# Patient Record
Sex: Female | Born: 1960
Health system: Southern US, Community
[De-identification: ages and names within clinical notes are randomized; demographics above are authoritative.]

## PROBLEM LIST (undated history)

## (undated) DIAGNOSIS — E7800 Pure hypercholesterolemia, unspecified: Secondary | ICD-10-CM

## (undated) DIAGNOSIS — K219 Gastro-esophageal reflux disease without esophagitis: Secondary | ICD-10-CM

## (undated) DIAGNOSIS — J45909 Unspecified asthma, uncomplicated: Secondary | ICD-10-CM

## (undated) DIAGNOSIS — M1990 Unspecified osteoarthritis, unspecified site: Secondary | ICD-10-CM

## (undated) DIAGNOSIS — E119 Type 2 diabetes mellitus without complications: Secondary | ICD-10-CM

## (undated) DIAGNOSIS — T7840XA Allergy, unspecified, initial encounter: Secondary | ICD-10-CM

## (undated) HISTORY — DX: Allergy, unspecified, initial encounter: T78.40XA

## (undated) HISTORY — DX: Unspecified asthma, uncomplicated: J45.909

## (undated) HISTORY — PX: CARPAL TUNNEL RELEASE: SHX101

## (undated) HISTORY — PX: BACK SURGERY: SHX140

## (undated) HISTORY — PX: KNEE SURGERY: SHX244

## (undated) HISTORY — DX: Gastro-esophageal reflux disease without esophagitis: K21.9

## (undated) HISTORY — PX: DILATION AND CURETTAGE, DIAGNOSTIC / THERAPEUTIC: SUR384

## (undated) HISTORY — PX: ANTERIOR CRUCIATE LIGAMENT REPAIR: SHX115

---

## 2011-10-22 ENCOUNTER — Other Ambulatory Visit (HOSPITAL_COMMUNITY): Payer: Self-pay | Admitting: Family Medicine

## 2011-10-22 DIAGNOSIS — Z1231 Encounter for screening mammogram for malignant neoplasm of breast: Secondary | ICD-10-CM

## 2011-10-23 ENCOUNTER — Other Ambulatory Visit (HOSPITAL_COMMUNITY): Payer: Self-pay | Admitting: Family Medicine

## 2011-11-18 ENCOUNTER — Ambulatory Visit (HOSPITAL_COMMUNITY): Payer: 59

## 2012-10-06 ENCOUNTER — Encounter (HOSPITAL_COMMUNITY): Payer: Self-pay | Admitting: *Deleted

## 2012-10-06 ENCOUNTER — Emergency Department (HOSPITAL_COMMUNITY)
Admission: EM | Admit: 2012-10-06 | Discharge: 2012-10-06 | Disposition: A | Discharge status: Home / Self Care | Payer: 59 | Source: Home / Self Care | Attending: Family Medicine | Admitting: Family Medicine

## 2012-10-06 DIAGNOSIS — S29012A Strain of muscle and tendon of back wall of thorax, initial encounter: Secondary | ICD-10-CM

## 2012-10-06 DIAGNOSIS — S239XXA Sprain of unspecified parts of thorax, initial encounter: Secondary | ICD-10-CM

## 2012-10-06 HISTORY — DX: Type 2 diabetes mellitus without complications: E11.9

## 2012-10-06 HISTORY — DX: Pure hypercholesterolemia, unspecified: E78.00

## 2012-10-06 MED ORDER — CYCLOBENZAPRINE HCL 5 MG PO TABS: 5.0000 mg | ORAL_TABLET | Freq: Three times a day (TID) | ORAL | Status: DC | PRN

## 2012-10-06 MED ORDER — KETOROLAC TROMETHAMINE 10 MG PO TABS: 10.0000 mg | ORAL_TABLET | Freq: Four times a day (QID) | ORAL | Status: DC | PRN

## 2012-10-06 NOTE — ED Provider Notes (Signed)
History     CSN: [id number]  Arrival date & time 10/06/12  1519   First MD Initiated Contact with Patient 10/06/12 1540      Chief Complaint  Patient presents with  . Back Pain    (Consider location/radiation/quality/duration/timing/severity/associated sxs/prior treatment) Patient is a 52 y.o. female presenting with back pain. The history is provided by the patient.  Back Pain Location:  Thoracic spine Quality:  Stabbing Radiates to:  Does not radiate Pain severity:  Moderate Onset quality:  Gradual (onset after working in yard over weekend cutting and pulling bushes etc. sx getting worse daily.) Progression:  Worsening Chronicity:  New Context: lifting heavy objects   Ineffective treatments:  NSAIDs Associated symptoms: no abdominal pain, no bladder incontinence, no fever, no numbness and no paresthesias     Past Medical History  Diagnosis Date  . Diabetes mellitus without complication   . Hypercholesteremia     Past Surgical History  Procedure Laterality Date  . Knee surgery    . Carpal tunnel release    . Back surgery      No family history on file.  History  Substance Use Topics  . Smoking status: Never Smoker   . Smokeless tobacco: Not on file  . Alcohol Use: Yes    OB History   Grav Para Term Preterm Abortions TAB SAB Ect Mult Living                  Review of Systems  Constitutional: Negative.  Negative for fever.  Gastrointestinal: Negative for abdominal pain.  Genitourinary: Negative for bladder incontinence.  Musculoskeletal: Positive for myalgias and back pain.  Skin: Negative.   Neurological: Negative for numbness and paresthesias.    Allergies  Review of patient's allergies indicates no known allergies.  Home Medications   Current Outpatient Rx  Name  Route  Sig  Dispense  Refill  . LISINOPRIL PO   Oral   Take by mouth.         . Loratadine (CLARITIN PO)   Oral   Take by mouth.         . METFORMIN HCL PO   Oral   Take  by mouth.         Marland Kitchen SIMVASTATIN PO   Oral   Take by mouth.         . cyclobenzaprine (FLEXERIL) 5 MG tablet   Oral   Take 1 tablet (5 mg total) by mouth 3 (three) times daily as needed for muscle spasms.   30 tablet   0   . ketorolac (TORADOL) 10 MG tablet   Oral   Take 1 tablet (10 mg total) by mouth every 6 (six) hours as needed for pain.   20 tablet   0     BP 102/59  Pulse 101  Temp(Src) 98.8 F (37.1 C) (Oral)  Resp 20  Physical Exam  Nursing note reviewed. Constitutional: She is oriented to person, place, and time. She appears well-developed and well-nourished.  Neck: Normal range of motion. Neck supple.  Musculoskeletal: She exhibits tenderness.       Arms: Neurological: She is alert and oriented to person, place, and time.  Skin: Skin is warm and dry.    ED Course  Procedures (including critical care time)  Labs Reviewed - No data to display No results found.   1. Strain of thoracic paraspinal muscles excluding T1 and T2 levels, initial encounter       MDM  Linna Hoff, MD 10/06/12 [phone]

## 2012-10-06 NOTE — ED Notes (Signed)
Pt  Reports    Mid  Back      Upper  Back  Pain     X  3    Days        -  Pt  States  Did  Some  Heavy  Yard  Work  Over  The  Weekend        denys  Any  specefic  Recent  Injury         Pt  Is  Worse  On movement and  posistion

## 2012-12-27 DIAGNOSIS — J4520 Mild intermittent asthma, uncomplicated: Secondary | ICD-10-CM | POA: Insufficient documentation

## 2013-01-02 ENCOUNTER — Other Ambulatory Visit (HOSPITAL_COMMUNITY): Payer: Self-pay | Admitting: Family Medicine

## 2013-01-03 ENCOUNTER — Other Ambulatory Visit (HOSPITAL_COMMUNITY): Payer: Self-pay | Admitting: Family Medicine

## 2013-01-03 DIAGNOSIS — R109 Unspecified abdominal pain: Secondary | ICD-10-CM

## 2013-01-03 DIAGNOSIS — R0781 Pleurodynia: Secondary | ICD-10-CM

## 2013-01-11 ENCOUNTER — Encounter (HOSPITAL_COMMUNITY)
Admission: RE | Admit: 2013-01-11 | Discharge: 2013-01-11 | Disposition: A | Discharge status: Home / Self Care | Payer: 59 | Source: Ambulatory Visit | Attending: Family Medicine | Admitting: Family Medicine

## 2013-01-11 DIAGNOSIS — R0781 Pleurodynia: Secondary | ICD-10-CM

## 2013-01-11 DIAGNOSIS — R109 Unspecified abdominal pain: Secondary | ICD-10-CM | POA: Insufficient documentation

## 2013-01-11 DIAGNOSIS — R079 Chest pain, unspecified: Secondary | ICD-10-CM | POA: Insufficient documentation

## 2013-01-11 MED ORDER — TECHNETIUM TC 99M MEDRONATE IV KIT
27.0000 | PACK | Freq: Once | INTRAVENOUS | Status: AC | PRN
  Administered 2013-01-11: 27 via INTRAVENOUS

## 2013-05-01 ENCOUNTER — Other Ambulatory Visit (HOSPITAL_COMMUNITY): Payer: Self-pay | Admitting: Obstetrics & Gynecology

## 2013-05-01 DIAGNOSIS — Z1231 Encounter for screening mammogram for malignant neoplasm of breast: Secondary | ICD-10-CM

## 2013-05-22 ENCOUNTER — Ambulatory Visit (HOSPITAL_COMMUNITY)
Admission: RE | Admit: 2013-05-22 | Discharge: 2013-05-22 | Disposition: A | Discharge status: Home / Self Care | Payer: 59 | Source: Ambulatory Visit | Attending: Obstetrics & Gynecology | Admitting: Obstetrics & Gynecology

## 2013-05-22 DIAGNOSIS — Z1231 Encounter for screening mammogram for malignant neoplasm of breast: Secondary | ICD-10-CM | POA: Insufficient documentation

## 2013-06-07 LAB — HM DIABETES EYE EXAM

## 2013-06-29 ENCOUNTER — Encounter: Payer: Self-pay | Admitting: *Deleted

## 2013-06-29 ENCOUNTER — Encounter: Payer: Self-pay | Admitting: Internal Medicine

## 2013-06-29 ENCOUNTER — Other Ambulatory Visit: Payer: Self-pay | Admitting: *Deleted

## 2013-06-29 ENCOUNTER — Ambulatory Visit (INDEPENDENT_AMBULATORY_CARE_PROVIDER_SITE_OTHER): Payer: 59 | Admitting: Internal Medicine

## 2013-06-29 VITALS — BP 118/68 | HR 103 | Temp 98.7°F | Resp 12 | Ht 63.5 in | Wt 239.0 lb

## 2013-06-29 DIAGNOSIS — IMO0001 Reserved for inherently not codable concepts without codable children: Secondary | ICD-10-CM

## 2013-06-29 DIAGNOSIS — E1169 Type 2 diabetes mellitus with other specified complication: Secondary | ICD-10-CM | POA: Insufficient documentation

## 2013-06-29 DIAGNOSIS — E1165 Type 2 diabetes mellitus with hyperglycemia: Secondary | ICD-10-CM | POA: Insufficient documentation

## 2013-06-29 DIAGNOSIS — E119 Type 2 diabetes mellitus without complications: Secondary | ICD-10-CM

## 2013-06-29 DIAGNOSIS — IMO0002 Reserved for concepts with insufficient information to code with codable children: Secondary | ICD-10-CM

## 2013-06-29 LAB — HEMOGLOBIN A1C: HEMOGLOBIN A1C: 11.1 % — AB (ref 4.6–6.5)

## 2013-06-29 MED ORDER — LIRAGLUTIDE 18 MG/3ML ~~LOC~~ SOPN: PEN_INJECTOR | SUBCUTANEOUS | Status: DC

## 2013-06-29 MED ORDER — INSULIN PEN NEEDLE 32G X 6 MM MISC: Status: DC

## 2013-06-29 NOTE — Progress Notes (Signed)
Patient ID: Angela Casey, female   DOB: Dec 17, 1960, 53 y.o.   MRN: [id number]  HPI: Angela Casey is a 53 y.o.-year-old female, referred by her PCP, Dr. Wilhemena Durie, for management of DM2, non-insulin-dependent, uncontrolled, without complications. She was seeing Dr Kalman [facility]), where she used to work. Now she works for [employer]  Patient has been diagnosed with diabetes in 2010; she has not been on insulin before. Last hemoglobin A1c was: - 12/27/2013: HbA1c 9.2% - previously: HbA1c 8.4%  Pt is on a regimen of: - Metformin (Glucophage XR) 1000 mg po bid She was advised to start Victoza, but she did not remember that she was told this...  Pt checks her sugars 3x a week and they are: - am: 220-240  - 2h after b'fast: n/c - before lunch: n/c - 2h after lunch: n/c  - before dinner: n/c - 2h after dinner: n/c - bedtime: high 200s - nighttime: n/c No lows. Lowest sugar was 182; she has hypoglycemia awareness at 80.  Highest sugar was 313 after Christmas dinner.  Pt's meals are: - Breakfast: nothing or nuts/yoghurt - Lunch: soup + salad, veggies + chicken - Dinner: soup + salad, veggies + meat + starch - Snacks: 0, usually She was taking nutrition classes before, will go back to education: [facility].  She tries to go to the gym 2x a week.  - no CKD, last BUN/creatinine: 14/0.73 on 12/2012. - no Lipid panel available for review. She is on Simvastatin.  - last eye exam was in 06/07/2013. No DR.  - no numbness and tingling in her feet.  Pt has FH of DM in father and PGM.  ROS: Constitutional: no weight gain/loss, + fatigue, no subjective hyperthermia/hypothermia, feels like in a fog. Eyes: no blurry vision, no xerophthalmia ENT: no sore throat, no nodules palpated in throat, no dysphagia/odynophagia, no hoarseness Cardiovascular: no CP/SOB/palpitations/leg swelling Respiratory: no cough/SOB Gastrointestinal: no N/V/+ D/+ C Musculoskeletal: no muscle/+ joint aches (knees) Skin:  no rashes Neurological: no tremors/numbness/tingling/dizziness Psychiatric: no depression/anxiety  Past Medical History  Diagnosis Date  . Diabetes mellitus without complication   . Hypercholesteremia    Past Surgical History  Procedure Laterality Date  . Knee surgery    . Carpal tunnel release    . Back surgery     History   Social History  . Marital Status: Divorced    Spouse Name: N/A    Number of Children: 0   Occupational History  . Asst Controller [place]   Social History Main Topics  . Smoking status: former Smoker, quit in 2005  . Smokeless tobacco: Not on file  . Alcohol Use: Yes  . Drug Use: No   Current Outpatient Prescriptions on File Prior to Visit  Medication Sig Dispense Refill  . Loratadine (CLARITIN PO) Take by mouth.       No current facility-administered medications on file prior to visit.   No Known Allergies Family History  Problem Relation Age of Onset  . Cancer Mother     uterine  . Diabetes Father   . COPD Father   . Diabetes Paternal Grandfather    PE: BP 118/68  Pulse 103  Temp(Src) 98.7 F (37.1 C) (Oral)  Resp 12  Ht 5' 3.5" (1.613 m)  Wt 239 lb (108.41 kg)  BMI 41.67 kg/m2  SpO2 98% Wt Readings from Last 3 Encounters:  06/29/13 239 lb (108.41 kg)   Constitutional: obese, in NAD, full [place] fat pads Eyes: PERRLA, EOMI, no exophthalmos  ENT: moist mucous membranes, no thyromegaly, no cervical lymphadenopathy Cardiovascular: RRR, No MRG Respiratory: CTA B Gastrointestinal: abdomen soft, NT, ND, BS+ Musculoskeletal: no deformities, strength intact in all 4 Skin: moist, warm, no rashes Neurological: no tremor with outstretched hands, DTR normal in all 4  ASSESSMENT: 1. DM2, non-insulin-dependent, uncontrolled, without complications  PLAN:  1. Patient with 5 year h/o DM2, recently more uncontrolled, on a limited oral antidiabetic regimen (only with Metformin), which became insufficient. - We discussed about options for  treatment, and I suggested to:  Patient Instructions  Please take all Metformin XR at dinner time. Start Victoza 0.6 mg daily in am >> after a week increase to 1.2 mg and after another week increase to 1.8 mg daily. I called this in. Start eating breakfast. Pack your snacks from home. Please return in 3 weeks with your sugar log.  Please stop at the lab. - I am not sure if Victoza will be enough to improve her sugars significantly, but if she starts working on her diet, this will likely work well - we discussed at length about healthy food changes and given some references and ideas about healthy food substitutions (see pt instr.). She will also start Wellness classes, and I believe this will develop and reinforce her food awareness - Strongly advised her to start checking sugars at different times of the day - check 1-2 times a day, rotating checks - given sugar log and advised how to fill it and to bring it at next appt  - given foot care handout and explained the principles  - given instructions for hypoglycemia management "15-15 rule"  - advised for yearly eye exams - she is up to date - Return to clinic in 1 mo with sugar log   Office Visit on 06/29/2013  Component Date Value Range Status  . HM Diabetic Eye Exam 06/07/2013 no DR   Final  . Hemoglobin A1C 06/29/2013 11.1* 4.6 - 6.5 % Final   Glycemic Control Guidelines for People with Diabetes:Non Diabetic:  <6%Goal of Therapy: <7%Additional Action Suggested:  >8%    HbA1c much increased >> if pt does not change her diet radically >> needs insulin at next visit

## 2013-06-29 NOTE — Patient Instructions (Signed)
Please take all Metformin XR at dinner time. Start Victoza 0.6 mg daily in am >> after a week increase to 1.2 mg and after another week increase to 1.8 mg daily. I called this in. Start eating breakfast. Pack your snacks from home. Please return in 3 weeks with your sugar log.  Please stop at the lab.   Please consider the following ways to cut down carbs and fat and increase fiber and micronutrients in your diet: - substitute whole grain for white bread or pasta - substitute brown rice for white rice - substitute 90-calorie flat bread pieces for slices of bread when possible - substitute sweet potatoes or yams for white potatoes - substitute humus for margarine - substitute tofu for cheese when possible - substitute almond or rice milk for regular milk (would not drink soy milk daily due to concern for soy estrogen influence on breast cancer risk) - substitute dark chocolate for other sweets when possible - substitute water - can add lemon or orange slices for taste - for diet sodas (artificial sweeteners will trick your body that you can eat sweets without getting calories and will lead you to overeating and weight gain in the long run) - do not skip breakfast or other meals (this will slow down the metabolism and will result in more weight gain over time)  - can try smoothies made from fruit and almond/rice milk in am instead of regular breakfast - can also try old-fashioned (not instant) oatmeal made with almond/rice milk in am - order the dressing on the side when eating salad at a restaurant (pour less than half of the dressing on the salad) - eat as little meat as possible - can try juicing, but should not forget that juicing will get rid of the fiber, so would alternate with eating raw veg./fruits or drinking smoothies - use as little oil as possible, even when using olive oil - can dress a salad with a mix of balsamic vinegar and lemon juice, for e.g. - use agave nectar, stevia  sugar, or regular sugar rather than artificial sweateners - steam or broil/roast veggies  - snack on veggies/fruit/nuts (unsalted, preferably) when possible, rather than processed foods - reduce or eliminate aspartame in diet (it is in diet sodas, chewing gum, etc) Read the labels!  Try to read Dr. Janene Harvey book: "Program for Reversing Diabetes" for the vegan concept and other ideas for healthy eating.  Plant-based diet materials:  - Lectures (you tube):  Alyssa Grove: "Breaking the Food Seduction"  Doug Lisle: "How to Lose Weight, without Losing Your Mind"  Shari Heritage: "What is Insulin Resistance" https://www.woods-mathews.com/ - Documentaries:  [place] over [employer], Sick and Nearly Dead  [employer] - Books:  Alyssa Grove: "Program for Reversing Diabetes"  Heath Gold: "The [place] Study"  Norma Fredrickson: "Supermarket Vegan" (cookbook) - Facebook pages:   Forks versus Knives  Vegucated  [place] [employer] - Healthy nutrition info websites:  https://www.martin.info/  Try to replace snacking on these with drinking/eating: * soy or almond milk * veggies with humus or other low calorie/low fat dip * low glycemic index fruits (higher glycemic index = higher risk to increase your sugars):          http://www.health.http://flores-mcbride.com/ * fruit/veggie smoothies          Ninja blender recipes:          https://www.moore-west.com/ * unsalted nuts Etc.

## 2013-06-29 NOTE — Addendum Note (Signed)
Addended by: Philemon Kingdom on: 06/29/2013 01:42 PM   Modules accepted: Level of Service

## 2013-07-02 ENCOUNTER — Ambulatory Visit (INDEPENDENT_AMBULATORY_CARE_PROVIDER_SITE_OTHER): Payer: 59 | Admitting: Internal Medicine

## 2013-07-02 VITALS — BP 124/78 | HR 111 | Temp 98.8°F | Resp 17 | Ht 64.5 in | Wt 242.0 lb

## 2013-07-02 DIAGNOSIS — Z6841 Body Mass Index (BMI) 40.0 and over, adult: Secondary | ICD-10-CM

## 2013-07-02 DIAGNOSIS — J22 Unspecified acute lower respiratory infection: Secondary | ICD-10-CM

## 2013-07-02 DIAGNOSIS — J45909 Unspecified asthma, uncomplicated: Secondary | ICD-10-CM

## 2013-07-02 DIAGNOSIS — J988 Other specified respiratory disorders: Secondary | ICD-10-CM

## 2013-07-02 MED ORDER — ALBUTEROL SULFATE HFA 108 (90 BASE) MCG/ACT IN AERS: 2.0000 | INHALATION_SPRAY | Freq: Four times a day (QID) | RESPIRATORY_TRACT | Status: DC | PRN

## 2013-07-02 MED ORDER — AZITHROMYCIN 250 MG PO TABS: ORAL_TABLET | ORAL | Status: DC

## 2013-07-02 MED ORDER — PREDNISONE 20 MG PO TABS: ORAL_TABLET | ORAL | Status: DC

## 2013-07-02 MED ORDER — HYDROCODONE-HOMATROPINE 5-1.5 MG/5ML PO SYRP: 5.0000 mL | ORAL_SOLUTION | Freq: Four times a day (QID) | ORAL | Status: DC | PRN

## 2013-07-02 NOTE — Progress Notes (Addendum)
Subjective:    Patient ID: Angela Casey, female    DOB: Jan 14, 1961, 53 y.o.   MRN: [id number]  HPI This chart was scribed for Tami Lin, MD by Thea Alken, ED Scribe. This patient was seen in room 5 and the patient's care was started at 5:28 PM.  HPI Comments: Angela Casey is a 53 y.o. female who presents to the [facility] complaining of a constant productive cough and wheezing onset 4 days. She reports that when she coughs and breaths she hears rattling in her chest. She reports yellow sputum with her cough and it worsens when taking deep breathes. She states that she has been taking mucinex and alka seltzer plus with minor relief to symptoms. She states that the cough keeps her awake at night. She has mild SOB, HA, fatigue, chest tightness and sinus pressure. She denies fevers, chills, ear pain, sore throat, congestion, post nasal drip. Pt states that she has h/o asthma but only uses her inhaler when exercising or sick.    Past Medical History  Diagnosis Date  . Diabetes mellitus without complication R6V 11 1 week ago   . Hypercholesteremia   . Allergy    No Known Allergies Prior to Admission medications   Medication Sig Start Date End Date Taking? Authorizing Provider  glucosamine-chondroitin 500-400 MG tablet Take 1 tablet by mouth daily.   Yes Historical Provider, MD  Insulin Pen Needle 32G X 6 MM MISC Use once a day 06/29/13  Yes Philemon Kingdom, MD  Liraglutide 18 MG/3ML SOPN Inject 1.8 mg daily in am under skin 06/29/13  Yes Philemon Kingdom, MD  Loratadine (CLARITIN PO) Take by mouth.   Yes Historical Provider, MD  losartan (COZAAR) 50 MG tablet Take 50 mg by mouth daily.   Yes Historical Provider, MD  metFORMIN (GLUCOPHAGE XR) 500 MG 24 hr tablet Take 1,000 mg by mouth 2 (two) times daily.   Yes Historical Provider, MD  simvastatin (ZOCOR) 20 MG tablet Take 20 mg by mouth daily.   Yes Historical Provider, MD    Review of Systems  Constitutional:  Negative for fever and chills.  HENT: Positive for sinus pressure. Negative for ear pain, postnasal drip and sore throat.   Respiratory: Positive for cough, chest tightness, shortness of breath and wheezing.   Gastrointestinal: Negative for nausea, vomiting and diarrhea.  Neurological: Positive for headaches.      Objective:   Physical Exam  Constitutional: She appears well-developed and well-nourished.  HENT:  Right Ear: Hearing and external ear normal. Tympanic membrane is erythematous.  Left Ear: Hearing and external ear normal. Left ear erythematous TM:    Mouth/Throat: Oropharynx is clear and moist.  Boggy turbs  Eyes: Conjunctivae and EOM are normal. Pupils are equal, round, and reactive to light.  Neck: No thyromegaly present.  Cardiovascular: Normal rate, regular rhythm, normal heart sounds and intact distal pulses.   No murmur heard. Pulmonary/Chest: She has wheezes.  bilat w/ forced expir  Lymphadenopathy:    She has no cervical adenopathy.   BP 124/78  Pulse 111  Temp(Src) 98.8 F (37.1 C) (Oral)  Resp 17  Ht 5' 4.5" (1.638 m)  Wt 242 lb (109.77 kg)  BMI 40.91 kg/m2  SpO2 95%  [facility] reading (PRIMARY) by  Dr.Mahrukh Seguin=NAD/poor inspir/obese       Assessment & Plan:  I have completed the patient encounter in its entirety as documented by the scribe, with editing by me where necessary. Sharyn Brilliant P. Laney Pastor, M.D.  1/ LRI w/ RAD-?underlying asthma Meds ordered this encounter  Medications  . predniSONE (DELTASONE) 20 MG tablet    Sig: 3/3/2/2/1/1/ single daily dose for 6 days    Dispense:  12 tablet    Refill:  0  . HYDROcodone-homatropine (HYCODAN) 5-1.5 MG/5ML syrup    Sig: Take 5 mLs by mouth every 6 (six) hours as needed for cough.    Dispense:  120 mL    Refill:  0  . azithromycin (ZITHROMAX) 250 MG tablet    Sig: As packaged    Dispense:  6 tablet    Refill:  0  . albuterol (PROVENTIL HFA;VENTOLIN HFA) 108 (90 BASE) MCG/ACT inhaler    Sig: Inhale 2  puffs into the lungs every 6 (six) hours as needed for wheezing or shortness of breath.    Dispense:  1 Inhaler    Refill:  5   Reck 10 days w/ PCP

## 2013-07-06 ENCOUNTER — Ambulatory Visit: Payer: 59

## 2013-07-06 ENCOUNTER — Ambulatory Visit (INDEPENDENT_AMBULATORY_CARE_PROVIDER_SITE_OTHER): Payer: 59 | Admitting: Family Medicine

## 2013-07-06 VITALS — BP 118/72 | HR 99 | Temp 98.0°F | Resp 18 | Ht 64.0 in | Wt 242.0 lb

## 2013-07-06 DIAGNOSIS — R059 Cough, unspecified: Secondary | ICD-10-CM

## 2013-07-06 DIAGNOSIS — R0600 Dyspnea, unspecified: Secondary | ICD-10-CM

## 2013-07-06 DIAGNOSIS — J45901 Unspecified asthma with (acute) exacerbation: Secondary | ICD-10-CM

## 2013-07-06 DIAGNOSIS — D72829 Elevated white blood cell count, unspecified: Secondary | ICD-10-CM

## 2013-07-06 DIAGNOSIS — Z8719 Personal history of other diseases of the digestive system: Secondary | ICD-10-CM

## 2013-07-06 DIAGNOSIS — R05 Cough: Secondary | ICD-10-CM

## 2013-07-06 DIAGNOSIS — R0609 Other forms of dyspnea: Secondary | ICD-10-CM

## 2013-07-06 DIAGNOSIS — R0989 Other specified symptoms and signs involving the circulatory and respiratory systems: Secondary | ICD-10-CM

## 2013-07-06 DIAGNOSIS — J9801 Acute bronchospasm: Secondary | ICD-10-CM

## 2013-07-06 LAB — POCT CBC
Granulocyte percent: 61.4 %G (ref 37–80)
HCT, POC: 42.8 % (ref 37.7–47.9)
Hemoglobin: 13.2 g/dL (ref 12.2–16.2)
Lymph, poc: 4.6 — AB (ref 0.6–3.4)
MCH, POC: 27.5 pg (ref 27–31.2)
MCHC: 30.8 g/dL — AB (ref 31.8–35.4)
MCV: 89.1 fL (ref 80–97)
MID (cbc): 1 — AB (ref 0–0.9)
MPV: 9.4 fL (ref 0–99.8)
POC Granulocyte: 8.9 — AB (ref 2–6.9)
POC LYMPH PERCENT: 31.7 %L (ref 10–50)
POC MID %: 6.9 % (ref 0–12)
Platelet Count, POC: 329 10*3/uL (ref 142–424)
RBC: 4.8 M/uL (ref 4.04–5.48)
RDW, POC: 16.5 %
WBC: 14.5 10*3/uL — AB (ref 4.6–10.2)

## 2013-07-06 MED ORDER — ALBUTEROL SULFATE (2.5 MG/3ML) 0.083% IN NEBU: 2.5000 mg | INHALATION_SOLUTION | RESPIRATORY_TRACT | Status: DC | PRN

## 2013-07-06 MED ORDER — OMEPRAZOLE 20 MG PO CPDR: 20.0000 mg | DELAYED_RELEASE_CAPSULE | Freq: Every day | ORAL | Status: DC

## 2013-07-06 MED ORDER — PREDNISONE 20 MG PO TABS: ORAL_TABLET | ORAL | Status: DC

## 2013-07-06 MED ORDER — IPRATROPIUM BROMIDE 0.02 % IN SOLN: 0.5000 mg | Freq: Once | RESPIRATORY_TRACT | Status: DC

## 2013-07-06 MED ORDER — ALBUTEROL SULFATE (2.5 MG/3ML) 0.083% IN NEBU: 2.5000 mg | INHALATION_SOLUTION | Freq: Once | RESPIRATORY_TRACT | Status: DC

## 2013-07-06 NOTE — Progress Notes (Addendum)
Subjective:  This chart was scribed for Merri Ray, MD by Roxan Diesel, ED scribe.  This patient was seen in [place] [facility] and the patient's care was started at 3:50 PM.   Patient ID: Angela Casey, female    DOB: 05-28-1961, 53 y.o.   MRN: [id number]  Chief Complaint  Patient presents with  . Follow-up    RAD-no better     HPI  Angela Casey is a 53 y.o. female  PCP: Cori Razor, MD  Pt presents for a f/u.  She was seen 4 days ago for a 4-day history of cough and wheezing and diagnosed with lower respiratory infection with RAD, and possible underlying asthma.  She was prescribed prednisone taper, Hycodan cough syrup, Z-pak, and albuterol inhaler.  Pt states that yesterday she was feeling better but today she has felt worse than when she was first seen.  She states she has had more coughing fits and has had worsened wheezing and "rattling" today.  She notes some SOB only with her coughing fits.  She denies fevers or chills.  She states she has been taking her Z-pak and prednisone as instructed.  She has been using her inhaler approximately every 2 hours, 1-2 puffs at a time.  She was not using it at all yesterday.  For the 3 days before that she was only using it around 2 times per day.  She has been using the Hycodan only at night.  She notes that her medications will be running out tomorrow.  She states that her sugars have been ranging from the 140s-150s recently.  She uses metformin and Victoza.  Pt has h/o acid reflux and does not take medications for this.  She denies recent issues with heartburn.  She has been told she has h/o heart murmur but denies h/o CHF, MI, stents, or other heart problems to her knowledge.  Hx of asthma in past - uses inhaler prior to exercise or with respiratory illness only.   Patient Active Problem List   Diagnosis Date Noted  . RAD (reactive airway disease) 07/02/2013  . BMI 40.0-44.9, adult 07/02/2013  . Type 2 diabetes mellitus, uncontrolled  06/29/2013    Past Medical History  Diagnosis Date  . Diabetes mellitus without complication   . Hypercholesteremia   . Allergy     Past Surgical History  Procedure Laterality Date  . Knee surgery    . Carpal tunnel release    . Back surgery      No Known Allergies   Prior to Admission medications   Medication Sig Start Date End Date Taking? Authorizing Provider  albuterol (PROVENTIL HFA;VENTOLIN HFA) 108 (90 BASE) MCG/ACT inhaler Inhale 2 puffs into the lungs every 6 (six) hours as needed for wheezing or shortness of breath. 07/02/13  Yes Leandrew Koyanagi, MD  azithromycin (ZITHROMAX) 250 MG tablet As packaged 07/02/13  Yes Leandrew Koyanagi, MD  glucosamine-chondroitin 500-400 MG tablet Take 1 tablet by mouth daily.   Yes Historical Provider, MD  HYDROcodone-homatropine (HYCODAN) 5-1.5 MG/5ML syrup Take 5 mLs by mouth every 6 (six) hours as needed for cough. 07/02/13  Yes Leandrew Koyanagi, MD  Insulin Pen Needle 32G X 6 MM MISC Use once a day 06/29/13  Yes Philemon Kingdom, MD  Liraglutide 18 MG/3ML SOPN Inject 1.8 mg daily in am under skin 06/29/13  Yes Philemon Kingdom, MD  Loratadine (CLARITIN PO) Take by mouth.   Yes Historical Provider, MD  losartan (COZAAR) 50 MG tablet  Take 50 mg by mouth daily.   Yes Historical Provider, MD  metFORMIN (GLUCOPHAGE XR) 500 MG 24 hr tablet Take 1,000 mg by mouth 2 (two) times daily.   Yes Historical Provider, MD  predniSONE (DELTASONE) 20 MG tablet 3/3/2/2/1/1/ single daily dose for 6 days 07/02/13  Yes Leandrew Koyanagi, MD  simvastatin (ZOCOR) 20 MG tablet Take 20 mg by mouth daily.   Yes Historical Provider, MD    History   Social History  . Marital Status: Divorced    Spouse Name: N/A    Number of Children: N/A  . Years of Education: N/A   Occupational History  . Not on file.   Social History Main Topics  . Smoking status: Never Smoker   . Smokeless tobacco: Not on file  . Alcohol Use: Yes  . Drug Use: No  . Sexual  Activity: No   Other Topics Concern  . Not on file   Social History Narrative  . No narrative on file     Review of Systems  Constitutional: Negative for fever and chills.  Respiratory: Positive for cough, shortness of breath (only with coughing fits) and wheezing.         Objective:   Physical Exam  Vitals reviewed. Constitutional: She is oriented to person, place, and time. She appears well-developed and well-nourished. No distress.  Overweight  HENT:  Head: Normocephalic and atraumatic.  Right Ear: Hearing, tympanic membrane, external ear and ear canal normal.  Left Ear: Hearing, tympanic membrane, external ear and ear canal normal.  Nose: Nose normal.  Mouth/Throat: Oropharynx is clear and moist. No oropharyngeal exudate.  Both frontal sinuses tender to palpation.  Maxillary sinuses nontender.  Eyes: Conjunctivae and EOM are normal. Pupils are equal, round, and reactive to light.  Cardiovascular: Normal rate, regular rhythm and intact distal pulses.   Murmur heard.  Systolic murmur is present with a grade of 2/6  2/6 systolic murmur LUSB  Pulmonary/Chest: Effort normal. No stridor. No respiratory distress. She has wheezes.  Expiratory wheeze, left lower greater than right lower. Somewhat distant breath sounds.  Neurological: She is alert and oriented to person, place, and time.  Skin: Skin is warm and dry. No rash noted.  Psychiatric: She has a normal mood and affect. Her behavior is normal.     Filed Vitals:   07/06/13 1510  BP: 118/72  Pulse: 99  Temp: 98 F (36.7 C)  TempSrc: Oral  Resp: 18  Height: 5\' 4"  (1.626 m)  Weight: 242 lb (109.77 kg)  SpO2: 96%   Results for orders placed in visit on 07/06/13  POCT CBC      Result Value Range   WBC 14.5 (*) 4.6 - 10.2 K/uL   Lymph, poc 4.6 (*) 0.6 - 3.4   POC LYMPH PERCENT 31.7  10 - 50 %L   MID (cbc) 1.0 (*) 0 - 0.9   POC MID % 6.9  0 - 12 %M   POC Granulocyte 8.9 (*) 2 - 6.9   Granulocyte percent 61.4   37 - 80 %G   RBC 4.80  4.04 - 5.48 M/uL   Hemoglobin 13.2  12.2 - 16.2 g/dL   HCT, POC 42.8  37.7 - 47.9 %   MCV 89.1  80 - 97 fL   MCH, POC 27.5  27 - 31.2 pg   MCHC 30.8 (*) 31.8 - 35.4 g/dL   RDW, POC 16.5     Platelet Count, POC 329  142 - 424  K/uL   MPV 9.4  0 - 99.8 fL  reviewed above. BNP pending. Verified that has not been having fevers recently.   [facility] reading (PRIMARY) by  Dr. Carlota Raspberry: CXR:  Few increased RLL markings without discrete infiltrate.   4:48 PM S/p albuterol 2.5mg  and atrovent 0.5mg  neb. Less cough. Still expiratory wheeze, more notable after neb. Good effort, still triggered cough with inspiration. Feels some improvement.   5:27 PM 2nd albuterol and atrovent neb given. Repeat exam with Dr. Linna Darner (as he may follow up pt tomorrow)  Good air exchange, prolonged exp phase, expiratory wheeze.      Assessment & Plan:   Angela Casey is a 53 y.o. female Dyspnea - Plan: POCT CBC, Brain natriuretic peptide, DG Chest 2 View  Cough - Plan: POCT CBC, albuterol (PROVENTIL) (2.5 MG/3ML) 0.083% nebulizer solution 2.5 mg, ipratropium (ATROVENT) nebulizer solution 0.5 mg, DG Chest 2 View, omeprazole (PRILOSEC) 20 MG capsule, DISCONTINUED: omeprazole (PRILOSEC) 20 MG capsule  Bronchospasm - Plan: POCT CBC, albuterol (PROVENTIL) (2.5 MG/3ML) 0.083% nebulizer solution 2.5 mg, ipratropium (ATROVENT) nebulizer solution 0.5 mg, albuterol (PROVENTIL) (2.5 MG/3ML) 0.083% nebulizer solution, DISCONTINUED: albuterol (PROVENTIL) (2.5 MG/3ML) 0.083% nebulizer solution  Asthma with acute exacerbation - Plan: albuterol (PROVENTIL) (2.5 MG/3ML) 0.083% nebulizer solution, predniSONE (DELTASONE) 20 MG tablet, DISCONTINUED: predniSONE (DELTASONE) 20 MG tablet, DISCONTINUED: albuterol (PROVENTIL) (2.5 MG/3ML) 0.083% nebulizer solution  Leukocytosis, unspecified - Plan: DG Chest 2 View  History of gastroesophageal reflux (GERD) - Plan: omeprazole (PRILOSEC) 20 MG capsule, DISCONTINUED: omeprazole  (PRILOSEC) 20 MG capsule  Suspected initial asthmatic bronchitis/resp infection with bronchospasm. Initially improved then worsened this am, after 40mg  prednisone dose last night. Will increase back to 60mg  qd for 2 days (already took 20mg  today), then taper back down. Risks of this discussed and stressed importance of watching home CBG's, but relatively stable this am. Afebrile, suspect elevated WBC from Prednisone. Some improvement in aeration with albuterol and atrovent nebs, so will Rx nebs for home Q4-6h prn (has nebulizer at home if needed).  Will check BNP, but doubt CHF. Start omeprazole 20mg  qd for GERD and possible LPR component. Recheck tomorrow with Dr. Linna Darner, or to ER overnight if worse.    *meds reprinted as pharmacy closed.  Meds ordered this encounter  Medications  . albuterol (PROVENTIL) (2.5 MG/3ML) 0.083% nebulizer solution 2.5 mg    Sig:   . ipratropium (ATROVENT) nebulizer solution 0.5 mg    Sig:   . DISCONTD: predniSONE (DELTASONE) 20 MG tablet    Sig: 2 tabs by mouth once today, 3 tabs by mouth once tomorrow, 2 tabs by mouth each day for 2 days, then 1 tab by mouth each day for 2 days.    Dispense:  11 tablet    Refill:  0  . DISCONTD: omeprazole (PRILOSEC) 20 MG capsule    Sig: Take 1 capsule (20 mg total) by mouth daily.    Dispense:  30 capsule    Refill:  3  . DISCONTD: albuterol (PROVENTIL) (2.5 MG/3ML) 0.083% nebulizer solution    Sig: Take 3 mLs (2.5 mg total) by nebulization every 4 (four) hours as needed for wheezing or shortness of breath.    Dispense:  75 mL    Refill:  0    2.5mg /30ml nebules. #25, no refill.  Marland Kitchen albuterol (PROVENTIL) (2.5 MG/3ML) 0.083% nebulizer solution    Sig: Take 3 mLs (2.5 mg total) by nebulization every 4 (four) hours as needed for wheezing or shortness of breath.  Dispense:  75 mL    Refill:  0    2.5mg /10ml nebules. #25, no refill.  . predniSONE (DELTASONE) 20 MG tablet    Sig: 2 tabs by mouth once today, 3 tabs by mouth  once tomorrow, 2 tabs by mouth each day for 2 days, then 1 tab by mouth each day for 2 days.    Dispense:  11 tablet    Refill:  0  . omeprazole (PRILOSEC) 20 MG capsule    Sig: Take 1 capsule (20 mg total) by mouth daily.    Dispense:  30 capsule    Refill:  1   Patient Instructions  Increase prednisone temporarily as discussed. Keep a close eye on your blood sugars as this is increased.  Albuterol nebs every 4 hours if needed.  If needed sooner or any worsening - go to the emergency room. Start omeprazole as discussed. recheck with Dr. Linna Darner tomorrow.   Return to the clinic or go to the nearest emergency room if any of your symptoms worsen or new symptoms occur.       I personally performed the services described in this documentation, which was scribed in my presence. The recorded information has been reviewed and considered, and addended by me as needed.

## 2013-07-06 NOTE — Patient Instructions (Signed)
Increase prednisone temporarily as discussed. Keep a close eye on your blood sugars as this is increased.  Albuterol nebs every 4 hours if needed.  If needed sooner or any worsening - go to the emergency room. Start omeprazole as discussed. recheck with Dr. Linna Darner tomorrow.   Return to the clinic or go to the nearest emergency room if any of your symptoms worsen or new symptoms occur.

## 2013-07-07 ENCOUNTER — Ambulatory Visit (INDEPENDENT_AMBULATORY_CARE_PROVIDER_SITE_OTHER): Payer: 59 | Admitting: Family Medicine

## 2013-07-07 VITALS — BP 120/78 | HR 75 | Temp 98.0°F | Resp 16 | Ht 64.0 in | Wt 242.0 lb

## 2013-07-07 DIAGNOSIS — Z111 Encounter for screening for respiratory tuberculosis: Secondary | ICD-10-CM

## 2013-07-07 DIAGNOSIS — J45909 Unspecified asthma, uncomplicated: Secondary | ICD-10-CM

## 2013-07-07 LAB — BRAIN NATRIURETIC PEPTIDE: Brain Natriuretic Peptide: 64.1 pg/mL (ref 0.0–100.0)

## 2013-07-07 NOTE — Addendum Note (Signed)
Addended by: Kyra Manges on: 07/07/2013 11:51 AM   Modules accepted: Orders

## 2013-07-07 NOTE — Progress Notes (Signed)
Subjective: Feels much better than yesterday. She asked questions about the medications. She reportedly has an attack of this periodically maybe once a year this bad. She has a nebulizer machine at home and she used that a little. She is moving air well today. She still has a lot of congestion and we talked about that.  Objective: Pleasant alert lady in no distress this morning. Neck supple without nodes. Chest is clear just listening on basic respirations.When she takes forced expiration she has rattling from phlegm in her chest and prolonged expiratory wheeze. Continues to cough some  Assessment:  Asthmatic bronchitis much improved  Plan: Continue current medications and return for one followup visit to discuss whether she needs longer-term maintenance medications

## 2013-07-07 NOTE — Patient Instructions (Signed)
Drink plenty of fluid   Continue current medications as discussed  Return in one week for recheck and discussion of whether you need long-term maintenance medications

## 2013-07-25 ENCOUNTER — Ambulatory Visit: Payer: 59 | Admitting: Internal Medicine

## 2013-07-27 ENCOUNTER — Encounter: Payer: Self-pay | Admitting: Internal Medicine

## 2013-07-27 ENCOUNTER — Ambulatory Visit (INDEPENDENT_AMBULATORY_CARE_PROVIDER_SITE_OTHER): Payer: 59 | Admitting: Internal Medicine

## 2013-07-27 VITALS — BP 112/78 | HR 90 | Temp 97.5°F | Resp 12 | Wt 228.8 lb

## 2013-07-27 DIAGNOSIS — IMO0001 Reserved for inherently not codable concepts without codable children: Secondary | ICD-10-CM

## 2013-07-27 DIAGNOSIS — E1165 Type 2 diabetes mellitus with hyperglycemia: Secondary | ICD-10-CM

## 2013-07-27 DIAGNOSIS — IMO0002 Reserved for concepts with insufficient information to code with codable children: Secondary | ICD-10-CM

## 2013-07-27 MED ORDER — LIRAGLUTIDE 18 MG/3ML ~~LOC~~ SOPN: PEN_INJECTOR | SUBCUTANEOUS | Status: DC

## 2013-07-27 MED ORDER — SIMVASTATIN 20 MG PO TABS: 20.0000 mg | ORAL_TABLET | Freq: Every day | ORAL | Status: DC

## 2013-07-27 MED ORDER — LOSARTAN POTASSIUM 50 MG PO TABS: 50.0000 mg | ORAL_TABLET | Freq: Every day | ORAL | Status: DC

## 2013-07-27 NOTE — Patient Instructions (Signed)
Stay on the current regimen. Congratulations! Keep up the great work!

## 2013-07-27 NOTE — Progress Notes (Signed)
Patient ID: Angela Casey, female   DOB: 1960/12/11, 53 y.o.   MRN: [id number]  HPI: Angela Casey is a 53 y.o.-year-old female, initially referred by her PCP, Dr. Wilhemena Durie, for management of DM2, dx 2010, non-insulin-dependent, uncontrolled, without complications. Last visit 1 mo ago.  In the last month >> he had a Prednisone taper - finished on 02/05.  Last hemoglobin A1c was: Lab Results  Component Value Date   HGBA1C 11.1* 06/29/2013  - 12/27/2013: HbA1c 9.2% - previously: HbA1c 8.4%  Pt is on a regimen of: - Metformin (Glucophage XR) 1000 mg po bid - Victoza 1.8 mg daily >> added at last visit >> developed N/V/D at the 1.8 mg dose  Pt checks her sugars 2-3x a week and they are radically improved - pt changed her diet including almost entirely plants and little meat and cheese. She started to pack her meals from home, too, and does not eat out anymore: - am: 220-240 >> 112-170 (most 130-140s) - 2h after b'fast: n/c - before lunch: n/c >> 147-169 - 2h after lunch: n/c  - before dinner: n/c >> 107-168 - 2h after dinner: n/c - bedtime: high 200s >> 104-154 - nighttime: n/c No lows. Lowest sugar was 70; she has hypoglycemia awareness at 80.  Highest sugar was   [facility] was taking [facility] classes [facility]  She tries to go to the gym 2x a week.  - no CKD, last BUN/creatinine: 14/0.73 on 12/2012. - no Lipid panel available for review. She is on Simvastatin.  - last eye exam was in 06/07/2013. No DR.  - no numbness and tingling in her feet.  I reviewed pt's medications, allergies, PMH, social hx, family hx and no changes required, except as mentioned above.  ROS: Constitutional: no weight gain/loss, + fatigue, no subjective hyperthermia/hypothermia, feels like in a fog. Eyes: no blurry vision, no xerophthalmia ENT: no sore throat, no nodules palpated in throat, no dysphagia/odynophagia, no hoarseness Cardiovascular: no CP/SOB/palpitations/leg  swelling Respiratory: no cough/SOB Gastrointestinal: no N/V/+ D/no C Musculoskeletal: no muscle/+ joint aches (knees) Skin: no rashes Neurological: no tremors/numbness/tingling/dizziness  PE: BP 112/78  Pulse 90  Temp(Src) 97.5 F (36.4 C) (Oral)  Resp 12  Wt 228 lb 12.8 oz (103.783 kg)  SpO2 97% Wt Readings from Last 3 Encounters:  07/27/13 228 lb 12.8 oz (103.783 kg)  07/07/13 242 lb (109.77 kg)  07/06/13 242 lb (109.77 kg)   Constitutional: obese, in NAD, full [place] fat pads Eyes: PERRLA, EOMI, no exophthalmos ENT: moist mucous membranes, no thyromegaly, no cervical lymphadenopathy Cardiovascular: RRR, No MRG Respiratory: CTA B Gastrointestinal: abdomen soft, NT, ND, BS+ Musculoskeletal: no deformities, strength intact in all 4 Skin: moist, warm, no rashes Foot exam performed today   ASSESSMENT: 1. DM2, non-insulin-dependent, uncontrolled, without complications  PLAN:  1. Patient with 5 year h/o DM2, much improved after improving her diet and she also lost 14 lbs in last month with help of Victoza! - We discussed about options for treatment, and I suggested to stay on Victoza, but go back few clicks to see if N/V/D improves. She was doing well on the 1.2 mg, so we can go back to that dose, if needed - refilled Victoza - continue checking sugars at different times of the day - check 1-2 times a day, rotating checks - advised for yearly eye exams - she is up to date - refilled Simvastatin and Cozaar >> she had labs by PCP in 12/2012  and will bring the records at next visit. - Return to clinic in 3 mo with sugar log

## 2013-08-12 ENCOUNTER — Encounter: Payer: 59 | Attending: Internal Medicine

## 2013-08-12 VITALS — Ht 62.0 in | Wt 229.0 lb

## 2013-08-12 DIAGNOSIS — E119 Type 2 diabetes mellitus without complications: Secondary | ICD-10-CM | POA: Insufficient documentation

## 2013-08-12 DIAGNOSIS — Z713 Dietary counseling and surveillance: Secondary | ICD-10-CM | POA: Insufficient documentation

## 2013-08-12 DIAGNOSIS — IMO0002 Reserved for concepts with insufficient information to code with codable children: Secondary | ICD-10-CM

## 2013-08-12 DIAGNOSIS — E1165 Type 2 diabetes mellitus with hyperglycemia: Secondary | ICD-10-CM

## 2013-08-12 NOTE — Progress Notes (Signed)
Patient was seen on 08/12/13 for the complete diabetes self-management series at the [facility].  Current A1c = 11.1  Handouts given during class include:  Living Well with Diabetes book  Carb Counting and Meal Planning book  Meal Plan Card  Carbohydrate guide  Meal planning worksheet  Low Sodium Flavoring Tips  The diabetes portion plate  Low Carbohydrate Snack Suggestions  A1c to eAG Conversion Chart  Diabetes Medications  Stress Management  Diabetes Recommended Care Schedule  Diabetes Success Plan  Core Class Satisfaction Survey  The following learning objectives were met by the patient during this course:  Describe diabetes  State some common risk factors for diabetes  Defines the role of glucose and insulin  Identifies type of diabetes and pathophysiology  Describe the relationship between diabetes and cardiovascular risk  State the members of the Healthcare Team  States the rationale for glucose monitoring  State when to test glucose  State their individual Target Range  State the importance of logging glucose readings  Describe how to interpret glucose readings  Identifies A1C target  Explain the correlation between A1c and eAG values  State symptoms and treatment of high blood glucose  State symptoms and treatment of low blood glucose  Explain proper technique for glucose testing  Identifies proper sharps disposal  Describe the role of different macronutrients on glucose  Explain how carbohydrates affect blood glucose  State what foods contain the most carbohydrates  Demonstrate carbohydrate counting  Demonstrate how to read Nutrition Facts food label  Describe effects of various fats on heart health  Describe the importance of good nutrition for health and healthy eating strategies  Describe techniques for managing your shopping, cooking and meal planning  List strategies to follow meal plan  when dining out  Describe the effects of alcohol on glucose and how to use it safely   State the amount of activity recommended for healthy living   Describe activities suitable for individual needs   Identify ways to regularly incorporate activity into daily life   Identify barriers to activity and ways to over come these barriers  Identify diabetes medications being personally used and their primary action for lowering glucose and possible side effects   Describe role of stress on blood glucose and develop strategies to address psychosocial issues   Identify diabetes complications and ways to prevent them  Explain how to manage diabetes during illness   Evaluate success in meeting personal goal   Establish 2-3 goals that they will plan to diligently work on until they return for the  64-month follow-up visit  Goals:  Follow Diabetes Meal Plan as instructed  Eat 3 meals and 2 snacks, every 3-5 hrs  Limit carbohydrate intake to 45 grams carbohydrate/meal Limit carbohydrate intake to 15 grams carbohydrate/snack Add lean protein foods to meals/snacks  Monitor glucose levels as instructed by your doctor  Aim for 15-30 mins of physical activity daily as tolerated  Bring food record and glucose log to your next nutrition visit  Your patient has established the following 4 month goals in their individualized success plan: Reduce the fat in my diet at two or more meals per day I will increase my activity level at least 4 days a week   Your patient has identified these potential barriers to change:  none  Your patient has identified their diabetes self-care support plan as  family

## 2013-09-20 ENCOUNTER — Encounter: Payer: Self-pay | Admitting: Internal Medicine

## 2013-09-25 ENCOUNTER — Encounter: Payer: Self-pay | Admitting: Internal Medicine

## 2013-10-24 ENCOUNTER — Encounter: Payer: Self-pay | Admitting: Internal Medicine

## 2013-10-24 ENCOUNTER — Ambulatory Visit (INDEPENDENT_AMBULATORY_CARE_PROVIDER_SITE_OTHER): Payer: 59 | Admitting: Internal Medicine

## 2013-10-24 VITALS — BP 104/68 | HR 97 | Temp 98.6°F | Resp 12 | Wt 228.0 lb

## 2013-10-24 DIAGNOSIS — Z1329 Encounter for screening for other suspected endocrine disorder: Secondary | ICD-10-CM

## 2013-10-24 DIAGNOSIS — E1165 Type 2 diabetes mellitus with hyperglycemia: Secondary | ICD-10-CM

## 2013-10-24 DIAGNOSIS — IMO0002 Reserved for concepts with insufficient information to code with codable children: Secondary | ICD-10-CM

## 2013-10-24 DIAGNOSIS — IMO0001 Reserved for inherently not codable concepts without codable children: Secondary | ICD-10-CM

## 2013-10-24 LAB — TSH: TSH: 1.27 u[IU]/mL (ref 0.35–4.50)

## 2013-10-24 LAB — T4, FREE: Free T4: 0.73 ng/dL (ref 0.60–1.60)

## 2013-10-24 LAB — T3, FREE: T3, Free: 3.1 pg/mL (ref 2.3–4.2)

## 2013-10-24 MED ORDER — CANAGLIFLOZIN 100 MG PO TABS: ORAL_TABLET | ORAL | Status: DC

## 2013-10-24 MED ORDER — GLUCOSE BLOOD VI STRP: ORAL_STRIP | Status: DC

## 2013-10-24 MED ORDER — CANAGLIFLOZIN 300 MG PO TABS: ORAL_TABLET | ORAL | Status: DC

## 2013-10-24 NOTE — Progress Notes (Signed)
Patient ID: Angela Casey, female   DOB: [birth year], 53 y.o.   MRN: [id number]  HPI: Angela Casey is a 53 y.o.-year-old female, initially referred by her PCP, Dr. Wilhemena Durie, for management of DM2, dx 2010, non-insulin-dependent, uncontrolled, without complications. Last visit 3 mo ago.  Last hemoglobin A1c was: 09/25/2013: HbA1c 8.3% Lab Results  Component Value Date   HGBA1C 11.1* 06/29/2013  - 12/27/2013: HbA1c 9.2% - previously: HbA1c 8.4%  Pt is on a regimen of: - Metformin (Glucophage XR) 1000 mg po bid She stopped Victoza 1.8 mg daily >> developed N/D/AP  Pt checks her sugars 2-3x a day: - am: 220-240 >> 112-170 (most 130-140s) >> 182-270 - 2h after b'fast: n/c - before lunch: n/c >> 147-169 >> 140 - 2h after lunch: n/c  - before dinner: n/c >> 107-168 >> 164-226 - 2h after dinner: n/c - bedtime: high 200s >> 104-154 >> 175-248 - nighttime: n/c No lows. Lowest sugar was 140 since last visit; she has hypoglycemia awareness at 80.  Highest sugar was 400 x1.  She was taking nutrition classes before[facility]  She cut down meat by 80%! She tries to go to the gym 2x a week.  - no CKD, last BUN/creatinine: 14/0.73 on 12/2012. - no Lipid panel available for review. She is on Simvastatin.  - last eye exam was in 06/07/2013. No DR.  - no numbness and tingling in her feet. Foot exam performed 07/2013.  I reviewed pt's medications, allergies, PMH, social hx, family hx and no changes required, except as mentioned above.  ROS: Constitutional: no weight gain/loss, no fatigue, no subjective hyperthermia/hypothermia Eyes: no blurry vision, no xerophthalmia ENT: no sore throat, no nodules palpated in throat, no dysphagia/odynophagia, no hoarseness Cardiovascular: no CP/SOB/palpitations/leg swelling Respiratory: no cough/SOB Gastrointestinal: had N with Victoza/no V/had D with Victoza/no C Musculoskeletal: no muscle/ joint aches Skin: no rashes Neurological:  no tremors/numbness/tingling/dizziness  PE: BP 104/68  Pulse 97  Temp(Src) 98.6 F (37 C) (Oral)  Resp 12  Wt 228 lb (103.42 kg)  SpO2 97% Wt Readings from Last 3 Encounters:  10/24/13 228 lb (103.42 kg)  08/12/13 229 lb (103.874 kg)  07/27/13 228 lb 12.8 oz (103.783 kg)   Constitutional: obese, in NAD, full  fat pads Eyes: PERRLA, EOMI, no exophthalmos ENT: moist mucous membranes, no thyromegaly, no cervical lymphadenopathy Cardiovascular: RRR, No MRG Respiratory: CTA B Gastrointestinal: abdomen soft, NT, ND, BS+ Musculoskeletal: no deformities, strength intact in all 4 Skin: moist, warm, no rashes  ASSESSMENT: 1. DM2, non-insulin-dependent, uncontrolled, without complications  PLAN:  1. Patient with 5 year h/o DM2, worsened again after stopping Victoza due to GI sxs. - We discussed about options for treatment, and I suggested: Patient Instructions  Please stop at the lab. Please start Invokana 100 mg in am. After a week, increase to 300 mg daily in am. Continue Metformin XR 1000 mg 2x a day with meals. Please return in 1.5 month with your sugar log.  - we discussed about SEs of Invokana, which are: dizziness (advised to be careful when stands from sitting position), decreased BP - usually not < normal (BP today is not low), and fungal UTIs (advised to let me know if develops one).  - continue checking sugars at different times of the day - check 1-2 times a day, rotating checks - advised for yearly eye exams - she is up to date - check TFTs per her request - Return to clinic in  1.5 mo with sugar log    Office Visit on 10/24/2013  Component Date Value Ref Range Status  . TSH 10/24/2013 1.27  0.35 - 4.50 uIU/mL Final  . T3, Free 10/24/2013 3.1  2.3 - 4.2 pg/mL Final  . Free T4 10/24/2013 0.73  0.60 - 1.60 ng/dL Final  TFTs normal.

## 2013-10-24 NOTE — Patient Instructions (Signed)
Please stop at the lab. Please start Invokana 100 mg in am. After a week, increase to 300 mg daily in am. Continue Metformin XR 1000 mg 2x a day with meals.  Please return in 1.5 month with your sugar log.

## 2013-12-05 ENCOUNTER — Encounter: Payer: Self-pay | Admitting: Internal Medicine

## 2013-12-05 ENCOUNTER — Ambulatory Visit (INDEPENDENT_AMBULATORY_CARE_PROVIDER_SITE_OTHER): Payer: 59 | Admitting: Internal Medicine

## 2013-12-05 VITALS — BP 114/68 | HR 85 | Temp 98.2°F | Resp 12 | Wt 227.0 lb

## 2013-12-05 DIAGNOSIS — IMO0002 Reserved for concepts with insufficient information to code with codable children: Secondary | ICD-10-CM

## 2013-12-05 DIAGNOSIS — E1165 Type 2 diabetes mellitus with hyperglycemia: Secondary | ICD-10-CM

## 2013-12-05 DIAGNOSIS — IMO0001 Reserved for inherently not codable concepts without codable children: Secondary | ICD-10-CM

## 2013-12-05 LAB — BASIC METABOLIC PANEL
BUN: 16 mg/dL (ref 6–23)
CALCIUM: 9.1 mg/dL (ref 8.4–10.5)
CO2: 25 mEq/L (ref 19–32)
Chloride: 102 mEq/L (ref 96–112)
Creatinine, Ser: 0.8 mg/dL (ref 0.4–1.2)
GFR: 75.78 mL/min (ref >60.00)
GLUCOSE: 168 mg/dL — AB (ref 70–99)
POTASSIUM: 4 meq/L (ref 3.5–5.1)
Sodium: 136 mEq/L (ref 135–145)

## 2013-12-05 LAB — HEMOGLOBIN A1C: Hgb A1c MFr Bld: 9.1 % — ABNORMAL HIGH (ref 4.6–6.5)

## 2013-12-05 NOTE — Patient Instructions (Addendum)
Please continue Metformin XR 1000 mg 2x a day. Continue Invokana 300 mg daily in am.  Please stop at the lab.

## 2013-12-05 NOTE — Progress Notes (Signed)
Patient ID: Angela Casey, female   DOB: 15-Oct-1960, 53 y.o.   MRN: [id number]  HPI: Angela Casey is a 53 y.o.-year-old female, initially referred by her PCP, Dr. Wilhemena Durie, for management of DM2, dx 2010, non-insulin-dependent, uncontrolled, without complications. Last visit 1 mo ago.  Last hemoglobin A1c was: 52 09/25/2013: HbA1c 8.3% Lab Results  Component Value Date   HGBA1C 11.1* 06/29/2013  - 12/27/2013: HbA1c 9.2% - previously: HbA1c 8.4%  Pt is on a regimen of: - Metformin (Glucophage XR) 1000 mg po bid - Invokana added 10/2013  She stopped Victoza 1.8 mg daily >> developed N/D/AP  Pt checks her sugars 2-3x a day: - am: 220-240 >> 112-170 (most 130-140s) >> 182-270 >> 118-169 (180) - 2h after b'fast: n/c - before lunch: n/c >> 147-169 >> 140 >> n/c - 2h after lunch: n/c  - before dinner: n/c >> 107-168 >> 164-226 >> 106-120 - 2h after dinner: n/c - bedtime: high 200s >> 104-154 >> 175-248 >> 139-156 - nighttime: n/c No lows. Lowest sugar was 106 since last visit; she has hypoglycemia awareness at 80.  Highest sugar was 400 x1 >> 190.  She was taking nutrition classes before, [facility]  She tries to go to the gym 2x a week.  She lost ~13 lbs in the last 2 months!  - no CKD, last BUN/creatinine: 14/0.73 on 12/2012. On Losartan. - no Lipid panel available for review. She is on Simvastatin.  - last eye exam was in 06/07/2013. No DR.  - no numbness and tingling in her feet. Foot exam performed 07/2013.  I reviewed pt's medications, allergies, PMH, social hx, family hx and no changes required, except as mentioned above.  ROS: Constitutional: no weight gain/loss, no fatigue, no subjective hyperthermia/hypothermia, + increased urination Eyes: no blurry vision, no xerophthalmia ENT: no sore throat, no nodules palpated in throat, no dysphagia/odynophagia, no hoarseness Cardiovascular: no CP/SOB/palpitations/leg swelling Respiratory: no  cough/SOB Gastrointestinal: no N/no V/no D/no C Musculoskeletal: no muscle/ joint aches Skin: no rashes Neurological: no tremors/numbness/tingling/dizziness  PE: BP 114/68  Pulse 85  Temp(Src) 98.2 F (36.8 C) (Oral)  Resp 12  Wt 227 lb (102.967 kg)  SpO2 97% Wt Readings from Last 3 Encounters:  12/05/13 227 lb (102.967 kg)  10/24/13 228 lb (103.42 kg)  08/12/13 229 lb (103.874 kg)   Constitutional: obese, in NAD, full [place] fat pads Eyes: PERRLA, EOMI, no exophthalmos ENT: moist mucous membranes, no thyromegaly, no cervical lymphadenopathy Cardiovascular: RRR, No MRG Respiratory: CTA B Gastrointestinal: abdomen soft, NT, ND, BS+ Musculoskeletal: no deformities, strength intact in all 4 Skin: moist, warm, no rashes  ASSESSMENT: 1. DM2, non-insulin-dependent, uncontrolled, without complications  PLAN:  1. Patient with 5 year h/o DM2, much improved after addition of Invokana - We discussed about options for treatment, and I suggested: Patient Instructions  Please continue Metformin XR 1000 mg 2x a day. Continue Invokana 300 mg daily in am. Please stop at the lab. - no SEs from Invokana - continue checking sugars at different times of the day - check 2 times a day, rotating checks - advised for yearly eye exams - she is up to date - will check BMP (as she is on invokana and Losartan) and HbA1c (per her request) - Return to clinic in 3 mo with sugar log   Office Visit on 12/05/2013  Component Date Value Ref Range Status  . Hemoglobin A1C 12/05/2013 9.1* 4.6 - 6.5 % Final   Glycemic Control  Guidelines for People with Diabetes:Non Diabetic:  <6%Goal of Therapy: <7%Additional Action Suggested:  >8%   . Sodium 12/05/2013 136  135 - 145 mEq/L Final  . Potassium 12/05/2013 4.0  3.5 - 5.1 mEq/L Final  . Chloride 12/05/2013 102  96 - 112 mEq/L Final  . CO2 12/05/2013 25  19 - 32 mEq/L Final  . Glucose, Bld 12/05/2013 168* 70 - 99 mg/dL Final  . BUN 12/05/2013 16  6 - 23 mg/dL  Final  . Creatinine, Ser 12/05/2013 0.8  0.4 - 1.2 mg/dL Final  . Calcium 12/05/2013 9.1  8.4 - 10.5 mg/dL Final  . GFR 12/05/2013 75.78  >60.00 mL/min Final   Normal K and BUN/Cr >> can continue Invokana. HbA1c worse, but the effects on Invokana started only ~2 weeks ago.

## 2014-01-23 ENCOUNTER — Ambulatory Visit (INDEPENDENT_AMBULATORY_CARE_PROVIDER_SITE_OTHER): Payer: Self-pay | Admitting: Family Medicine

## 2014-01-23 ENCOUNTER — Other Ambulatory Visit: Payer: Self-pay

## 2014-01-23 VITALS — BP 108/68 | HR 84 | Wt 229.0 lb

## 2014-01-23 DIAGNOSIS — E119 Type 2 diabetes mellitus without complications: Secondary | ICD-10-CM

## 2014-01-23 MED ORDER — METFORMIN HCL ER 500 MG PO TB24
1000.0000 mg | ORAL_TABLET | Freq: Two times a day (BID) | ORAL | Status: DC
Start: 2014-01-23 — End: 2014-03-08

## 2014-01-23 NOTE — Progress Notes (Signed)
Patient presents today for annual pharmacy consult as part of the employer-sponsored [facility] program. Current diabetes regimen includes Metformin and Invokana. Patient also continues on daily ARB and statin. Most recent MD follow-up was late June with endocrinologist Dr. Cruzita Lederer. Patient has a pending appt for October 1st for follow-up after recent med change. At most recent visit, MD discontinued Victoza due to GI intolerance and started Invokana. No major health changes at this time. Patient has a good understanding of medications and diabetes regimen, we have discussed any questions and concerns.  Diabetes Assessment: Type of Diabetes: Type 2; Sees Diabetes provider 4 or more times per year; MD managing Diabetes Dr.Gherghe; hypoglycemia frequency none; does not take an aspirin a day; checks feet daily; What is target A1c? 7.0 %; What are the signs of hyperglycemia? High blood glucose fatigue, brain fog; uses glucometer Free style lite and True Result Has rx at pharmacy; Diabetes Education 3/7 completed class at [facility]; checks blood glucose 2 times a day; Highest CBG 230; Lowest CBG 140; A1c 9.0 in late June via MD office.  Other Diabetes History: Current med regimen includes Metformin ER 500 mg 4 tablets daily, and Invokana 300 mg daily. Patient reports compliance, but refill history reveals otherwise. Patient admits to missing a dose on occassion and she did skip Invokana for an entire week while she was really busy at work. I have reminded her of the importance of medication compliance. She picked up all refills today. Patient did not bring meter or glucose log today but is currently testing 2 times per day. Glucose monitoring occurs fasting, after work/before supper, and when symptomatic. Per patient report glucose averages 160-180. Hypoglycemia frequency is rare. Patient reports some signs of changing sensation in right leg and foot, but this is likely residual  damage from past ruptured disc and disc repair. Patient reports the ability to relieve sensation with changes in position. We have discussed risk of neuropathy in diabetics and especially those with elevated A1c and patient will monitor for changing signs and symptoms of neuropathy. No issues with slow healing wounds or infection. Patient is up to date on yearly eye exam.  Hypertension: takes an ACE/ARB Yes; BP self monitoring Yes; Other Hypertension History: Patient is currently taking losartan. She was previously on ACEi (lisinopril) but developed a cough, ACEi was discontinued, cough resolved. Patient is tolerating losartan well and BP is under good control.  Lifestyle Factors: Diet - Patient reports several positive dietary habits, but admits that there are areas of diet in need of improvement. Patient has indulged in icecream and milkshakes on a regular basis this summer and is aware of the need to limit this. She continues using a weight watcher plate every night for supper, and attempts to monitor portion sizes. Exercise - No routine exercise at this time due to hectic work schedule during summer budget season. Patient has worked long hours during the past summer months and often doesnot leave work until 6-7 pm. She continues walking sporadically during the week but would like to get back on track with more regular exercise once schedule allows. She continues to hold a [employer] and plans to resume once schedule is back to normal.   Assessment: Patient presents today for medication review. She has a good understanding of medications. She is working toward a healthier lifestyle including diet and exercise goals. Patient does need improvement in medication compliance. A1c has improved, but remains well above goal of <7.0. Patient  will follow-up with Lenna Sciara, CCM in September, and with endocrinologist, Dr. Cruzita Lederer in October.  Plan: 1) Continue to be compliant with medications 2) Continue to  make improvements to diet and be aware of portion sizes, and limit sweets, espcially icecream 3) Attempt to resume exercise, with eventual goal of 150 minutes per week 4) Follow-up with Melissa in Sept and Dr. Cruzita Lederer in October

## 2014-01-26 ENCOUNTER — Other Ambulatory Visit: Payer: Self-pay | Admitting: Internal Medicine

## 2014-01-26 DIAGNOSIS — E1165 Type 2 diabetes mellitus with hyperglycemia: Secondary | ICD-10-CM

## 2014-01-26 DIAGNOSIS — IMO0002 Reserved for concepts with insufficient information to code with codable children: Secondary | ICD-10-CM

## 2014-03-08 ENCOUNTER — Ambulatory Visit (INDEPENDENT_AMBULATORY_CARE_PROVIDER_SITE_OTHER): Payer: 59 | Admitting: Internal Medicine

## 2014-03-08 ENCOUNTER — Encounter: Payer: Self-pay | Admitting: Internal Medicine

## 2014-03-08 ENCOUNTER — Other Ambulatory Visit: Payer: Self-pay | Admitting: *Deleted

## 2014-03-08 VITALS — BP 118/74 | HR 99 | Temp 98.1°F | Resp 12 | Wt 231.0 lb

## 2014-03-08 DIAGNOSIS — IMO0002 Reserved for concepts with insufficient information to code with codable children: Secondary | ICD-10-CM

## 2014-03-08 DIAGNOSIS — E1165 Type 2 diabetes mellitus with hyperglycemia: Secondary | ICD-10-CM

## 2014-03-08 LAB — LIPID PANEL
Cholesterol: 110 mg/dL (ref 0–200)
HDL: 28.4 mg/dL — AB (ref >39.00)
LDL Cholesterol: 48 mg/dL (ref 0–99)
NonHDL: 81.6
Total CHOL/HDL Ratio: 4
Triglycerides: 168 mg/dL — ABNORMAL HIGH (ref 0.0–149.0)
VLDL: 33.6 mg/dL (ref 0.0–40.0)

## 2014-03-08 LAB — HEMOGLOBIN A1C: Hgb A1c MFr Bld: 9.7 % — ABNORMAL HIGH (ref 4.6–6.5)

## 2014-03-08 MED ORDER — SIMVASTATIN 20 MG PO TABS: 20.0000 mg | ORAL_TABLET | Freq: Every day | ORAL | Status: DC

## 2014-03-08 MED ORDER — METFORMIN HCL ER 500 MG PO TB24: 1000.0000 mg | ORAL_TABLET | Freq: Two times a day (BID) | ORAL | Status: DC

## 2014-03-08 MED ORDER — LOSARTAN POTASSIUM 50 MG PO TABS: 50.0000 mg | ORAL_TABLET | Freq: Every day | ORAL | Status: DC

## 2014-03-08 MED ORDER — GLIPIZIDE ER 10 MG PO TB24: 10.0000 mg | ORAL_TABLET | Freq: Every day | ORAL | Status: DC

## 2014-03-08 MED ORDER — CANAGLIFLOZIN 300 MG PO TABS: ORAL_TABLET | ORAL | Status: DC

## 2014-03-08 NOTE — Patient Instructions (Signed)
Please continue: - Metformin (Glucophage XR) 1000 mg 2x a day - Invokana 300 mg daily in am Add: - Glipizide XL 10 mg daily in am.  Please stop at the lab.  Please return in 1.5 months with your sugar log.

## 2014-03-08 NOTE — Progress Notes (Signed)
Patient ID: Angela Casey, female   DOB: 04-13-1961, 53 y.o.   MRN: [id number]  HPI: Angela Casey is a 53 y.o.-year-old female, initially referred by her PCP, Dr. Wilhemena Durie, for management of DM2, dx 2010, non-insulin-dependent, uncontrolled, without complications. Last visit 3 mo ago.  She c/o long working hours and stress at work - tearful during the appt.  Last hemoglobin A1c was: Lab Results  Component Value Date   HGBA1C 9.1* 12/05/2013   HGBA1C 11.1* 06/29/2013  - 09/25/2013: HbA1c 8.3% - 12/27/2012: HbA1c 9.2% - previously: HbA1c 8.4%  Pt is on a regimen of: - Metformin (Glucophage XR) 1000 mg po bid - Invokana added 10/2013 - may forget - this improved lately She stopped Victoza 1.8 mg daily >> developed N/D/AP  Pt checks her sugars 2-3x a day: - am: 220-240 >> 112-170 (most 130-140s) >> 182-270 >> 118-169 (180) >> 4 am: 112-150s, then 7 am: 160-225 - 2h after b'fast: n/c - before lunch: n/c >> 147-169 >> 140 >> n/c >> 139-162 - 2h after lunch: n/c  - before dinner: n/c >> 107-168 >> 164-226 >> 106-120 >> 118-198, 210 - 2h after dinner: n/c - bedtime: high 200s >> 104-154 >> 175-248 >> 139-156, 210 - nighttime: n/c No lows. Lowest sugar was 106 since last visit; she has hypoglycemia awareness at 80.  Highest sugar was 400 x1 >> 190 >> 200s.  She was taking nutrition classes before, [facility]  She tries to go to the gym 2x a week.  She lost ~13 lbs in the last 2 months!  - no CKD, last BUN/creatinine:  Lab Results  Component Value Date   BUN 16 12/05/2013   Lab Results  Component Value Date   CREATININE 0.8 12/05/2013  On Losartan. - no Lipid panel available for review.  She is on Simvastatin.  - last eye exam was in 06/07/2013. No DR.  - no numbness and tingling in her feet. Foot exam performed 07/2013.  I reviewed pt's medications, allergies, PMH, social hx, family hx and no changes required, except as mentioned  above.  ROS: Constitutional: no weight gain/loss, no fatigue, no subjective hyperthermia/hypothermia, + increased urination Eyes: no blurry vision, no xerophthalmia ENT: no sore throat, no nodules palpated in throat, no dysphagia/odynophagia, no hoarseness Cardiovascular: no CP/SOB/palpitations/leg swelling Respiratory: no cough/SOB Gastrointestinal: no N/no V/no D/no C Musculoskeletal: no muscle/ joint aches Skin: no rashes Neurological: no tremors/numbness/tingling/dizziness  PE: BP 118/74  Pulse 99  Temp(Src) 98.1 F (36.7 C) (Oral)  Resp 12  Wt 231 lb (104.781 kg)  SpO2 97% Wt Readings from Last 3 Encounters:  03/08/14 231 lb (104.781 kg)  01/23/14 229 lb (103.874 kg)  12/05/13 227 lb (102.967 kg)   Constitutional: obese, in NAD, full [place] fat pads Eyes: PERRLA, EOMI, no exophthalmos ENT: moist mucous membranes, no thyromegaly, no cervical lymphadenopathy Cardiovascular: RRR, No MRG Respiratory: CTA B Gastrointestinal: abdomen soft, NT, ND, BS+ Musculoskeletal: no deformities, strength intact in all 4 Skin: moist, warm, no rashes  ASSESSMENT: 1. DM2, non-insulin-dependent, uncontrolled, without complications  PLAN:  1. Patient with 5 year h/o DM2, much improved after addition of Invokana, but worsened in last 3 mo. A lot of stress at work, some overeating. - We discussed about options for treatment, and I suggested: Patient Instructions  Please continue Metformin XR 1000 mg 2x a day. Continue Invokana 300 mg daily in am. Start Glipizide XL 10 mg in am Please stop at the lab. -  we can add Januvia at next visit if needed. - no SEs from [place] - continue checking sugars at different times of the day - check 2 times a day, rotating checks - advised for yearly eye exams - she is up to date - will check A1c and Lipids - Return to clinic in 1.5 mo with sugar log   Office Visit on 03/08/2014  Component Date Value Ref Range Status  . Hemoglobin A1C 03/08/2014 9.7*  4.6 - 6.5 % Final   Glycemic Control Guidelines for People with Diabetes:Non Diabetic:  <6%Goal of Therapy: <7%Additional Action Suggested:  >8%   . Cholesterol 03/08/2014 110  0 - 200 mg/dL Final   ATP III Classification       Desirable:  < 200 mg/dL               Borderline High:  200 - 239 mg/dL          High:  > = 240 mg/dL  . Triglycerides 03/08/2014 168.0* 0.0 - 149.0 mg/dL Final   Normal:  <150 mg/dLBorderline High:  150 - 199 mg/dL  . HDL 03/08/2014 28.40* >39.00 mg/dL Final  . VLDL 03/08/2014 33.6  0.0 - 40.0 mg/dL Final  . LDL Cholesterol 03/08/2014 48  0 - 99 mg/dL Final  . Total CHOL/HDL Ratio 03/08/2014 4   Final                  Men          Women1/2 Average Risk     3.4          3.3Average Risk          5.0          4.42X Average Risk          9.6          7.13X Average Risk          15.0          11.0                      . NonHDL 03/08/2014 81.60   Final   NOTE:  Non-HDL goal should be 30 mg/dL higher than patient's LDL goal (i.e. LDL goal of < 70 mg/dL, would have non-HDL goal of < 100 mg/dL)   HbA1c higher. LDL at goal.

## 2014-03-13 IMAGING — CR DG CHEST 2V
2 series · 2 of 2 positions shown · non-contrast
Comparison: None.

CLINICAL DATA: Cough and wheezing

EXAM:
CHEST  2 VIEW

[PA]
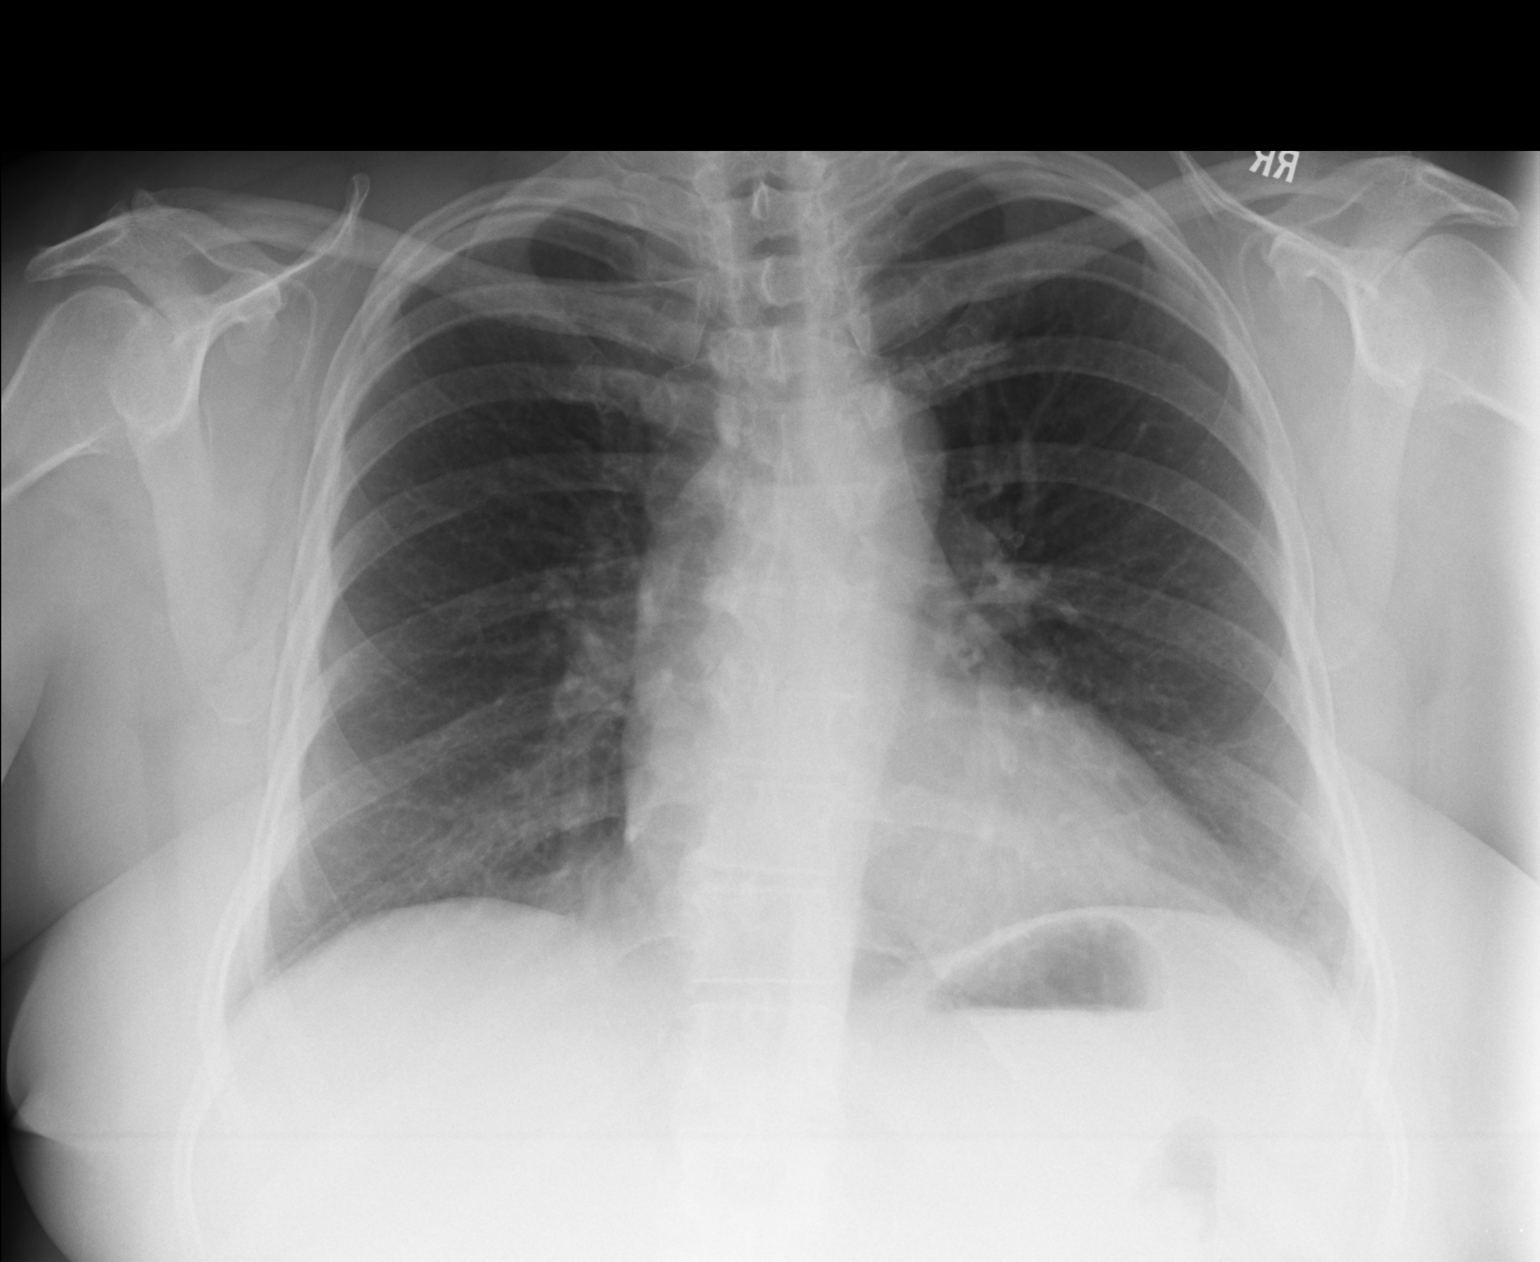

[lateral]
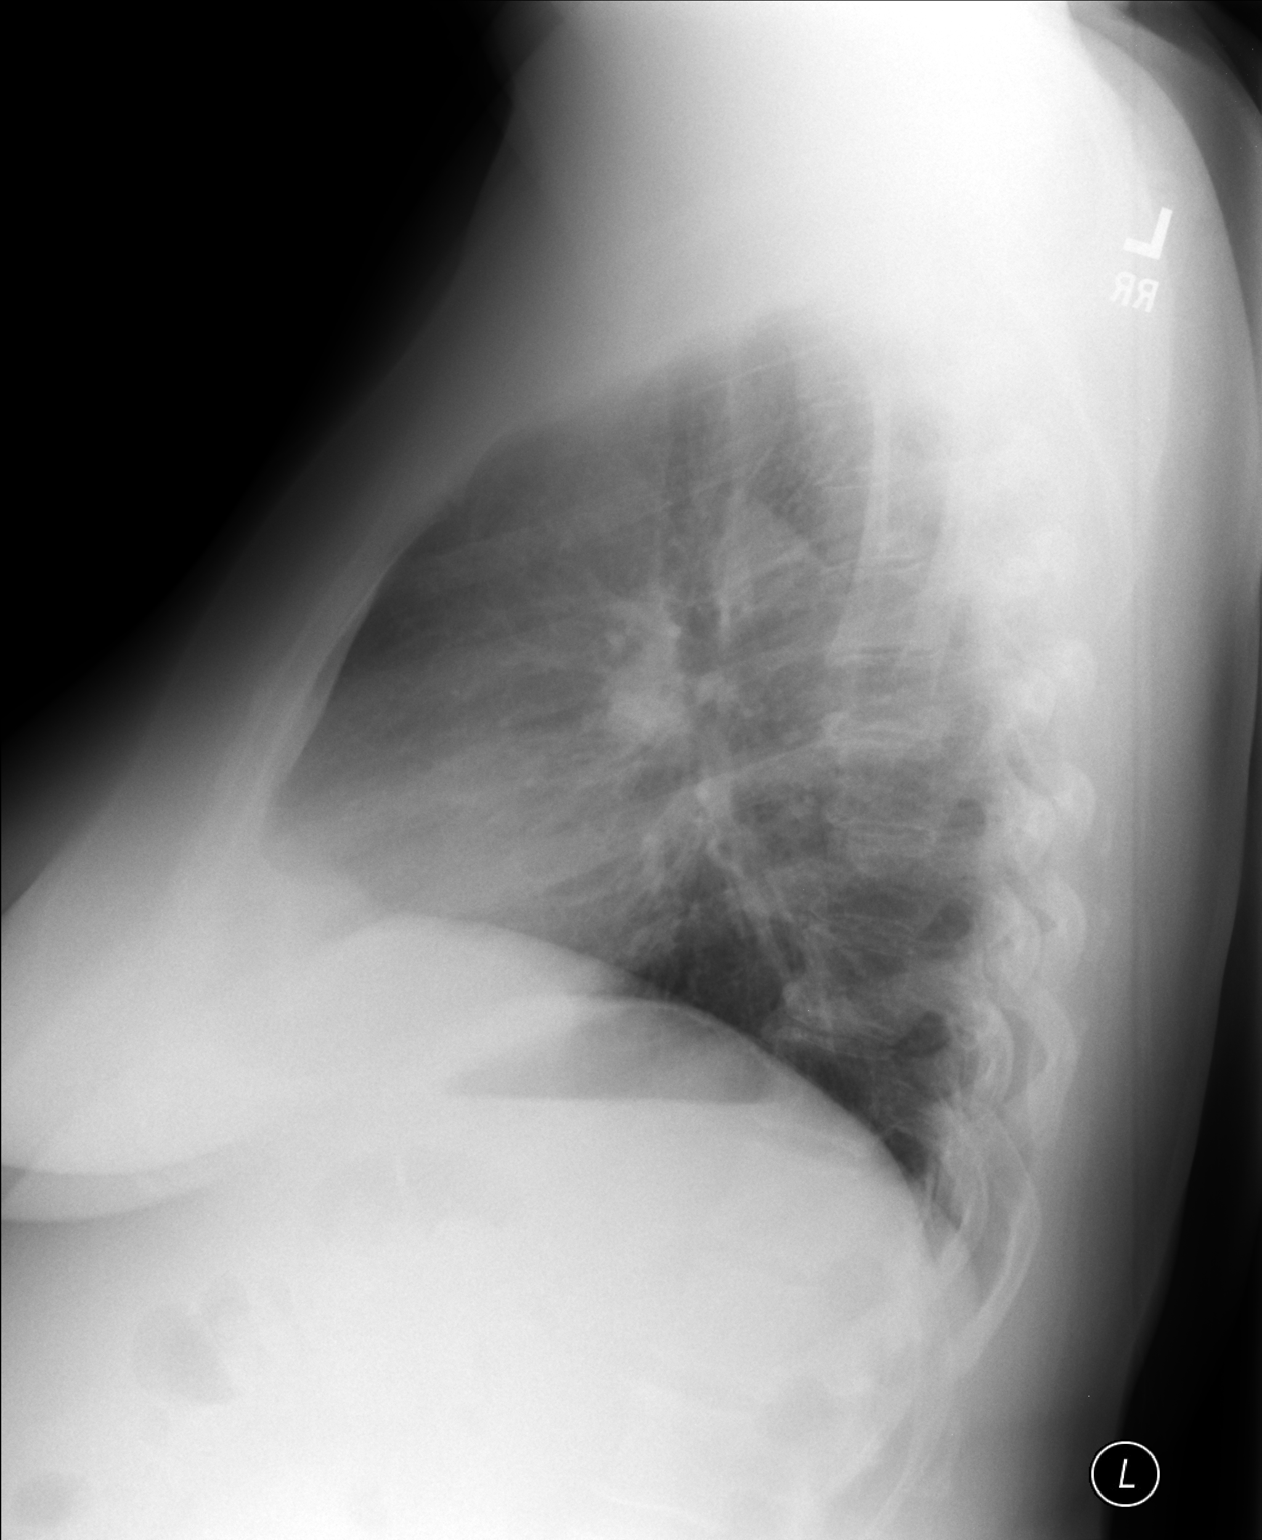

[2 of 2 positions shown; findings below may reference images not displayed]

FINDINGS: Cardiac shadow is within normal limits. The lungs are well aerated
bilaterally. Slight increased density is noted in the medial aspect
of the right lung base which may represent some very mild middle
lobe atelectasis. No other focal infiltrate is seen. No bony
abnormality is seen.
IMPRESSION: Likely mild right middle lobe atelectasis.

## 2014-04-05 ENCOUNTER — Encounter: Payer: Self-pay | Admitting: Family Medicine

## 2014-04-05 NOTE — Progress Notes (Signed)
Patient ID: Angela Casey, female   DOB: 15-May-1961, 53 y.o.   MRN: [id number] Reviewed: Agree with the documentation and management of our [place].

## 2014-04-24 ENCOUNTER — Other Ambulatory Visit (HOSPITAL_COMMUNITY): Payer: Self-pay | Admitting: Family Medicine

## 2014-04-24 DIAGNOSIS — Z1231 Encounter for screening mammogram for malignant neoplasm of breast: Secondary | ICD-10-CM

## 2014-04-25 ENCOUNTER — Ambulatory Visit: Payer: 59 | Admitting: Internal Medicine

## 2014-04-25 ENCOUNTER — Encounter: Payer: Self-pay | Admitting: Internal Medicine

## 2014-04-25 ENCOUNTER — Ambulatory Visit (INDEPENDENT_AMBULATORY_CARE_PROVIDER_SITE_OTHER): Payer: 59 | Admitting: Internal Medicine

## 2014-04-25 VITALS — BP 112/78 | HR 89 | Temp 98.3°F | Resp 12 | Wt 229.0 lb

## 2014-04-25 DIAGNOSIS — IMO0002 Reserved for concepts with insufficient information to code with codable children: Secondary | ICD-10-CM

## 2014-04-25 DIAGNOSIS — E1165 Type 2 diabetes mellitus with hyperglycemia: Secondary | ICD-10-CM

## 2014-04-25 NOTE — Progress Notes (Signed)
Patient ID: Angela Casey, female   DOB: 29-Jun-1960, 53 y.o.   MRN: [id number]  HPI: Angela Casey is a 53 y.o.-year-old female, initially referred by her PCP, Dr. Wilhemena Durie, for management of DM2, dx 2010, non-insulin-dependent, uncontrolled, without complications. Last visit 1.5 mo ago.  Last hemoglobin A1c was: Lab Results  Component Value Date   HGBA1C 9.7* 03/08/2014   HGBA1C 9.1* 12/05/2013   HGBA1C 11.1* 06/29/2013  - 09/25/2013: HbA1c 8.3% - 12/27/2012: HbA1c 9.2% - previously: HbA1c 8.4%  Pt is on a regimen of: - Metformin (Glucophage XR) 1000 mg po bid - Invokana 300 mg added 10/2013 - may forget - this improved lately - Glipizide XL 10 mg in am  - started 03/2014 She stopped Victoza 1.8 mg daily >> developed N/D/AP  Pt checks her sugars 2-3x a day >> better: - am: 220-240 >> 112-170 (most 130-140s) >> 182-270 >> 118-169 (180) >> 4 am: 112-150s, then 7 am: 160-225 >> 118-130, 142 - 2h after b'fast: n/c >> n/c - before lunch: n/c >> 147-169 >> 140 >> n/c >> 139-162 >> 61 x1, 115-125 - 2h after lunch: n/c >>  - before dinner: n/c >> 107-168 >> 164-226 >> 106-120 >> 118-198, 210 >> 115-125 - 2h after dinner: n/c - bedtime: high 200s >> 104-154 >> 175-248 >> 139-156, 210 >> up tp 160, 170, 198 x1 (after eating out) - nighttime: n/c No lows. Lowest sugar was 106 since last visit; she has hypoglycemia awareness at 80.  Highest sugar was 400 x1 >> 190 >> 200s >> 198  She was taking nutrition classes before, [facility].  She tries to go to the gym 2x a week.  - no CKD, last BUN/creatinine:  Lab Results  Component Value Date   BUN 16 12/05/2013   Lab Results  Component Value Date   CREATININE 0.8 12/05/2013  On Losartan. - no Lipid panel: Lab Results  Component Value Date   CHOL 110 03/08/2014   HDL 28.40* 03/08/2014   LDLCALC 48 03/08/2014   TRIG 168.0* 03/08/2014   CHOLHDL 4 03/08/2014  She is on Simvastatin.  - last eye exam was in  06/07/2013. No DR.  - no numbness and tingling in her feet. Foot exam performed 07/2013.  I reviewed pt's medications, allergies, PMH, social hx, family hx and no changes required, except as mentioned above.  ROS: Constitutional: no weight gain/loss, no fatigue, no subjective hyperthermia/hypothermia, no increased urination Eyes: no blurry vision, no xerophthalmia ENT: no sore throat, no nodules palpated in throat, no dysphagia/odynophagia, no hoarseness Cardiovascular: no CP/SOB/palpitations/leg swelling Respiratory: no cough/SOB Gastrointestinal: no N/no V/no D/no C Musculoskeletal: no muscle/ joint aches Skin: no rashes Neurological: no tremors/numbness/tingling/dizziness  PE: BP 112/78 mmHg  Pulse 89  Temp(Src) 98.3 F (36.8 C) (Oral)  Resp 12  Wt 229 lb (103.874 kg)  SpO2 97% Wt Readings from Last 3 Encounters:  04/25/14 229 lb (103.874 kg)  03/08/14 231 lb (104.781 kg)  01/23/14 229 lb (103.874 kg)   Constitutional: obese, in NAD, full [place] fat pads Eyes: PERRLA, EOMI, no exophthalmos ENT: moist mucous membranes, no thyromegaly, no cervical lymphadenopathy Cardiovascular: RRR, No MRG Respiratory: CTA B Gastrointestinal: abdomen soft, NT, ND, BS+ Musculoskeletal: no deformities, strength intact in all 4 Skin: moist, warm, no rashes  ASSESSMENT: 1. DM2, non-insulin-dependent, uncontrolled, without complications  PLAN:  1. Patient with 5 year h/o DM2, much improved after addition of Glipizide. She is doing better and losing weight! -  We discussed about options for treatment, and I suggested: Patient Instructions  Please continue Metformin XR 1000 mg 2x a day. Continue Invokana 300 mg daily in am. Continue Glipizide XL 10 mg in am Keep up the great work! - no SEs from [employer] - continue checking sugars at different times of the day - check 2 times a day, rotating checks - advised for yearly eye exams - she is up to date - Return to clinic in 3 mo with sugar log

## 2014-04-25 NOTE — Patient Instructions (Signed)
Please continue Metformin XR 1000 mg 2x a day. Continue Invokana 300 mg daily in am. Continue Glipizide XL 10 mg in am Keep up the great work! Please return in 3 months with your sugar log.

## 2014-05-11 ENCOUNTER — Ambulatory Visit (INDEPENDENT_AMBULATORY_CARE_PROVIDER_SITE_OTHER): Payer: 59 | Admitting: Internal Medicine

## 2014-05-11 VITALS — BP 134/78 | HR 94 | Temp 98.5°F | Resp 18 | Ht 64.0 in | Wt 233.8 lb

## 2014-05-11 DIAGNOSIS — J4520 Mild intermittent asthma, uncomplicated: Secondary | ICD-10-CM

## 2014-05-11 DIAGNOSIS — J0100 Acute maxillary sinusitis, unspecified: Secondary | ICD-10-CM

## 2014-05-11 MED ORDER — AMOXICILLIN 500 MG PO CAPS: 1000.0000 mg | ORAL_CAPSULE | Freq: Two times a day (BID) | ORAL | Status: AC

## 2014-05-11 MED ORDER — HYDROCODONE-HOMATROPINE 5-1.5 MG/5ML PO SYRP: 5.0000 mL | ORAL_SOLUTION | Freq: Four times a day (QID) | ORAL | Status: DC | PRN

## 2014-05-11 NOTE — Progress Notes (Signed)
   Subjective:    Patient ID: Angela Casey, female    DOB: April 18, 1961, 53 y.o.   MRN: [id number]  This chart was scribed for Tami Lin, MD by Stephania Fragmin, ED Scribe. This patient was seen in room 1 and the patient's care was started at 1:41 PM.   HPI   HPI Comments: Angela Casey is a 53 y.o. female who presents to the [facility] complaining of a left-sided itchy throat, ear irritation, and nasal congestion that began about 3 days ago. She has a history of bad head colds that turn into bronchitis because she has asthma -- she had similar symptoms in January of this year that resulted in bronchitis -- and she would like to prevent the progression of illness before it happens. Patient says that her symptoms usually turn into coughing and wheezing after a few days. She uses an inhaler only when cleaning out the cat litter box and has Albuterol in her nebulizer. She denies a nighttime cough at this time.   Active Ambulatory Problems    Diagnosis Date Noted  . Type 2 diabetes mellitus, uncontrolled 06/29/2013  . RAD (reactive airway disease) 07/02/2013  . BMI 40.0-44.9, adult 07/02/2013   Resolved Ambulatory Problems    Diagnosis Date Noted  . No Resolved Ambulatory Problems   Past Medical History  Diagnosis Date  . Diabetes mellitus without complication   . Hypercholesteremia   . Allergy   . Asthma   . GERD (gastroesophageal reflux disease)        Review of Systems noncotr     Objective:   Physical Exam  Constitutional: She is oriented to person, place, and time. She appears well-developed and well-nourished. No distress.  HENT:  Head: Normocephalic and atraumatic.  Mouth/Throat: Oropharynx is clear and moist.  TMs are dull.  Nares with purulent mucus. Throat clear.   Eyes: Conjunctivae and EOM are normal.  Neck: Neck supple.  Cardiovascular: Normal rate.   Pulmonary/Chest: Effort normal and breath sounds normal. No respiratory distress.    Chest clear to auscultation.   Musculoskeletal: Normal range of motion.  Neurological: She is alert and oriented to person, place, and time.  Skin: Skin is warm and dry.  Psychiatric: She has a normal mood and affect. Her behavior is normal.  Nursing note and vitals reviewed.         Assessment & Plan:   I have completed the patient encounter in its entirety as documented by the scribe, with editing by me where necessary. Rut Betterton P. Laney Pastor, M.D. RAD (reactive airway disease), mild intermittent, uncomplicated  Acute maxillary sinusitis, recurrence not specified  Meds ordered this encounter  Medications  . amoxicillin (AMOXIL) 500 MG capsule    Sig: Take 2 capsules (1,000 mg total) by mouth 2 (two) times daily.    Dispense:  40 capsule    Refill:  0  . HYDROcodone-homatropine (HYCODAN) 5-1.5 MG/5ML syrup    Sig: Take 5 mLs by mouth every 6 (six) hours as needed.    Dispense:  120 mL    Refill:  0

## 2014-05-24 ENCOUNTER — Ambulatory Visit (HOSPITAL_COMMUNITY)
Admission: RE | Admit: 2014-05-24 | Discharge: 2014-05-24 | Disposition: A | Discharge status: Home / Self Care | Payer: 59 | Source: Ambulatory Visit | Attending: Family Medicine | Admitting: Family Medicine

## 2014-05-24 DIAGNOSIS — Z1231 Encounter for screening mammogram for malignant neoplasm of breast: Secondary | ICD-10-CM | POA: Insufficient documentation

## 2014-07-25 LAB — HM DIABETES EYE EXAM

## 2014-07-26 ENCOUNTER — Ambulatory Visit: Payer: 59 | Admitting: Internal Medicine

## 2014-08-01 ENCOUNTER — Ambulatory Visit (INDEPENDENT_AMBULATORY_CARE_PROVIDER_SITE_OTHER): Payer: 59 | Admitting: Internal Medicine

## 2014-08-01 ENCOUNTER — Encounter: Payer: Self-pay | Admitting: Internal Medicine

## 2014-08-01 VITALS — BP 154/86 | HR 92 | Temp 98.6°F | Wt 237.0 lb

## 2014-08-01 DIAGNOSIS — E1165 Type 2 diabetes mellitus with hyperglycemia: Secondary | ICD-10-CM

## 2014-08-01 DIAGNOSIS — IMO0002 Reserved for concepts with insufficient information to code with codable children: Secondary | ICD-10-CM

## 2014-08-01 LAB — HEMOGLOBIN A1C: HEMOGLOBIN A1C: 8.9 % — AB (ref 4.6–6.5)

## 2014-08-01 MED ORDER — LINAGLIPTIN 5 MG PO TABS: 5.0000 mg | ORAL_TABLET | Freq: Every day | ORAL | Status: DC

## 2014-08-01 NOTE — Patient Instructions (Signed)
Please continue Metformin XR 1000 mg 2x a day. Continue Invokana 300 mg daily in am. Continue Glipizide XL 10 mg in am. Start Tradjenta 5 mg in am.  Please return in 3 months with your sugar log.   Please stop at the lab.

## 2014-08-01 NOTE — Progress Notes (Signed)
Patient ID: Angela Casey, female   DOB: 24-Jun-1960, 54 y.o.   MRN: [id number]  HPI: Angela Casey is a 54 y.o.-year-old female, initially referred by her PCP, Dr. Wilhemena Durie, for management of DM2, dx 2010, non-insulin-dependent, uncontrolled, without complications. Last visit 3 mo ago.  Last hemoglobin A1c was: Lab Results  Component Value Date   HGBA1C 9.7* 03/08/2014   HGBA1C 9.1* 12/05/2013   HGBA1C 11.1* 06/29/2013  - 09/25/2013: HbA1c 8.3% - 12/27/2012: HbA1c 9.2% - previously: HbA1c 8.4%  Pt is on a regimen of: - Metformin (Glucophage XR) 1000 mg po bid - Invokana 300 mg added 10/2013 - may forget - this improved lately - Glipizide XL 10 mg in am  - started 03/2014 She stopped Victoza 1.8 mg daily >> developed N/D/AP  Pt checks her sugars 2-3x a day >> higher: - am: 182-270 >> 118-169 (180) >> 4 am: 112-150s, then 7 am: 160-225 >> 118-130, 142 >> 135-180, 213 - 2h after b'fast: n/c >> n/c - before lunch: n/c >> 147-169 >> 140 >> n/c >> 139-162 >> 61 x1, 115-125 >> n/c - 2h after lunch: n/c >>  - before dinner: n/c >> 107-168 >> 164-226 >> 106-120 >> 118-198, 210 >> 115-125 >> 125-193, 240 - 2h after dinner: n/c - bedtime: high 200s >> 104-154 >> 175-248 >> 139-156, 210 >> up tp 160, 170, 198 x1 (after eating out) >> 250 - nighttime: n/c No lows. Lowest sugar was 91since last visit; she has hypoglycemia awareness at 80.  Highest sugar was 400 x1 >> 190 >> 200s >> 198 >> 250  She was taking nutrition classes before, [facility]. She was going to the gym 2x a week, not now b/c knee pain.  - no CKD, last BUN/creatinine:  Lab Results  Component Value Date   BUN 16 12/05/2013   Lab Results  Component Value Date   CREATININE 0.8 12/05/2013  On Losartan. - no Lipid panel: Lab Results  Component Value Date   CHOL 110 03/08/2014   HDL 28.40* 03/08/2014   LDLCALC 48 03/08/2014   TRIG 168.0* 03/08/2014   CHOLHDL 4 03/08/2014  She is on Simvastatin.  -  last eye exam was in 07/25/2014. No DR.  - no numbness and tingling in her feet. Foot exam performed 07/2013.  I reviewed pt's medications, allergies, PMH, social hx, family hx, and changes were documented in the history of present illness. Otherwise, unchanged from my initial visit note.  ROS: Constitutional: no weight gain/loss, no fatigue, no subjective hyperthermia/hypothermia, no increased urination Eyes: no blurry vision, no xerophthalmia ENT: no sore throat, no nodules palpated in throat, no dysphagia/odynophagia, no hoarseness Cardiovascular: no CP/SOB/palpitations/leg swelling Respiratory: no cough/SOB Gastrointestinal: no N/no V/no D/no C Musculoskeletal: no muscle/+ joint aches (knee pain) Skin: no rashes Neurological: no tremors/numbness/tingling/dizziness  PE: BP 154/86 mmHg  Pulse 92  Temp(Src) 98.6 F (37 C) (Oral)  Wt 237 lb (107.502 kg) Body mass index is 40.66 kg/(m^2).  Wt Readings from Last 3 Encounters:  08/01/14 237 lb (107.502 kg)  05/11/14 233 lb 12.8 oz (106.051 kg)  04/25/14 229 lb (103.874 kg)   Constitutional: obese, in NAD, full [place] fat pads Eyes: PERRLA, EOMI, no exophthalmos ENT: moist mucous membranes, no thyromegaly, no cervical lymphadenopathy Cardiovascular: RRR, No MRG Respiratory: CTA B Gastrointestinal: abdomen soft, NT, ND, BS+ Musculoskeletal: no deformities, strength intact in all 4 Skin: moist, warm, no rashes  ASSESSMENT: 1. DM2, non-insulin-dependent, uncontrolled, without complications  PLAN:  1. Patient with 5 year h/o DM2, much improved after addition of Glipizide, but now sugars worse after the Holidays and 2/2 not being able to exercise b/c knee pain (per review of log). We will add Tradjenta. If sugars drop >> will need to decrease and maybe we can even stop Glipizide. - We discussed about options for treatment, and I suggested: Patient Instructions  Please continue Metformin XR 1000 mg 2x a day. Continue Invokana 300 mg  daily in am. Continue Glipizide XL 10 mg in am. Start Tradjenta 5 mg in am.  Please return in 3 months with your sugar log.   Please stop at the lab.  - no SEs from Invokana - continue checking sugars at different times of the day - check 2 times a day, rotating checks - advised for yearly eye exams - she is up to date - check HbA1c today - Return to clinic in 3 mo with sugar log     Office Visit on 08/01/2014  Component Date Value Ref Range Status  . HM Diabetic Eye Exam 07/25/2014 No Retinopathy  No Retinopathy Final  . Hgb A1c MFr Bld 08/01/2014 8.9* 4.6 - 6.5 % Final   Glycemic Control Guidelines for People with Diabetes:Non Diabetic:  <6%Goal of Therapy: <7%Additional Action Suggested:  >8%    HbA1c is better than before.

## 2014-08-06 ENCOUNTER — Ambulatory Visit (INDEPENDENT_AMBULATORY_CARE_PROVIDER_SITE_OTHER): Payer: 59 | Admitting: Emergency Medicine

## 2014-08-06 VITALS — BP 110/80 | HR 93 | Temp 98.4°F | Resp 18 | Ht 64.0 in | Wt 240.2 lb

## 2014-08-06 DIAGNOSIS — R21 Rash and other nonspecific skin eruption: Secondary | ICD-10-CM

## 2014-08-06 MED ORDER — VALACYCLOVIR HCL 1 G PO TABS: 1000.0000 mg | ORAL_TABLET | Freq: Three times a day (TID) | ORAL | Status: AC

## 2014-08-06 NOTE — Patient Instructions (Signed)
Please go to your eye care professional today or tomorrow for a second opinion.

## 2014-08-06 NOTE — Progress Notes (Signed)
08/06/2014 at 11:58 AM  Creig Hines / DOB: 1960/08/03 / MRN: [id number]  The patient has Type 2 diabetes mellitus, uncontrolled; RAD (reactive airway disease); and BMI 40.0-44.9, adult on her problem list.  SUBJECTIVE  Chief compalaint: Rash; Eye Problem; and Facial Swelling   History of present illness: Ms. Ergle is 54 y.o. well appearing diabetic female presenting for forehead rash and left eye swelling that started approximately 6 days ago.  She states her symptoms are mild and unchanged.  She denies change in vision, eye pain, and pruritis, and denies paraesthesia or facial pain in association with the rash. .She has tried nothing. She denies a history of shingles and has not received the vaccination.  She reports a history of the chickenpox as a child.      She  has a past medical history of Diabetes mellitus without complication; Hypercholesteremia; Allergy; Asthma; and GERD (gastroesophageal reflux disease).    She has a current medication list which includes the following prescription(s): albuterol, albuterol, canagliflozin, glipizide, glucosamine-chondroitin, glucose blood, insulin pen needle, linagliptin, loratadine, losartan, metformin, omeprazole, simvastatin, and valacyclovir, and the following Facility-Administered Medications: albuterol and ipratropium.  Ms. Dumler is allergic to exenatide. She  reports that she has quit smoking. She does not have any smokeless tobacco history on file. She reports that she drinks alcohol. She reports that she does not use illicit drugs. She  reports that she does not engage in sexual activity. The patient  has past surgical history that includes Knee surgery; Carpal tunnel release; and Back surgery.  Her family history includes COPD in her father; Cancer in her mother; Diabetes in her father and paternal grandfather.  Review of Systems  Constitutional: Negative for fever and chills.  HENT: Negative for congestion and sore throat.   Eyes:  Negative for blurred vision, double vision, photophobia, pain, discharge and redness.  Musculoskeletal: Negative for myalgias and neck pain.  Skin: Positive for rash. Negative for itching.  Neurological: Negative for tingling, sensory change, focal weakness and headaches.    OBJECTIVE  Her  height is 5\' 4"  (1.626 m) and weight is 240 lb 3.2 oz (108.954 kg). Her oral temperature is 98.4 F (36.9 C). Her blood pressure is 110/80 and her pulse is 93. Her respiration is 18 and oxygen saturation is 98%.  The patient's body mass index is 41.21 kg/(m^2).  Physical Exam  Constitutional: She is oriented to person, place, and time. Vital signs are normal. She appears well-developed and well-nourished.  HENT:  Head:    Right Ear: Hearing, tympanic membrane, external ear and ear canal normal.  Left Ear: Hearing, tympanic membrane, external ear and ear canal normal.  Nose: Nose normal.  Mouth/Throat: Uvula is midline, oropharynx is clear and moist and mucous membranes are normal.  Eyes: EOM are normal. Pupils are equal, round, and reactive to light. Lids are everted and swept, no foreign bodies found.    Cardiovascular: Normal rate.   Respiratory: Effort normal.  Neurological: She is alert and oriented to person, place, and time.  Skin: Skin is warm and dry. Rash noted.  Psychiatric: She has a normal mood and affect.    No results found for this or any previous visit (from the past 24 hour(s)).   Visual Acuity Screening   Right eye Left eye Both eyes  Without correction:     With correction: 20/50 20/20 20/20     ASSESSMENT & PLAN  Sherrin was seen today for rash, eye problem and facial swelling.  Diagnoses and all orders for this visit:  Rash and nonspecific skin eruption: Question of zoster vs. bacterial etiology. Rash cultured for bacterial etiology. Her rash appears to be herpetic on inspection, however she denies pain at the lesions as well as facial pain and paraesthesia.  Fortunately her eye appears to be uninvolved today.  Will treat for empirically for Zoster given the low side effect profile vs the risk of herpetic ophthalmicus. Patient to take a 7 days course. Wound culture sent.  She was advised to call her normal eye care provider to be seen today or tomorrow.     Orders: -     valACYclovir (VALTREX) 1000 MG tablet; Take 1 tablet (1,000 mg total) by mouth 3 (three) times daily. -     Wound cultures pending.     The patient was advised to call or come back to clinic if she does not see an improvement in symptoms, or worsens with the above plan.   Philis Fendt, MHS, PA-C [facility] 08/06/2014 11:58 AM

## 2014-08-09 LAB — WOUND CULTURE
Gram Stain: NONE SEEN
Gram Stain: NONE SEEN
Organism ID, Bacteria: NO GROWTH

## 2014-08-27 ENCOUNTER — Ambulatory Visit: Payer: 59

## 2014-09-05 ENCOUNTER — Telehealth: Payer: Self-pay | Admitting: *Deleted

## 2014-09-05 ENCOUNTER — Telehealth: Payer: 59 | Admitting: Family

## 2014-09-05 DIAGNOSIS — J0120 Acute ethmoidal sinusitis, unspecified: Secondary | ICD-10-CM

## 2014-09-05 MED ORDER — SITAGLIPTIN PHOSPHATE 100 MG PO TABS: 100.0000 mg | ORAL_TABLET | Freq: Every day | ORAL | Status: DC

## 2014-09-05 MED ORDER — AMOXICILLIN-POT CLAVULANATE 875-125 MG PO TABS: 1.0000 | ORAL_TABLET | Freq: Two times a day (BID) | ORAL | Status: DC

## 2014-09-05 NOTE — Progress Notes (Signed)

## 2014-09-05 NOTE — Telephone Encounter (Signed)
OK - 100 mg daily of Januvia

## 2014-09-05 NOTE — Telephone Encounter (Signed)
Richardson Landry, pharmacist at [facility] called stating the the insurance will probably approve Januvia for pt. We have been unable to get Tradjenta approved yet. Please advise if ok to change pt to Januvia.

## 2014-09-05 NOTE — Addendum Note (Signed)
Addended by: Rockie Neighbours B on: 09/05/2014 11:31 AM   Modules accepted: Orders

## 2014-09-05 NOTE — Telephone Encounter (Signed)
Done

## 2014-10-30 ENCOUNTER — Ambulatory Visit: Payer: 59 | Admitting: Internal Medicine

## 2014-11-26 LAB — HM COLONOSCOPY

## 2014-12-04 ENCOUNTER — Ambulatory Visit: Payer: Self-pay

## 2015-01-02 ENCOUNTER — Other Ambulatory Visit: Payer: Self-pay | Admitting: Internal Medicine

## 2015-03-26 ENCOUNTER — Other Ambulatory Visit: Payer: Self-pay

## 2015-03-26 DIAGNOSIS — Z1231 Encounter for screening mammogram for malignant neoplasm of breast: Secondary | ICD-10-CM

## 2015-04-23 ENCOUNTER — Other Ambulatory Visit: Payer: Self-pay

## 2015-04-23 VITALS — BP 120/78 | HR 98 | Resp 16 | Ht 63.0 in | Wt 240.2 lb

## 2015-04-23 DIAGNOSIS — E119 Type 2 diabetes mellitus without complications: Secondary | ICD-10-CM

## 2015-04-23 NOTE — Patient Outreach (Signed)
[facility]) Care Management   04/23/2015  Angela Casey 1961/04/03 [id number]  Angela Casey is an 54 y.o. female.  Member seen for follow up office visit for Link to Wellness program for self management of Type 2 diabetes  Subjective: Member states that she had a very busy and stressful summer at work so she has not been seen by endocrinologist or RNCM since the winter.  States that she is now trying to make her health a priority.  States that her blood sugars are slightly improved but she is still getting higher readings in the morning.  States that she did have a low of 75 one day in the afternoon but she realized that she had not eaten lunch.  States that she is having constipation since she started taking the Januvia and she plans to discuss with MD tomorrow.  Objective:   Review of Systems  Gastrointestinal: Positive for constipation.  Musculoskeletal: Positive for joint pain.  Reviewed glucometer 7 day average-161 14 day average-155 30 day average-164  Physical Exam  Today's Vitals   04/23/15 1116 04/23/15 1121  BP: 120/78   Pulse: 98   Resp: 16   Height: 1.6 m (5\' 3" )   Weight: 240 lb 3.2 oz (108.954 kg)   SpO2: 99%   PainSc: 4  4     Current Medications:   Current Outpatient Prescriptions  Medication Sig Dispense Refill  . albuterol (PROVENTIL HFA;VENTOLIN HFA) 108 (90 BASE) MCG/ACT inhaler Inhale 2 puffs into the lungs every 6 (six) hours as needed for wheezing or shortness of breath. 1 Inhaler 5  . albuterol (PROVENTIL) (2.5 MG/3ML) 0.083% nebulizer solution Take 3 mLs (2.5 mg total) by nebulization every 4 (four) hours as needed for wheezing or shortness of breath. 75 mL 0  . Canagliflozin (INVOKANA) 300 MG TABS Take 1 tablet once a day by mouth 90 tablet 3  . glipiZIDE (GLUCOTROL XL) 10 MG 24 hr tablet TAKE 1 TABLET BY MOUTH DAILY WITH BREAKFAST 45 tablet 1  . glucose blood test strip Use 3x a day ("True" strips) 300 each 3  . Loratadine  (CLARITIN PO) Take by mouth.    . losartan (COZAAR) 50 MG tablet Take 1 tablet (50 mg total) by mouth daily. 90 tablet 3  . metFORMIN (GLUCOPHAGE XR) 500 MG 24 hr tablet Take 2 tablets (1,000 mg total) by mouth 2 (two) times daily. 360 tablet 3  . omeprazole (PRILOSEC) 20 MG capsule Take 1 capsule (20 mg total) by mouth daily. 30 capsule 1  . simvastatin (ZOCOR) 20 MG tablet Take 1 tablet (20 mg total) by mouth daily. 90 tablet 3  . sitaGLIPtin (JANUVIA) 100 MG tablet Take 1 tablet (100 mg total) by mouth daily. 30 tablet 3  . amoxicillin-clavulanate (AUGMENTIN) 875-125 MG per tablet Take 1 tablet by mouth every 12 (twelve) hours. (Patient not taking: Reported on 04/23/2015) 14 tablet 0  . glucosamine-chondroitin 500-400 MG tablet Take 1 tablet by mouth daily.    . Insulin Pen Needle 32G X 6 MM MISC Use once a day 50 each 3  . linagliptin (TRADJENTA) 5 MG TABS tablet Take 1 tablet (5 mg total) by mouth daily. (Patient not taking: Reported on 04/23/2015) 30 tablet 3   Current Facility-Administered Medications  Medication Dose Route Frequency Provider Last Rate Last Dose  . albuterol (PROVENTIL) (2.5 MG/3ML) 0.083% nebulizer solution 2.5 mg  2.5 mg Nebulization Once Wendie Agreste, MD      . ipratropium (ATROVENT)  nebulizer solution 0.5 mg  0.5 mg Nebulization Once Wendie Agreste, MD        Functional Status:   In your present state of health, do you have any difficulty performing the following activities: 04/23/2015  Hearing? N  Vision? N  Difficulty concentrating or making decisions? N  Walking or climbing stairs? N  Dressing or bathing? N  Doing errands, shopping? N    Fall/Depression Screening:    PHQ 2/9 Scores 04/23/2015  PHQ - 2 Score 0    Assessment:   Member seen for follow up office visit for Link to Wellness program for self management of Type 2 diabetes.  Member has not seen endocrinologist since 08/01/14 and has appointment scheduled 04/24/15.  She is not meeting  diabetes self management goal of 7 or below with last hemoglobin A1C 8.9 08/01/14.  Member reports having constipation since starting Januvia.  CBGs are slightly improved but continues to have higher readings in the AM.  Reports she is trying to adhere to low CHO diet but struggles with weight.  Reports walking 2-3 times a week for 30 minutes.  Plan:  Plan to  continue to check blood sugar 1-2 times a day with goals of 80-130 before eating and less than 180 after eating 1/1/2 to 2 hours Plan to eat 30-45 gm (2-3 servings) carbohydrates a meal and 15 gm for snacks.   Plan to walk 3 times a week 20-30 minutes.   Goal of 150 minutes a week.  Plan to move every 90 minutes. Plan to have snack at work and bedtime with protein and carbohydrate Plan to complete EMMI by 06/08/14 Plan to keep appointment with Dr. Cruzita Lederer 04/24/15 Plan to return to [facility] to [facility]  on 06/24/14 at [place] Problem One        Most Recent Value   Care Plan Problem One  Elevated blood glucose related to dx of Type 2 DM   Role Documenting the Problem One  Care Management Coordinator   Care Plan for Problem One  Active   [facility] Long Term Goal (31-90 days)  Member will decrease hemoglobin A1C to 7 or below in the next 90 days   [facility] Long Term Goal Start Date  04/23/15   Interventions for Problem One Long Term Goal  Reviewed CHO counting and portion control, Instructed to be sure to eat when she takes her glipizide to help avoid hypoglycemia, Instructed to eat at regular times and to not skip meals, Reinforced to eat a snack with protein and CHO mid morning and at bedtime, Reinforced to try to get regualr exercise and how it effects glycemic control, Instructed to discuss with endocrinologist at [place] appointment to see if the Januvia could be causing her constipation, Instructed on ways to help relieve her constipation and  given EMMI handout on constipation self-care    Peter Garter RN, [facility] Care Management  Coordinator-Link to [place] Management [phone]

## 2015-04-23 NOTE — Patient Instructions (Signed)
1. Plan to  continue to check blood sugar 1-2 times a day with goals of 80-130 before eating and less than 180 after eating 1/1/2 to 2 hours 2. Plan to eat 30-45 gm (2-3 servings) carbohydrates a meal and 15 gm for snacks.   3. Plan to walk 3 times a week 20-30 minutes.   Goal of 150 minutes a week.  Plan to move every 90 minutes. 4. Plan to have snack at work and bedtime with protein and carbohydrate 5. Plan to complete EMMI by 06/08/14 6. Plan to keep appointment with Dr. Cruzita Lederer 04/24/15 7. Plan to return to Link to Wellness  on 06/24/14 at 11AM

## 2015-04-24 ENCOUNTER — Ambulatory Visit (INDEPENDENT_AMBULATORY_CARE_PROVIDER_SITE_OTHER): Payer: 59 | Admitting: Internal Medicine

## 2015-04-24 ENCOUNTER — Encounter: Payer: Self-pay | Admitting: Internal Medicine

## 2015-04-24 ENCOUNTER — Other Ambulatory Visit: Payer: Self-pay | Admitting: *Deleted

## 2015-04-24 ENCOUNTER — Other Ambulatory Visit (INDEPENDENT_AMBULATORY_CARE_PROVIDER_SITE_OTHER): Payer: 59 | Admitting: *Deleted

## 2015-04-24 VITALS — BP 122/66 | HR 96 | Temp 98.1°F | Resp 12 | Wt 239.6 lb

## 2015-04-24 DIAGNOSIS — E1165 Type 2 diabetes mellitus with hyperglycemia: Secondary | ICD-10-CM

## 2015-04-24 DIAGNOSIS — IMO0001 Reserved for inherently not codable concepts without codable children: Secondary | ICD-10-CM

## 2015-04-24 LAB — COMPREHENSIVE METABOLIC PANEL
ALBUMIN: 4.1 g/dL (ref 3.5–5.2)
ALK PHOS: 84 U/L (ref 39–117)
ALT: 27 U/L (ref 0–35)
AST: 18 U/L (ref 0–37)
BILIRUBIN TOTAL: 0.4 mg/dL (ref 0.2–1.2)
BUN: 18 mg/dL (ref 6–23)
CALCIUM: 9.1 mg/dL (ref 8.4–10.5)
CO2: 26 mEq/L (ref 19–32)
Chloride: 102 mEq/L (ref 96–112)
Creatinine, Ser: 0.72 mg/dL (ref 0.40–1.20)
GFR: 90.05 mL/min (ref >60.00)
GLUCOSE: 175 mg/dL — AB (ref 70–99)
POTASSIUM: 4.1 meq/L (ref 3.5–5.1)
Sodium: 137 mEq/L (ref 135–145)
Total Protein: 7.3 g/dL (ref 6.0–8.3)

## 2015-04-24 LAB — LIPID PANEL
CHOLESTEROL: 115 mg/dL (ref 0–200)
HDL: 31.7 mg/dL — AB (ref >39.00)
LDL Cholesterol: 55 mg/dL (ref 0–99)
NonHDL: 83.78
TRIGLYCERIDES: 146 mg/dL (ref 0.0–149.0)
Total CHOL/HDL Ratio: 4
VLDL: 29.2 mg/dL (ref 0.0–40.0)

## 2015-04-24 LAB — POCT GLYCOSYLATED HEMOGLOBIN (HGB A1C): Hemoglobin A1C: 8.5

## 2015-04-24 MED ORDER — GLIPIZIDE 5 MG PO TABS: 5.0000 mg | ORAL_TABLET | Freq: Two times a day (BID) | ORAL | Status: DC

## 2015-04-24 MED ORDER — LOSARTAN POTASSIUM 50 MG PO TABS: 50.0000 mg | ORAL_TABLET | Freq: Every day | ORAL | Status: DC

## 2015-04-24 MED ORDER — CANAGLIFLOZIN 300 MG PO TABS: ORAL_TABLET | ORAL | Status: DC

## 2015-04-24 MED ORDER — METFORMIN HCL ER 500 MG PO TB24: 1000.0000 mg | ORAL_TABLET | Freq: Two times a day (BID) | ORAL | Status: DC

## 2015-04-24 MED ORDER — PITAVASTATIN CALCIUM 2 MG PO TABS: ORAL_TABLET | ORAL | Status: DC

## 2015-04-24 NOTE — Progress Notes (Signed)
Patient ID: Angela Casey, female   DOB: [birth year], 54 y.o.   MRN: [id number]  HPI: Angela Casey is a 54 y.o.-year-old female, initially referred by her PCP, Dr. Wilhemena Durie, for management of DM2, dx 2010, non-insulin-dependent, uncontrolled, without complications. Last visit 9 mo ago!  Last hemoglobin A1c was: Lab Results  Component Value Date   HGBA1C 8.9* 08/01/2014   HGBA1C 9.7* 03/08/2014   HGBA1C 9.1* 12/05/2013  - 09/25/2013: HbA1c 8.3% - 12/27/2012: HbA1c 9.2% - previously: HbA1c 8.4%  Pt is on a regimen of: - Metformin (Glucophage XR) 2000 mg at dinnertime - Invokana 300 mg added 10/2013 - may forget - this improved lately - Glipizide XL 10 mg in am  - started 03/2014 - Januvia - started 07/2014 >> constipation  She stopped Victoza 1.8 mg daily >> developed N/D/AP  Pt checks her sugars 2-3x a day: - am: 118-169 (180) >> 4 am: 112-150s, then 7 am: 160-225 >> 118-130, 142 >> 135-180, 213 >> 134-209 - 2h after b'fast: n/c >> n/c >> 146-191 - before lunch: n/c >> 147-169 >> 140 >> n/c >> 139-162 >> 61 x1, 115-125 >> n/c >> 75, 124-139, 206 - 2h after lunch: n/c >> 146 - before dinner: n/c >> 107-168 >> 164-226 >> 106-120 >> 118-198, 210 >> 115-125 >> 125-193, 240 >> 154 - 2h after dinner: n/c - bedtime: 104-154 >> 175-248 >> 139-156, 210 >> up tp 160, 170, 198 x1 (after eating out) >> 250 >> n/c - nighttime: n/c No lows. Lowest sugar was 91since last visit; she has hypoglycemia awareness at 80.  Highest sugar was 400 x1 >> 190 >> 200s >> 198 >> 250 >> 206  She was taking nutrition classes before, [facility] to [facility].  - no CKD, last BUN/creatinine:  Lab Results  Component Value Date   BUN 16 12/05/2013   Lab Results  Component Value Date   CREATININE 0.8 12/05/2013  On Losartan. - no Lipid panel: Lab Results  Component Value Date   CHOL 110 03/08/2014   HDL 28.40* 03/08/2014   LDLCALC 48 03/08/2014   TRIG 168.0* 03/08/2014   CHOLHDL 4 03/08/2014  She is  on Simvastatin.  - last eye exam was in 07/25/2014. No DR.  - no numbness and tingling in her feet. Foot exam performed 07/2013.  I reviewed pt's medications, allergies, PMH, social hx, family hx, and changes were documented in the history of present illness. Otherwise, unchanged from my initial visit note.  ROS: Constitutional: no weight gain/loss, no fatigue, no subjective hyperthermia/hypothermia, no increased urination Eyes: no blurry vision, no xerophthalmia ENT: no sore throat, no nodules palpated in throat, no dysphagia/odynophagia, no hoarseness Cardiovascular: no CP/SOB/palpitations/leg swelling Respiratory: no cough/SOB Gastrointestinal: no N/no V/no D/+ C Musculoskeletal: no muscle/joint aches Skin: no rashes Neurological: no tremors/numbness/tingling/dizziness  PE: BP 122/66 mmHg  Pulse 96  Temp(Src) 98.1 F (36.7 C) (Oral)  Resp 12  Wt 239 lb 9.6 oz (108.682 kg)  SpO2 97% Body mass index is 42.45 kg/(m^2).  Wt Readings from Last 3 Encounters:  04/24/15 239 lb 9.6 oz (108.682 kg)  04/23/15 240 lb 3.2 oz (108.954 kg)  08/06/14 240 lb 3.2 oz (108.954 kg)   Constitutional: obese, in NAD, full [place] fat pads Eyes: PERRLA, EOMI, no exophthalmos ENT: moist mucous membranes, no thyromegaly, no cervical lymphadenopathy Cardiovascular: RRR, No MRG Respiratory: CTA B Gastrointestinal: abdomen soft, NT, ND, BS+ Musculoskeletal: no deformities, strength intact in all 4 Skin: moist, warm, no rashes  ASSESSMENT: 1. DM2, non-insulin-dependent, uncontrolled, without complications  PLAN:  1. Patient with 6 year h/o DM2, suboptimally controlled, returning after a long absence. - I suggested: Patient Instructions  Please continue: Metformin XR 2000 mg at dinnertime. Invokana 300 mg daily in am. Januvia 100 mg in am.  Please start Magnesium 500 mg daily at night for constipation.  Cut the Glipizide ER 10 mg in half and take 5 mg before b'fast and 5 mg before dinner.  Please  return in 1.5 months with your sugar log.   Please stop at the lab.  - no SEs from Invokana - continue checking sugars at different times of the day - check 2 times a day, rotating checks - advised for yearly eye exams - she is up to date - will check Lipids and CMP - will switch from Simvastatin to Pitavastatin as this helps improving CBGs - advised her to add magnesium to help with constipation  - refilled the rest of her DM meds - check HbA1c today >> 8.5% (better!) - Return to clinic in 3 mo with sugar log    Orders Only on 04/24/2015  Component Date Value Ref Range Status  . Hemoglobin A1C 04/24/2015 8.5   Final  Office Visit on 04/24/2015  Component Date Value Ref Range Status  . Sodium 04/24/2015 137  135 - 145 mEq/L Final  . Potassium 04/24/2015 4.1  3.5 - 5.1 mEq/L Final  . Chloride 04/24/2015 102  96 - 112 mEq/L Final  . CO2 04/24/2015 26  19 - 32 mEq/L Final  . Glucose, Bld 04/24/2015 175* 70 - 99 mg/dL Final  . BUN 04/24/2015 18  6 - 23 mg/dL Final  . Creatinine, Ser 04/24/2015 0.72  0.40 - 1.20 mg/dL Final  . Total Bilirubin 04/24/2015 0.4  0.2 - 1.2 mg/dL Final  . Alkaline Phosphatase 04/24/2015 84  39 - 117 U/L Final  . AST 04/24/2015 18  0 - 37 U/L Final  . ALT 04/24/2015 27  0 - 35 U/L Final  . Total Protein 04/24/2015 7.3  6.0 - 8.3 g/dL Final  . Albumin 04/24/2015 4.1  3.5 - 5.2 g/dL Final  . Calcium 04/24/2015 9.1  8.4 - 10.5 mg/dL Final  . GFR 04/24/2015 90.05  >60.00 mL/min Final  . Cholesterol 04/24/2015 115  0 - 200 mg/dL Final   ATP III Classification       Desirable:  < 200 mg/dL               Borderline High:  200 - 239 mg/dL          High:  > = 240 mg/dL  . Triglycerides 04/24/2015 146.0  0.0 - 149.0 mg/dL Final   Normal:  <150 mg/dLBorderline High:  150 - 199 mg/dL  . HDL 04/24/2015 31.70* >39.00 mg/dL Final  . VLDL 04/24/2015 29.2  0.0 - 40.0 mg/dL Final  . LDL Cholesterol 04/24/2015 55  0 - 99 mg/dL Final  . Total CHOL/HDL Ratio 04/24/2015 4    Final                  Men          Women1/2 Average Risk     3.4          3.3Average Risk          5.0          4.42X Average Risk          9.6  7.13X Average Risk          15.0          11.0                      . NonHDL 04/24/2015 83.78   Final   NOTE:  Non-HDL goal should be 30 mg/dL higher than patient's LDL goal (i.e. LDL goal of < 70 mg/dL, would have non-HDL goal of < 100 mg/dL)

## 2015-04-24 NOTE — Patient Instructions (Signed)
Please continue: Metformin XR 2000 mg at dinnertime. Invokana 300 mg daily in am. Januvia 100 mg in am.  Please start Magnesium 500 mg daily at night for constipation.  Cut the Glipizide ER 10 mg in half and take 5 mg before b'fast and 5 mg before dinner.  Please return in 1.5 months with your sugar log.     Please stop at the lab.

## 2015-04-30 ENCOUNTER — Telehealth: Payer: Self-pay | Admitting: Internal Medicine

## 2015-04-30 MED ORDER — GLUCOSE BLOOD VI STRP: ORAL_STRIP | Status: DC

## 2015-04-30 NOTE — Telephone Encounter (Signed)
Patient need refill of test strips  For meter True test, and if you no longer carry those kinds of meters, what ever you decide to give her is fine.

## 2015-05-27 ENCOUNTER — Other Ambulatory Visit (HOSPITAL_COMMUNITY): Payer: Self-pay | Admitting: Orthopedic Surgery

## 2015-05-27 ENCOUNTER — Ambulatory Visit
Admission: RE | Admit: 2015-05-27 | Discharge: 2015-05-27 | Disposition: A | Discharge status: Home / Self Care | Payer: 59 | Source: Ambulatory Visit

## 2015-05-27 DIAGNOSIS — Z1231 Encounter for screening mammogram for malignant neoplasm of breast: Secondary | ICD-10-CM

## 2015-05-27 DIAGNOSIS — M25562 Pain in left knee: Secondary | ICD-10-CM

## 2015-06-11 ENCOUNTER — Ambulatory Visit (HOSPITAL_COMMUNITY)
Admission: RE | Admit: 2015-06-11 | Discharge: 2015-06-11 | Disposition: A | Discharge status: Home / Self Care | Payer: 59 | Source: Ambulatory Visit | Attending: Orthopedic Surgery | Admitting: Orthopedic Surgery

## 2015-06-11 DIAGNOSIS — M1712 Unilateral primary osteoarthritis, left knee: Secondary | ICD-10-CM | POA: Diagnosis not present

## 2015-06-11 DIAGNOSIS — M25562 Pain in left knee: Secondary | ICD-10-CM | POA: Insufficient documentation

## 2015-06-11 DIAGNOSIS — S83207A Unspecified tear of unspecified meniscus, current injury, left knee, initial encounter: Secondary | ICD-10-CM | POA: Diagnosis not present

## 2015-06-11 DIAGNOSIS — M7122 Synovial cyst of popliteal space [Baker], left knee: Secondary | ICD-10-CM | POA: Insufficient documentation

## 2015-06-11 DIAGNOSIS — X58XXXA Exposure to other specified factors, initial encounter: Secondary | ICD-10-CM | POA: Diagnosis not present

## 2015-06-13 DIAGNOSIS — M25562 Pain in left knee: Secondary | ICD-10-CM | POA: Diagnosis not present

## 2015-06-14 ENCOUNTER — Other Ambulatory Visit (HOSPITAL_COMMUNITY): Payer: Self-pay | Admitting: Orthopedic Surgery

## 2015-06-21 ENCOUNTER — Ambulatory Visit: Payer: 59 | Admitting: Internal Medicine

## 2015-06-24 ENCOUNTER — Encounter (HOSPITAL_BASED_OUTPATIENT_CLINIC_OR_DEPARTMENT_OTHER): Payer: Self-pay | Admitting: *Deleted

## 2015-06-25 ENCOUNTER — Ambulatory Visit: Payer: Self-pay

## 2015-06-26 ENCOUNTER — Encounter (HOSPITAL_BASED_OUTPATIENT_CLINIC_OR_DEPARTMENT_OTHER)
Admission: RE | Admit: 2015-06-26 | Discharge: 2015-06-26 | Disposition: A | Discharge status: Home / Self Care | Payer: 59 | Source: Ambulatory Visit | Attending: Orthopedic Surgery | Admitting: Orthopedic Surgery

## 2015-06-26 DIAGNOSIS — S83242A Other tear of medial meniscus, current injury, left knee, initial encounter: Secondary | ICD-10-CM | POA: Diagnosis not present

## 2015-06-26 DIAGNOSIS — Z7984 Long term (current) use of oral hypoglycemic drugs: Secondary | ICD-10-CM | POA: Diagnosis not present

## 2015-06-26 DIAGNOSIS — X58XXXA Exposure to other specified factors, initial encounter: Secondary | ICD-10-CM | POA: Diagnosis not present

## 2015-06-26 DIAGNOSIS — Z79899 Other long term (current) drug therapy: Secondary | ICD-10-CM | POA: Diagnosis not present

## 2015-06-26 DIAGNOSIS — E7800 Pure hypercholesterolemia, unspecified: Secondary | ICD-10-CM | POA: Diagnosis not present

## 2015-06-26 DIAGNOSIS — J45909 Unspecified asthma, uncomplicated: Secondary | ICD-10-CM | POA: Diagnosis not present

## 2015-06-26 DIAGNOSIS — M2242 Chondromalacia patellae, left knee: Secondary | ICD-10-CM | POA: Diagnosis not present

## 2015-06-26 DIAGNOSIS — J449 Chronic obstructive pulmonary disease, unspecified: Secondary | ICD-10-CM | POA: Diagnosis not present

## 2015-06-26 DIAGNOSIS — Z87891 Personal history of nicotine dependence: Secondary | ICD-10-CM | POA: Diagnosis not present

## 2015-06-26 DIAGNOSIS — K219 Gastro-esophageal reflux disease without esophagitis: Secondary | ICD-10-CM | POA: Diagnosis not present

## 2015-06-26 DIAGNOSIS — Z6841 Body Mass Index (BMI) 40.0 and over, adult: Secondary | ICD-10-CM | POA: Diagnosis not present

## 2015-06-26 DIAGNOSIS — M179 Osteoarthritis of knee, unspecified: Secondary | ICD-10-CM | POA: Diagnosis not present

## 2015-06-26 DIAGNOSIS — E119 Type 2 diabetes mellitus without complications: Secondary | ICD-10-CM | POA: Diagnosis not present

## 2015-06-26 LAB — BASIC METABOLIC PANEL
ANION GAP: 10 (ref 5–15)
BUN: 23 mg/dL — ABNORMAL HIGH (ref 6–20)
CHLORIDE: 104 mmol/L (ref 101–111)
CO2: 26 mmol/L (ref 22–32)
Calcium: 9.7 mg/dL (ref 8.9–10.3)
Creatinine, Ser: 0.72 mg/dL (ref 0.44–1.00)
GFR calc Af Amer: >60 mL/min (ref >60)
GLUCOSE: 82 mg/dL (ref 65–99)
POTASSIUM: 4.7 mmol/L (ref 3.5–5.1)
Sodium: 140 mmol/L (ref 135–145)

## 2015-06-26 MED FILL — OMEPRAZOLE DR 40 MG CAPSULE: 40 | 30 days supply | Qty: 30 | Fill #0

## 2015-06-26 MED FILL — OXYCODONE/APAP 5-325: 5-325 | 5 days supply | Qty: 60 | Fill #0

## 2015-06-27 NOTE — Anesthesia Preprocedure Evaluation (Addendum)
Anesthesia Evaluation  Patient identified by MRN, date of birth, ID band Patient awake    Reviewed: Allergy & Precautions, NPO status , Patient's Chart, lab work & pertinent test results  Airway Mallampati: II  TM Distance: >3 FB Neck ROM: Full    Dental  (+) Teeth Intact   Pulmonary asthma , former smoker,    breath sounds clear to auscultation       Cardiovascular negative cardio ROS   Rhythm:Regular Rate:Normal     Neuro/Psych    GI/Hepatic Neg liver ROS, GERD  ,  Endo/Other  diabetesMorbid obesity  Renal/GU negative Renal ROS     Musculoskeletal  (+) Arthritis ,   Abdominal (+) + obese,   Peds  Hematology   Anesthesia Other Findings   Reproductive/Obstetrics                            Anesthesia Physical Anesthesia Plan  ASA: II  Anesthesia Plan: General   Post-op Pain Management:    Induction: Intravenous  Airway Management Planned: LMA  Additional Equipment:   Intra-op Plan:   Post-operative Plan: Extubation in OR  Informed Consent: I have reviewed the patients History and Physical, chart, labs and discussed the procedure including the risks, benefits and alternatives for the proposed anesthesia with the patient or authorized representative who has indicated his/her understanding and acceptance.   Dental advisory given  Plan Discussed with: CRNA and Surgeon  Anesthesia Plan Comments:         Anesthesia Quick Evaluation

## 2015-06-27 NOTE — H&P (Signed)
Angela Casey is an 55 y.o. female.   Chief Complaint: Left knee pain HPI: Angela Casey is a 55 year old female with left knee pain. She struck several month history of left knee pain localizing to the medial aspect. She's failed conservative management including exercises stretching program injection as well as anti-inflammatories and pain medicine. She reports continued pain and some mechanical symptoms in the left knee. Interestingly she has had right knee before meals reconstruction has fairly significant medial compartment arthritis on the right knee but it is her left knee which is significantly more symptomatic no family history of DVT or pulmonary embolism  Past Medical History  Diagnosis Date  . Diabetes mellitus without complication ([place])   . Hypercholesteremia   . Allergy   . GERD (gastroesophageal reflux disease)   . Arthritis     knees  . Asthma     asthmatic bronchitis    Past Surgical History  Procedure Laterality Date  . Knee surgery    . Carpal tunnel release    . Back surgery      L5-S1  . Anterior cruciate ligament repair Right     Family History  Problem Relation Age of Onset  . Cancer Mother     uterine  . Diabetes Father   . COPD Father   . Diabetes Paternal Grandfather    Social History:  reports that she has quit smoking. She does not have any smokeless tobacco history on file. She reports that she drinks alcohol. She reports that she does not use illicit drugs.  Allergies:  Allergies  Allergen Reactions  . Exenatide Nausea Only    Also AP    No prescriptions prior to admission    Results for orders placed or performed during the hospital encounter of 06/28/15 (from the past 48 hour(s))  Basic metabolic panel     Status: Abnormal   Collection Time: 06/26/15  2:55 PM  Result Value Ref Range   Sodium 140 135 - 145 mmol/L   Potassium 4.7 3.5 - 5.1 mmol/L   Chloride 104 101 - 111 mmol/L   CO2 26 22 - 32 mmol/L   Glucose, Bld 82 65 - 99 mg/dL   BUN 23 (H) 6 - 20 mg/dL   Creatinine, Ser 0.72 0.44 - 1.00 mg/dL   Calcium 9.7 8.9 - 10.3 mg/dL   GFR calc non Af Amer >60 >60 mL/min   GFR calc Af Amer >60 >60 mL/min    Comment: (NOTE) The eGFR has been calculated using the CKD EPI equation. This calculation has not been validated in all clinical situations. eGFR's persistently <60 mL/min signify possible Chronic Kidney Disease.    Anion gap 10 5 - 15   No results found.  Review of Systems  Constitutional: Negative.   HENT: Negative.   Eyes: Negative.   Cardiovascular: Negative.   Gastrointestinal: Negative.   Genitourinary: Negative.   Musculoskeletal: Positive for joint pain.  Skin: Negative.   Neurological: Negative.   Endo/Heme/Allergies: Negative.   Psychiatric/Behavioral: Negative.     Height 5' 3.25" (1.607 m), weight 106.595 kg (235 lb). Physical Exam  Constitutional: She appears well-developed.  HENT:  Head: Normocephalic.  Eyes: Pupils are equal, round, and reactive to light.  Neck: Normal range of motion.  Cardiovascular: Normal rate.   Respiratory: Effort normal.  Neurological: She is alert.  Skin: Skin is warm.  Psychiatric: She has a normal mood and affect.   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 No evidence of stress reaction or edema in the medial tibial plateau were medial femoral condyle lateral side looks intact plantar arthroscopy and debridement of the meniscus leaving as much the normal rim as possible risk and benefits discussed with the patient including but limited to infection in the primary risk of the incomplete pain relief due to the  early onset of degenerative changes. Nonetheless patient is  Fairly incapacitated by her knee symptoms plan for arthroscopy hopefully as a  stand-alone procedure which can buy her some  more time until possible further reconstructive surgery  Angela Casey 06/27/2015, 9:46 PM

## 2015-06-28 ENCOUNTER — Encounter (HOSPITAL_BASED_OUTPATIENT_CLINIC_OR_DEPARTMENT_OTHER): Payer: Self-pay | Admitting: *Deleted

## 2015-06-28 ENCOUNTER — Ambulatory Visit (HOSPITAL_BASED_OUTPATIENT_CLINIC_OR_DEPARTMENT_OTHER): Payer: 59 | Admitting: Anesthesiology

## 2015-06-28 ENCOUNTER — Ambulatory Visit (HOSPITAL_BASED_OUTPATIENT_CLINIC_OR_DEPARTMENT_OTHER)
Admission: RE | Admit: 2015-06-28 | Discharge: 2015-06-28 | Disposition: A | Discharge status: Home / Self Care | Payer: 59 | Source: Ambulatory Visit | Attending: Orthopedic Surgery | Admitting: Orthopedic Surgery

## 2015-06-28 ENCOUNTER — Encounter (HOSPITAL_BASED_OUTPATIENT_CLINIC_OR_DEPARTMENT_OTHER)
Admission: RE | Disposition: A | Discharge status: Home / Self Care | Payer: Self-pay | Source: Ambulatory Visit | Attending: Orthopedic Surgery

## 2015-06-28 DIAGNOSIS — J45909 Unspecified asthma, uncomplicated: Secondary | ICD-10-CM | POA: Insufficient documentation

## 2015-06-28 DIAGNOSIS — Z87891 Personal history of nicotine dependence: Secondary | ICD-10-CM | POA: Insufficient documentation

## 2015-06-28 DIAGNOSIS — S83242A Other tear of medial meniscus, current injury, left knee, initial encounter: Secondary | ICD-10-CM | POA: Insufficient documentation

## 2015-06-28 DIAGNOSIS — J449 Chronic obstructive pulmonary disease, unspecified: Secondary | ICD-10-CM | POA: Insufficient documentation

## 2015-06-28 DIAGNOSIS — M2242 Chondromalacia patellae, left knee: Secondary | ICD-10-CM | POA: Diagnosis not present

## 2015-06-28 DIAGNOSIS — E119 Type 2 diabetes mellitus without complications: Secondary | ICD-10-CM | POA: Insufficient documentation

## 2015-06-28 DIAGNOSIS — S83242D Other tear of medial meniscus, current injury, left knee, subsequent encounter: Secondary | ICD-10-CM | POA: Diagnosis not present

## 2015-06-28 DIAGNOSIS — K219 Gastro-esophageal reflux disease without esophagitis: Secondary | ICD-10-CM | POA: Insufficient documentation

## 2015-06-28 DIAGNOSIS — M179 Osteoarthritis of knee, unspecified: Secondary | ICD-10-CM | POA: Diagnosis not present

## 2015-06-28 DIAGNOSIS — E7800 Pure hypercholesterolemia, unspecified: Secondary | ICD-10-CM | POA: Diagnosis not present

## 2015-06-28 DIAGNOSIS — Z7984 Long term (current) use of oral hypoglycemic drugs: Secondary | ICD-10-CM | POA: Insufficient documentation

## 2015-06-28 DIAGNOSIS — Z79899 Other long term (current) drug therapy: Secondary | ICD-10-CM | POA: Insufficient documentation

## 2015-06-28 DIAGNOSIS — Z6841 Body Mass Index (BMI) 40.0 and over, adult: Secondary | ICD-10-CM | POA: Insufficient documentation

## 2015-06-28 DIAGNOSIS — X58XXXA Exposure to other specified factors, initial encounter: Secondary | ICD-10-CM | POA: Insufficient documentation

## 2015-06-28 HISTORY — PX: KNEE ARTHROSCOPY: SHX127

## 2015-06-28 HISTORY — PX: KNEE ARTHROSCOPY WITH MEDIAL MENISECTOMY: SHX5651

## 2015-06-28 HISTORY — DX: Unspecified osteoarthritis, unspecified site: M19.90

## 2015-06-28 LAB — GLUCOSE, CAPILLARY
GLUCOSE-CAPILLARY: 114 mg/dL — AB (ref 65–99)
Glucose-Capillary: 106 mg/dL — ABNORMAL HIGH (ref 65–99)

## 2015-06-28 SURGERY — ARTHROSCOPY, KNEE
Anesthesia: General | Site: Knee | Laterality: Left

## 2015-06-28 MED ORDER — CHLORHEXIDINE GLUCONATE 4 % EX LIQD: 60.0000 mL | Freq: Once | CUTANEOUS | Status: DC

## 2015-06-28 MED ORDER — ONDANSETRON HCL 4 MG/2ML IJ SOLN
INTRAMUSCULAR | Status: DC | PRN
  Administered 2015-06-28: 4 mg via INTRAVENOUS

## 2015-06-28 MED ORDER — MORPHINE SULFATE (PF) 4 MG/ML IV SOLN
INTRAVENOUS | Status: AC
  Filled 2015-06-28: qty 2

## 2015-06-28 MED ORDER — LIDOCAINE HCL 1 % IJ SOLN
INTRAMUSCULAR | Status: DC | PRN
  Administered 2015-06-28: 10 mL

## 2015-06-28 MED ORDER — LIDOCAINE HCL (CARDIAC) 20 MG/ML IV SOLN
INTRAVENOUS | Status: AC
  Filled 2015-06-28: qty 5

## 2015-06-28 MED ORDER — GLYCOPYRROLATE 0.2 MG/ML IJ SOLN: 0.2000 mg | Freq: Once | INTRAMUSCULAR | Status: DC | PRN

## 2015-06-28 MED ORDER — FENTANYL CITRATE (PF) 100 MCG/2ML IJ SOLN
INTRAMUSCULAR | Status: DC | PRN
  Administered 2015-06-28: 100 ug via INTRAVENOUS
  Administered 2015-06-28: 50 ug via INTRAVENOUS

## 2015-06-28 MED ORDER — MIDAZOLAM HCL 2 MG/2ML IJ SOLN
INTRAMUSCULAR | Status: AC
  Filled 2015-06-28: qty 2

## 2015-06-28 MED ORDER — LACTATED RINGERS IV SOLN
INTRAVENOUS | Status: DC
  Administered 2015-06-28 (×2): via INTRAVENOUS

## 2015-06-28 MED ORDER — MIDAZOLAM HCL 2 MG/2ML IJ SOLN: 1.0000 mg | INTRAMUSCULAR | Status: DC | PRN

## 2015-06-28 MED ORDER — SODIUM CHLORIDE 0.9 % IR SOLN
Status: DC | PRN
  Administered 2015-06-28: 2000 mL

## 2015-06-28 MED ORDER — FENTANYL CITRATE (PF) 100 MCG/2ML IJ SOLN
INTRAMUSCULAR | Status: AC
  Filled 2015-06-28: qty 2

## 2015-06-28 MED ORDER — OXYCODONE HCL 5 MG PO TABS
5.0000 mg | ORAL_TABLET | Freq: Once | ORAL | Status: AC | PRN
  Administered 2015-06-28: 5 mg via ORAL

## 2015-06-28 MED ORDER — PROPOFOL 10 MG/ML IV BOLUS
INTRAVENOUS | Status: DC | PRN
  Administered 2015-06-28: 200 mg via INTRAVENOUS

## 2015-06-28 MED ORDER — LIDOCAINE HCL (CARDIAC) 20 MG/ML IV SOLN
INTRAVENOUS | Status: DC | PRN
  Administered 2015-06-28: 50 mg via INTRAVENOUS

## 2015-06-28 MED ORDER — ONDANSETRON HCL 4 MG/2ML IJ SOLN
INTRAMUSCULAR | Status: AC
  Filled 2015-06-28: qty 2

## 2015-06-28 MED ORDER — BUPIVACAINE-EPINEPHRINE 0.25% -1:200000 IJ SOLN
INTRAMUSCULAR | Status: DC | PRN
  Administered 2015-06-28: 10 mL

## 2015-06-28 MED ORDER — SCOPOLAMINE 1 MG/3DAYS TD PT72: 1.0000 | MEDICATED_PATCH | Freq: Once | TRANSDERMAL | Status: DC

## 2015-06-28 MED ORDER — PROPOFOL 10 MG/ML IV BOLUS
INTRAVENOUS | Status: AC
  Filled 2015-06-28: qty 40

## 2015-06-28 MED ORDER — FENTANYL CITRATE (PF) 100 MCG/2ML IJ SOLN: 50.0000 ug | INTRAMUSCULAR | Status: DC | PRN

## 2015-06-28 MED ORDER — DEXAMETHASONE SODIUM PHOSPHATE 10 MG/ML IJ SOLN
INTRAMUSCULAR | Status: AC
  Filled 2015-06-28: qty 1

## 2015-06-28 MED ORDER — CEFAZOLIN SODIUM-DEXTROSE 2-3 GM-% IV SOLR
2.0000 g | INTRAVENOUS | Status: AC
  Administered 2015-06-28: 2 g via INTRAVENOUS

## 2015-06-28 MED ORDER — MORPHINE SULFATE (PF) 4 MG/ML IV SOLN
INTRAVENOUS | Status: DC | PRN
  Administered 2015-06-28: 8 mg via INTRAVENOUS

## 2015-06-28 MED ORDER — KETOROLAC TROMETHAMINE 30 MG/ML IJ SOLN
INTRAMUSCULAR | Status: AC
  Filled 2015-06-28: qty 1

## 2015-06-28 MED ORDER — OXYCODONE HCL 5 MG PO TABS
ORAL_TABLET | ORAL | Status: AC
  Filled 2015-06-28: qty 1

## 2015-06-28 MED ORDER — KETOROLAC TROMETHAMINE 30 MG/ML IJ SOLN
INTRAMUSCULAR | Status: DC | PRN
  Administered 2015-06-28: 30 mg via INTRAVENOUS

## 2015-06-28 MED ORDER — CLONIDINE HCL (ANALGESIA) 100 MCG/ML EP SOLN
EPIDURAL | Status: DC | PRN
  Administered 2015-06-28: 1 mL

## 2015-06-28 MED ORDER — PROMETHAZINE HCL 25 MG/ML IJ SOLN: 6.2500 mg | INTRAMUSCULAR | Status: DC | PRN

## 2015-06-28 MED ORDER — CEFAZOLIN SODIUM 1 G IJ SOLR
INTRAMUSCULAR | Status: AC
  Filled 2015-06-28: qty 20

## 2015-06-28 MED ORDER — DEXAMETHASONE SODIUM PHOSPHATE 4 MG/ML IJ SOLN
INTRAMUSCULAR | Status: DC | PRN
  Administered 2015-06-28: 10 mg via INTRAVENOUS

## 2015-06-28 MED ORDER — HYDROMORPHONE HCL 1 MG/ML IJ SOLN: 0.2500 mg | INTRAMUSCULAR | Status: DC | PRN

## 2015-06-28 MED ORDER — MIDAZOLAM HCL 5 MG/5ML IJ SOLN
INTRAMUSCULAR | Status: DC | PRN
  Administered 2015-06-28: 2 mg via INTRAVENOUS

## 2015-06-28 SURGICAL SUPPLY — 46 items
BANDAGE ACE 4X5 VEL STRL LF (GAUZE/BANDAGES/DRESSINGS) IMPLANT
BANDAGE ACE 6X5 VEL STRL LF (GAUZE/BANDAGES/DRESSINGS) IMPLANT
BANDAGE ESMARK 6X9 LF (GAUZE/BANDAGES/DRESSINGS) IMPLANT
BLADE CUDA GRT WHITE 3.5 (BLADE) IMPLANT
BLADE CUTTER GATOR 3.5 (BLADE) ×2 IMPLANT
BLADE GREAT WHITE 4.2 (BLADE) IMPLANT
BLADE SURG 15 STRL LF DISP TIS (BLADE) IMPLANT
BLADE SURG 15 STRL SS (BLADE)
BNDG ESMARK 6X9 LF (GAUZE/BANDAGES/DRESSINGS)
CANISTER SUC SOCK COL 7IN (MISCELLANEOUS) IMPLANT
CUFF TOURNIQUET SINGLE 34IN LL (TOURNIQUET CUFF) ×2 IMPLANT
CUFF TOURNIQUET SINGLE 44IN (TOURNIQUET CUFF) IMPLANT
DRAPE ARTHROSCOPY W/POUCH 114 (DRAPES) ×2 IMPLANT
DRAPE INCISE IOBAN 66X45 STRL (DRAPES) ×2 IMPLANT
DRAPE U-SHAPE 47X51 STRL (DRAPES) ×2 IMPLANT
DRSG TEGADERM 2-3/8X2-3/4 SM (GAUZE/BANDAGES/DRESSINGS) IMPLANT
DURAPREP 26ML APPLICATOR (WOUND CARE) ×2 IMPLANT
ELECT MENISCUS 165MM 90D (ELECTRODE) IMPLANT
ELECT REM PT RETURN 9FT ADLT (ELECTROSURGICAL)
ELECTRODE REM PT RTRN 9FT ADLT (ELECTROSURGICAL) IMPLANT
EVACUATOR SILICONE 100CC (DRAIN) IMPLANT
GAUZE SPONGE 4X4 12PLY STRL (GAUZE/BANDAGES/DRESSINGS) ×2 IMPLANT
GAUZE SPONGE 4X4 16PLY XRAY LF (GAUZE/BANDAGES/DRESSINGS) IMPLANT
GAUZE XEROFORM 1X8 LF (GAUZE/BANDAGES/DRESSINGS) ×2 IMPLANT
GLOVE BIOGEL PI IND STRL 8 (GLOVE) ×1 IMPLANT
GLOVE BIOGEL PI INDICATOR 8 (GLOVE) ×1
GLOVE SURG ORTHO 8.0 STRL STRW (GLOVE) ×2 IMPLANT
GOWN STRL REUS W/ TWL LRG LVL3 (GOWN DISPOSABLE) ×1 IMPLANT
GOWN STRL REUS W/TWL LRG LVL3 (GOWN DISPOSABLE) ×1
HOLDER KNEE FOAM BLUE (MISCELLANEOUS) IMPLANT
KNEE WRAP E Z 3 GEL PACK (MISCELLANEOUS) ×2 IMPLANT
MANIFOLD NEPTUNE II (INSTRUMENTS) ×2 IMPLANT
NDL SAFETY ECLIPSE 18X1.5 (NEEDLE) ×2 IMPLANT
NEEDLE HYPO 18GX1.5 SHARP (NEEDLE) ×2
PACK ARTHROSCOPY DSU (CUSTOM PROCEDURE TRAY) ×2 IMPLANT
PACK BASIN DAY SURGERY FS (CUSTOM PROCEDURE TRAY) ×2 IMPLANT
PADDING CAST COTTON 6X4 STRL (CAST SUPPLIES) ×2 IMPLANT
PENCIL BUTTON HOLSTER BLD 10FT (ELECTRODE) IMPLANT
SET ARTHROSCOPY TUBING (MISCELLANEOUS) ×1
SET ARTHROSCOPY TUBING LN (MISCELLANEOUS) ×1 IMPLANT
SUT ETHILON 3 0 PS 1 (SUTURE) ×2 IMPLANT
SUT VIC AB 3-0 FS2 27 (SUTURE) IMPLANT
SYR 5ML LL (SYRINGE) ×4 IMPLANT
TOWEL OR 17X24 6PK STRL BLUE (TOWEL DISPOSABLE) ×2 IMPLANT
WAND STAR VAC 90 (SURGICAL WAND) IMPLANT
WATER STERILE IRR 1000ML POUR (IV SOLUTION) ×2 IMPLANT

## 2015-06-28 NOTE — Anesthesia Procedure Notes (Addendum)
Procedure Name: LMA Insertion Date/Time: 06/28/2015 7:44 AM Performed by: Toula Moos L Pre-anesthesia Checklist: Emergency Drugs available, Suction available, Patient being monitored, Timeout performed and Patient identified Patient Re-evaluated:Patient Re-evaluated prior to inductionOxygen Delivery Method: Circle System Utilized Preoxygenation: Pre-oxygenation with 100% oxygen Intubation Type: IV induction Ventilation: Mask ventilation without difficulty LMA: LMA inserted LMA Size: 4.0 Number of attempts: 1 Airway Equipment and Method: Bite block Placement Confirmation: positive ETCO2 Tube secured with: Tape Dental Injury: Teeth and Oropharynx as per pre-operative assessment

## 2015-06-28 NOTE — Discharge Instructions (Signed)

## 2015-06-28 NOTE — Brief Op Note (Signed)
06/28/2015  8:36 AM  PATIENT:  Angela Casey  55 y.o. female  PRE-OPERATIVE DIAGNOSIS:  Medial Meniscus Tear Left Knee  POST-OPERATIVE DIAGNOSIS:  Medial Meniscus Tear Left Knee  PROCEDURE:  Procedure(s): Left Knee Arthroscopy and Debridement KNEE ARTHROSCOPY WITH MEDIAL MENISECTOMY  SURGEON:  Surgeon(s): Meredith Pel, MD  ASSISTANT: none  ANESTHESIA:   general  EBL: 5 ml    Total I/O In: 1400 [I.V.:1400] Out: -   BLOOD ADMINISTERED: none  DRAINS: none   LOCAL MEDICATIONS USED:  none  SPECIMEN:  No Specimen  COUNTS:  YES  TOURNIQUET:    DICTATION: .Other Dictation: Dictation Number done   PLAN OF CARE: Discharge to home after PACU  PATIENT DISPOSITION:  PACU - hemodynamically stable

## 2015-06-28 NOTE — Interval H&P Note (Signed)
History and Physical Interval Note:  06/28/2015 7:32 AM  Angela Casey  has presented today for surgery, with the diagnosis of Medial Meniscus Tear Left Knee  The various methods of treatment have been discussed with the patient and family. After consideration of risks, benefits and other options for treatment, the patient has consented to  Procedure(s): Left Knee Arthroscopy and Debridement (Left) as a surgical intervention .  The patient's history has been reviewed, patient examined, no change in status, stable for surgery.  I have reviewed the patient's chart and labs.  Questions were answered to the patient's satisfaction.     Angela Casey

## 2015-06-28 NOTE — Transfer of Care (Signed)
Immediate Anesthesia Transfer of Care Note  Patient: Angela Casey  Procedure(s) Performed: Procedure(s): Left Knee Arthroscopy and Debridement (Left) KNEE ARTHROSCOPY WITH MEDIAL MENISECTOMY (Left)  Patient Location: PACU  Anesthesia Type:General  Level of Consciousness: sedated  Airway & Oxygen Therapy: Patient Spontanous Breathing and Patient connected to face mask oxygen  Post-op Assessment: Report given to RN and Post -op Vital signs reviewed and stable  Post vital signs: Reviewed and stable  Last Vitals:  Filed Vitals:   06/28/15 0630 06/28/15 0836  BP: 123/63   Pulse: 93 84  Temp: 36.6 C   Resp: 18 16    Complications: No apparent anesthesia complications

## 2015-06-28 NOTE — Anesthesia Postprocedure Evaluation (Signed)
Anesthesia Post Note  Patient: Angela Casey  Procedure(s) Performed: Procedure(s) (LRB): Left Knee Arthroscopy and Debridement (Left) KNEE ARTHROSCOPY WITH MEDIAL MENISECTOMY (Left)  Patient location during evaluation: PACU Anesthesia Type: General Level of consciousness: awake and alert Pain management: pain level controlled Vital Signs Assessment: post-procedure vital signs reviewed and stable Respiratory status: spontaneous breathing, nonlabored ventilation, respiratory function stable and patient connected to nasal cannula oxygen Cardiovascular status: blood pressure returned to baseline and stable Postop Assessment: no signs of nausea or vomiting Anesthetic complications: no    Last Vitals:  Filed Vitals:   06/28/15 0940 06/28/15 1008  BP:  122/68  Pulse: 84 85  Temp:  36.5 C  Resp: 24 15    Last Pain:  Filed Vitals:   06/28/15 1011  PainSc: 4                  Zayven Powe,JAMES TERRILL

## 2015-06-28 NOTE — Op Note (Signed)
NAMEDEMIA, RINGE                 ACCOUNT NO.:  [id number]  MEDICAL RECORD NO.:  [id number]  LOCATION:                                 FACILITY:  PHYSICIAN:  Anderson Malta, M.D.    DATE OF BIRTH:  Nov 10, 1960  DATE OF PROCEDURE: DATE OF DISCHARGE:                              OPERATIVE REPORT   PREOPERATIVE DIAGNOSIS:  Left knee medial meniscal tear.  POSTOPERATIVE DIAGNOSIS:  Left knee medial meniscal tear.  PROCEDURE:  Left knee arthroscopy with partial medial meniscectomy.  SURGEON:  Anderson Malta, M.D.  ASSISTANT:  None.  ANESTHESIA:  General.  INDICATIONS:  Angela Casey is a patient with left knee pain, presents for operative management after explanation of risks and benefits.  OPERATION FINDINGS: 1. Examination under anesthesia, range of motion 0-145 degrees of     flexion with stability, varus and valgus stress at 0 and 30     degrees.  ACL and PCL intact.  No posterolateral rotatory     instability is noted. 2. Diagnostic arthroscopy.     a.     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     b.     No loose bodies in the medial and lateral gutter.     c.     Intact lateral compartment, articular cartilage and      meniscus.     d.     Intact ACL and PCL.     e.     About an 80% radial tear of the posterior horn and medial      meniscus with early grade 2 chondromalacia on the medial femoral      condyle, medial tibial plateau involving about 30% of the      weightbearing surface area with one small focal 5 x 5 mm area of      grade 4 chondromalacia.  PROCEDURE IN DETAIL:  The patient was brought to the operating room where general endotracheal anesthesia was induced.  Preoperative antibiotics were administered.  Time-out was called.  Left leg was examined under anesthesia, prescrubbed with alcohol and Betadine, allowed to air dry, prepped with DuraPrep solution.  Anterior, inferior, and lateral  portals were established.  Anterior, inferior and medial ports were established under direct visualization.  Diagnostic arthroscopy was performed.  The patient had medial meniscal tear as well as some chondromalacia in both the patellofemoral and medial compartment.  Lateral compartment was intact.  ACL and PCL were intact. Meniscal tear, which was an 80% tear.  Radial was debrided back to a stable rims with combination of basket punch and shaver.  No other real addressable chondral defects were present, some small loose chondral flaps were shaved on the undersurface of the patella, trochlea and on the medial femoral condyle. Following this, thorough irrigation was performed.  Instruments were removed.  Portals were closed using 3-0 nylon.  Solution of Marcaine, morphine, and clonidine injected into the knee.  The patient tolerated the procedure well without immediate complications.  Bulky wrap applied.     Anderson Malta, M.D.  GSD/MEDQ  D:  06/28/2015  T:  06/28/2015  Job:  [id number]

## 2015-07-01 ENCOUNTER — Encounter (HOSPITAL_BASED_OUTPATIENT_CLINIC_OR_DEPARTMENT_OTHER): Payer: Self-pay | Admitting: Orthopedic Surgery

## 2015-07-23 ENCOUNTER — Ambulatory Visit (INDEPENDENT_AMBULATORY_CARE_PROVIDER_SITE_OTHER): Payer: 59 | Admitting: Internal Medicine

## 2015-07-23 ENCOUNTER — Other Ambulatory Visit (INDEPENDENT_AMBULATORY_CARE_PROVIDER_SITE_OTHER): Payer: 59 | Admitting: *Deleted

## 2015-07-23 ENCOUNTER — Encounter: Payer: Self-pay | Admitting: Internal Medicine

## 2015-07-23 VITALS — BP 118/60 | HR 100 | Temp 98.2°F | Resp 12 | Wt 231.6 lb

## 2015-07-23 DIAGNOSIS — IMO0001 Reserved for inherently not codable concepts without codable children: Secondary | ICD-10-CM

## 2015-07-23 DIAGNOSIS — E1165 Type 2 diabetes mellitus with hyperglycemia: Secondary | ICD-10-CM

## 2015-07-23 LAB — POCT GLYCOSYLATED HEMOGLOBIN (HGB A1C): Hemoglobin A1C: 7.4

## 2015-07-23 MED ORDER — PITAVASTATIN CALCIUM 2 MG PO TABS
ORAL_TABLET | ORAL | Status: DC
Start: 2015-07-23 — End: 2016-08-06

## 2015-07-23 MED ORDER — GLIPIZIDE 5 MG PO TABS: 5.0000 mg | ORAL_TABLET | Freq: Two times a day (BID) | ORAL | Status: DC

## 2015-07-23 MED ORDER — CANAGLIFLOZIN 300 MG PO TABS: ORAL_TABLET | ORAL | Status: DC

## 2015-07-23 MED ORDER — METFORMIN HCL ER 500 MG PO TB24: 1000.0000 mg | ORAL_TABLET | Freq: Two times a day (BID) | ORAL | Status: DC

## 2015-07-23 MED ORDER — LOSARTAN POTASSIUM 50 MG PO TABS: 50.0000 mg | ORAL_TABLET | Freq: Every day | ORAL | Status: DC

## 2015-07-23 MED FILL — LOSARTAN POTASSIUM 50 MG TA: 50 | 90 days supply | Qty: 90 | Fill #0

## 2015-07-23 MED FILL — INVOKANA 300 MG TABLET: 300 | 90 days supply | Qty: 90 | Fill #0

## 2015-07-23 MED FILL — LIVALO 2 MG TABLET: 2 | 30 days supply | Qty: 30 | Fill #0

## 2015-07-23 MED FILL — glipiZIDE 5 MG TABS: 5 | 90 days supply | Qty: 180 | Fill #0

## 2015-07-23 MED FILL — METFORMIN HCL ER 500 MG TAB: 500 | 90 days supply | Qty: 360 | Fill #0

## 2015-07-23 NOTE — Progress Notes (Signed)
Patient ID: Angela Casey, female   DOB: May 29, 1961, 55 y.o.   MRN: [id number]  HPI: Angela Casey is a 55 y.o.-year-old female, initially referred by her PCP, Dr. Wilhemena Durie, for management of DM2, dx 2010, non-insulin-dependent, uncontrolled, without complications. Last visit 3 mo ago.  She lost 8 lbs since last visit.  She was exercising >> torn the cartilage in L knee >> surgery 3 weeks ago.  Last hemoglobin A1c was: Lab Results  Component Value Date   HGBA1C 8.5 04/24/2015   HGBA1C 8.9* 08/01/2014   HGBA1C 9.7* 03/08/2014  - 09/25/2013: HbA1c 8.3% - 12/27/2012: HbA1c 9.2% - previously: HbA1c 8.4%  Pt is on a regimen of: - Metformin (Glucophage XR) 2000 mg at dinnertime - Invokana 300 mg added 10/2013 - may forget - this improved lately - Glipizide 5 mg 2x a day  - started 03/2014 She tried Januvia - started 07/2014 >> constipation (Mg did not help) >> try to stop and restart >> sxs restarted >> had same sxs >> stopped 05/2015 She stopped Victoza 1.8 mg daily >> developed N/D/AP  Pt checks her sugars 2-3x a day: - am: 112-150s, then 7 am: 160-225 >> 118-130, 142 >> 135-180, 213 >> 134-209 >> 81, 110-155, 171 - 2h after b'fast: n/c >> n/c >> 146-191 >> 131 - before lunch: n/c >> 147-169 >> 140 >> n/c >> 139-162 >> 61 x1, 115-125 >> n/c >> 75, 124-139, 206 >> 80, 113-135, 141 - 2h after lunch: n/c >> 146 >> 102, 228 - before dinner: n/c >> 107-168 >> 164-226 >> 106-120 >> 118-198, 210 >> 115-125 >> 125-193, 240 >> 84-200 - 2h after dinner: n/c >> 91-196 - bedtime: 104-154 >> 175-248 >> 139-156, 210 >> up tp 160, 170, 198 x1 (after eating out) >> 250 >> n/c - nighttime: n/c No lows. Lowest sugar was 91 >> 80; she has hypoglycemia awareness at 80.  Highest sugar was 400 x1 >> 190 >> 200s >> 198 >> 250 >> 206 >> 323x1  She was taking nutrition classes before, [facility].  - no CKD, last BUN/creatinine:  Lab Results  Component Value Date   BUN 23* 06/26/2015    Lab Results  Component Value Date   CREATININE 0.72 06/26/2015  On Losartan. - no Lipid panel: Lab Results  Component Value Date   CHOL 115 04/24/2015   HDL 31.70* 04/24/2015   LDLCALC 55 04/24/2015   TRIG 146.0 04/24/2015   CHOLHDL 4 04/24/2015  She is on Simvastatin.  - last eye exam was in 07/25/2014. No DR.  - no numbness and tingling in her feet. Foot exam performed 07/2013.  I reviewed pt's medications, allergies, PMH, social hx, family hx, and changes were documented in the history of present illness. Otherwise, unchanged from my initial visit note.  ROS: Constitutional:+ weight loss, no fatigue, no subjective hyperthermia/hypothermia, no increased urination Eyes: no blurry vision, no xerophthalmia ENT: no sore throat, no nodules palpated in throat, no dysphagia/odynophagia, no hoarseness Cardiovascular: no CP/SOB/palpitations/leg swelling Respiratory: no cough/SOB Gastrointestinal: no N/no V/no D/+ C Musculoskeletal: no muscle/joint aches Skin: no rashes Neurological: no tremors/numbness/tingling/dizziness  PE: BP 118/60 mmHg  Pulse 100  Temp(Src) 98.2 F (36.8 C) (Oral)  Resp 12  Wt 231 lb 9.6 oz (105.053 kg)  SpO2 97% Body mass index is 41.04 kg/(m^2).  Wt Readings from Last 3 Encounters:  07/23/15 231 lb 9.6 oz (105.053 kg)  06/28/15 231 lb 6 oz (104.951 kg)  04/24/15 239 lb  9.6 oz (108.682 kg)   Constitutional: obese, in NAD, full [place] fat pads Eyes: PERRLA, EOMI, no exophthalmos ENT: moist mucous membranes, no thyromegaly, no cervical lymphadenopathy Cardiovascular: RRR, No MRG Respiratory: CTA B Gastrointestinal: abdomen soft, NT, ND, BS+ Musculoskeletal: no deformities, strength intact in all 4 Skin: moist, warm, no rashes  ASSESSMENT: 1. DM2, non-insulin-dependent, uncontrolled, without complications  PLAN:  1. Patient with suboptimally controlled DM2, now with better control despite stopping Januvia (exercise, diet).  - I suggested: Patient  Instructions  Please continue: Metformin XR 2000 mg at dinnertime Invokana 300 mg daily in am Januvia 100 mg in am   Glipizide 5 mg before b'fast and 5 mg before dinner  Please return in 3 months with your sugar log.   - no SEs from Invokana - continue checking sugars at different times of the day - check 2 times a day, rotating checks - advised for yearly eye exams - she is up to date - at last visit I advised her to switch from Simvastatin to Pitavastatin as this helps improving CBGs. She did not start yet as she had Simvastatin at home. - check HbA1c today >> 7.4% (decreased!) - Return to clinic in 3 mo with sugar log

## 2015-07-23 NOTE — Patient Instructions (Signed)
Please continue: Metformin XR 2000 mg at dinnertime Invokana 300 mg daily in am Januvia 100 mg in am   Glipizide 5 mg before b'fast and 5 mg before dinner  Please return in 3 months with your sugar log.

## 2015-07-26 ENCOUNTER — Telehealth: Payer: 59 | Admitting: Nurse Practitioner

## 2015-07-26 DIAGNOSIS — J0100 Acute maxillary sinusitis, unspecified: Secondary | ICD-10-CM | POA: Diagnosis not present

## 2015-07-26 MED ORDER — AZITHROMYCIN 250 MG PO TABS: ORAL_TABLET | ORAL | Status: DC

## 2015-07-26 MED FILL — AZITHROMYCIN 250 MG TABLET: 250 | 5 days supply | Qty: 6 | Fill #0

## 2015-07-26 NOTE — Progress Notes (Signed)

## 2015-08-02 DIAGNOSIS — M79672 Pain in left foot: Secondary | ICD-10-CM | POA: Diagnosis not present

## 2015-08-02 DIAGNOSIS — M25579 Pain in unspecified ankle and joints of unspecified foot: Secondary | ICD-10-CM | POA: Diagnosis not present

## 2015-08-15 ENCOUNTER — Telehealth: Payer: 59 | Admitting: Physician Assistant

## 2015-08-15 DIAGNOSIS — J019 Acute sinusitis, unspecified: Secondary | ICD-10-CM

## 2015-08-15 MED ORDER — AMOXICILLIN-POT CLAVULANATE 875-125 MG PO TABS: 1.0000 | ORAL_TABLET | Freq: Two times a day (BID) | ORAL | Status: DC

## 2015-08-15 MED FILL — AMOX TR-K CLV 875-125 MG TA: 875-125 | 7 days supply | Qty: 14 | Fill #0

## 2015-08-15 MED FILL — OMEPRAZOLE DR 40 MG CAPSULE: 40 | 30 days supply | Qty: 30 | Fill #1

## 2015-08-15 NOTE — Progress Notes (Signed)

## 2015-08-27 ENCOUNTER — Other Ambulatory Visit: Payer: Self-pay

## 2015-08-27 VITALS — BP 104/64 | HR 90 | Resp 16 | Ht 63.0 in | Wt 235.2 lb

## 2015-08-27 DIAGNOSIS — E119 Type 2 diabetes mellitus without complications: Secondary | ICD-10-CM

## 2015-08-27 NOTE — Patient Outreach (Signed)
[facility]) Care Management   08/27/2015  Angela Casey Mar 18, 1961 [id number]  Angela Casey is an 55 y.o. female.   Member seen for follow up office visit for Link to Wellness program for self management of Type 2 diabetes  Subjective: Member states that her hemoglobin A1C had gone down to 7.4 when she saw Dr.Gherhe on 07/23/15.  States she is no longer taking the Januvia as she thinks it caused her constipation to be worse.  States she recovered from her knee surgery well.  States she is riding her bike for exercise 2-3 times a week.  States she is trying to eat healthier and she is avoiding fried foods.  States she has not started taking the Livalo as she was finishing up her Simvastatin and she is waiting to recover from her sinus infections.  Objective:   Review of Systems  All other systems reviewed and are negative. Reviewed glucometer 7 day average-140 14 day average-136 30 day average-143  Physical Exam Today's Vitals   08/27/15 1125  BP: 104/64  Pulse: 90  Resp: 16  Height: 1.6 m (5\' 3" )  Weight: 235 lb 3.2 oz (106.686 kg)  SpO2: 98%  PainSc: 0-No pain   Current Medications:   Current Outpatient Prescriptions  Medication Sig Dispense Refill  . albuterol (PROVENTIL HFA;VENTOLIN HFA) 108 (90 Base) MCG/ACT inhaler Inhale into the lungs every 6 (six) hours as needed for wheezing or shortness of breath.    . canagliflozin (INVOKANA) 300 MG TABS tablet Take 1 tablet once a day by mouth 90 tablet 1  . glipiZIDE (GLUCOTROL) 5 MG tablet Take 1 tablet (5 mg total) by mouth 2 (two) times daily before a meal. 180 tablet 1  . glucose blood test strip Use 3x a day; True Test Strips. 300 each 3  . ibuprofen (ADVIL,MOTRIN) 600 MG tablet Take 600 mg by mouth every 6 (six) hours as needed.    . Loratadine (CLARITIN PO) Take by mouth.    . losartan (COZAAR) 50 MG tablet Take 1 tablet (50 mg total) by mouth daily. 90 tablet 1  . metFORMIN (GLUCOPHAGE XR) 500 MG 24 hr  tablet Take 2 tablets (1,000 mg total) by mouth 2 (two) times daily. 360 tablet 1  . omeprazole (PRILOSEC) 20 MG capsule Take 1 capsule (20 mg total) by mouth daily. 30 capsule 1  . amoxicillin-clavulanate (AUGMENTIN) 875-125 MG tablet Take 1 tablet by mouth 2 (two) times daily. (Patient not taking: Reported on 08/27/2015) 14 tablet 0  . azithromycin (ZITHROMAX Z-PAK) 250 MG tablet As directed (Patient not taking: Reported on 08/27/2015) 1 each 0  . Pitavastatin Calcium 2 MG TABS Please take 1 tablet daily. (Patient not taking: Reported on 08/27/2015) 30 tablet 2   Current Facility-Administered Medications  Medication Dose Route Frequency Provider Last Rate Last Dose  . albuterol (PROVENTIL) (2.5 MG/3ML) 0.083% nebulizer solution 2.5 mg  2.5 mg Nebulization Once Wendie Agreste, MD      . ipratropium (ATROVENT) nebulizer solution 0.5 mg  0.5 mg Nebulization Once Wendie Agreste, MD        Functional Status:   In your present state of health, do you have any difficulty performing the following activities: 08/27/2015 06/28/2015  Hearing? N N  Vision? N N  Difficulty concentrating or making decisions? N N  Walking or climbing stairs? N N  Dressing or bathing? N N  Doing errands, shopping? N -    Fall/Depression Screening:    PHQ  2/9 Scores 08/27/2015 04/23/2015  PHQ - 2 Score 0 0    Assessment:   Member seen for follow up office visit for Link to Wellness program for self management of Type 2 diabetes. She is not meeting diabetes self management goal of 7 or below with last hemoglobin A1C 7.4 which is down form 8.9.  Member had one episode of hypoglycemia with CBG of 58.  CBGs ranging 92-187 fasting and 92-224 post prandial Reports she is trying to adhere to low CHO diet and is avoiding fried foods Reports riding bike 2-3 times a week for 30 minutes.  Plan:   Plan to  continue to check blood sugar 1-2 times a day with goals of 80-130 before eating and less than 180 after eating 1/1/2 to 2  hours Plan to eat 30-45 gm (2-3 servings) carbohydrates a meal and 15 gm for snacks.   Plan to bring lunch 4 days a week. Plan to walk 3 times a week 30 minutes.   Goal of 150 minutes a week.  Plan to move every 90 minutes. Plan to complete EMMI by 10/07/15 Plan to keep appointment with Dr. Cruzita Lederer 10/24/15 Plan to return to [facility] to [facility]  on 11/26/15 at 11:15AM  [facility] CM Care Plan Problem One        Most Recent Value   Care Plan Problem One  Elevated blood glucose related to dx of Type 2 DM   Role Documenting the Problem One  Care Management [place] for Problem One  Active   [facility] Long Term Goal (31-90 days)  Member will decrease hemoglobin A1C to 7 or below in the next 90 days   [facility] Long Term Goal Start Date  08/27/15 [Decreased hemoglobin A1C to 7.4]   Interventions for Problem One Long Term Goal  Reviewed CHO counting and portion control, Reinforced to be sure to eat when she takes her glipizide to help avoid hypoglycemia, Reinforced on s/s of hypoglycemia and actions to take to treat, Given handout on how to treat hypoglycemia, Reinforced to eat at regular times and to not skip meals, Reinforced to eat a snack with protein and CHO mid morning and at bedtime, Reinforced to try to get regular exercise and how it effects glycemic control     Peter Garter RN, [facility] Care Management Coordinator-Link to [place] Management [phone]

## 2015-08-27 NOTE — Patient Instructions (Signed)
1. Plan to  continue to check blood sugar 1-2 times a day with goals of 80-130 before eating and less than 180 after eating 1/1/2 to 2 hours 2. Plan to eat 30-45 gm (2-3 servings) carbohydrates a meal and 15 gm for snacks.   3. Plan to bring lunch 4 days a week. 4. Plan to walk 3 times a week 30 minutes.   Goal of 150 minutes a week.  Plan to move every 90 minutes. 5. Plan to complete EMMI by 10/07/15 6. Plan to keep appointment with Dr. Cruzita Lederer 10/24/15 7. Plan to return to Link to Wellness  on 11/26/15 at 11:15AM

## 2015-10-18 MED FILL — OMEPRAZOLE DR 40 MG CAPSULE: 40 | 30 days supply | Qty: 30 | Fill #2

## 2015-10-24 ENCOUNTER — Ambulatory Visit: Payer: 59 | Admitting: Internal Medicine

## 2015-11-12 ENCOUNTER — Encounter: Payer: Self-pay | Admitting: Internal Medicine

## 2015-11-19 ENCOUNTER — Telehealth: Payer: 59 | Admitting: Family

## 2015-11-19 DIAGNOSIS — R509 Fever, unspecified: Secondary | ICD-10-CM

## 2015-11-19 NOTE — Progress Notes (Signed)
Based on what you shared with me it looks like you have a serious condition that should be evaluated in a face to face office visit.  If you are having a true medical emergency please call 911.  If you need an urgent face to face visit, [place] has four urgent care centers for your convenience.  . [facility]  [phone] Get Driving Directions Find a Provider at this Location  [address] [place], [place] [address] . 8 am to 8 pm Monday-Friday . 9 am to 7 pm Saturday-Sunday  . [facility]  [phone] Get Driving Directions Find a Provider at this Location  [address], [address] [place], [place] [address] . 8 am to 8 pm Monday-Friday . 9 am to 6 pm Saturday . 11 am to 6 pm Sunday   . [facility] [place]  [phone] Get Driving Directions  [address] [place], [place] [address] . 8 am to 8 pm Monday-Friday . 9 am to 4 pm Saturday-Sunday   . [facility] (a walk in primary care provider)  [phone]  Get Driving Directions Find a Provider at this Location  [address] [place], [place] [address] . 8 am to 8:30 pm Monday-Thursday . 8 am to 6 pm Friday . 8 am to 4 pm Saturday-Sunday   Your e-visit answers were reviewed by a board certified advanced clinical practitioner to complete your personal care plan.  Thank you for using e-Visits. 

## 2015-11-26 ENCOUNTER — Ambulatory Visit: Payer: Self-pay

## 2015-12-02 ENCOUNTER — Telehealth: Payer: 59 | Admitting: Family

## 2015-12-02 DIAGNOSIS — J019 Acute sinusitis, unspecified: Secondary | ICD-10-CM

## 2015-12-02 MED ORDER — AMOXICILLIN-POT CLAVULANATE 875-125 MG PO TABS: 1.0000 | ORAL_TABLET | Freq: Two times a day (BID) | ORAL | Status: DC

## 2015-12-02 MED FILL — AMOX TR-K CLV 875-125 MG TA: 875-125 | 7 days supply | Qty: 14 | Fill #0

## 2015-12-02 MED FILL — OMEPRAZOLE DR 40 MG CAPSULE: 40 | 30 days supply | Qty: 30 | Fill #3

## 2015-12-02 MED FILL — glipiZIDE 5 MG TABS: 5 | 90 days supply | Qty: 180 | Fill #1

## 2015-12-02 MED FILL — METFORMIN HCL ER 500 MG TAB: 500 | 90 days supply | Qty: 360 | Fill #1

## 2015-12-02 MED FILL — LOSARTAN POTASSIUM 50 MG TA: 50 | 90 days supply | Qty: 90 | Fill #1

## 2015-12-02 MED FILL — INVOKANA 300 MG TABLET: 300 | 90 days supply | Qty: 90 | Fill #1

## 2015-12-02 NOTE — Progress Notes (Signed)

## 2015-12-17 ENCOUNTER — Ambulatory Visit: Payer: 59 | Admitting: Internal Medicine

## 2016-01-03 ENCOUNTER — Ambulatory Visit: Payer: 59 | Admitting: Internal Medicine

## 2016-01-23 ENCOUNTER — Other Ambulatory Visit: Payer: Self-pay

## 2016-01-23 VITALS — BP 114/76 | Resp 16 | Ht 63.0 in | Wt 235.8 lb

## 2016-01-23 DIAGNOSIS — E119 Type 2 diabetes mellitus without complications: Secondary | ICD-10-CM

## 2016-01-23 NOTE — Patient Instructions (Signed)
1. Plan to  continue to check blood sugar 1-2 times a day with goals of 80-130 before eating and less than 180 after eating 1/1/2 to 2 hours 2. Plan to eat 30-45 gm (2-3 servings) carbohydrates a meal and 15 gm for snacks.   3. Plan to bring lunch 4 days a week. 4. Plan to walk 3 times a week 30 minutes.   Goal of 150 minutes a week.  Plan to move every 90 minutes. 5. Plan to complete EMMI by 04/08/16 6. Plan to keep eye appointment in September 7. Plan to keep appointment with Dr. Cruzita Lederer 02/13/16 Plan to return to [facility]  on 05/07/16 at 11AM

## 2016-01-23 NOTE — Patient Outreach (Signed)
[facility]) Care Management   01/23/2016  GERMANI SINGERMAN 1960-09-27 [id number]  Angela Casey is an 55 y.o. female.   Member seen for follow up office visit for [facility] program for self management of Type 2 diabetes  Subjective: Member states that she has been under a lot of stress at work with budgets but states that they are done and she hopes things will slow down some now.  States she is to see Dr.Gherghe and have her eye exam in September.  States her blood sugars have not been too high and she only had one low reading of 70 in the last few months.  States that she usually had corn on the cob the night before on the days when her sugars are in the 170's range.  States that she has some new shoes that are very comfortable and she has been able to walk more.  States she is trying to walk 3 times a week.  Objective:   Review of Systems  Gastrointestinal: Positive for constipation.  Musculoskeletal: Positive for joint pain.  All other systems reviewed and are negative.   Physical Exam Today's Vitals   01/23/16 1115 01/23/16 1122  BP: 114/76   Resp: 16   Weight: 235 lb 12.8 oz (107 kg)   Height: 1.6 m (5\' 3" )   PainSc: 0-No pain 0-No pain   Encounter Medications:   Outpatient Encounter Prescriptions as of 01/23/2016  Medication Sig  . albuterol (PROVENTIL HFA;VENTOLIN HFA) 108 (90 Base) MCG/ACT inhaler Inhale into the lungs every 6 (six) hours as needed for wheezing or shortness of breath.  . canagliflozin (INVOKANA) 300 MG TABS tablet Take 1 tablet once a day by mouth  . glipiZIDE (GLUCOTROL) 5 MG tablet Take 1 tablet (5 mg total) by mouth 2 (two) times daily before a meal.  . glucose blood test strip Use 3x a day; True Test Strips.  Marland Kitchen ibuprofen (ADVIL,MOTRIN) 600 MG tablet Take 600 mg by mouth every 6 (six) hours as needed.  . Loratadine (CLARITIN PO) Take by mouth.  . losartan (COZAAR) 50 MG tablet Take 1 tablet (50 mg total) by mouth daily.  .  metFORMIN (GLUCOPHAGE XR) 500 MG 24 hr tablet Take 2 tablets (1,000 mg total) by mouth 2 (two) times daily.  Marland Kitchen omeprazole (PRILOSEC) 20 MG capsule Take 1 capsule (20 mg total) by mouth daily.  Marland Kitchen amoxicillin-clavulanate (AUGMENTIN) 875-125 MG tablet Take 1 tablet by mouth 2 (two) times daily. (Patient not taking: Reported on 01/23/2016)  . azithromycin (ZITHROMAX Z-PAK) 250 MG tablet As directed (Patient not taking: Reported on 08/27/2015)  . Pitavastatin Calcium 2 MG TABS Please take 1 tablet daily. (Patient not taking: Reported on 08/27/2015)   Facility-Administered Encounter Medications as of 01/23/2016  Medication  . albuterol (PROVENTIL) (2.5 MG/3ML) 0.083% nebulizer solution 2.5 mg  . ipratropium (ATROVENT) nebulizer solution 0.5 mg    Functional Status:   In your present state of health, do you have any difficulty performing the following activities: 01/23/2016 08/27/2015  Hearing? N N  Vision? N N  Difficulty concentrating or making decisions? N N  Walking or climbing stairs? N N  Dressing or bathing? N N  Doing errands, shopping? N N  Some recent data might be hidden    Fall/Depression Screening:    PHQ 2/9 Scores 01/23/2016 08/27/2015 04/23/2015  PHQ - 2 Score 0 0 0    Assessment:  Member seen for follow up office visit for [facility] program for self management of Type 2 diabetes. She is not meeting diabetes self management goal of 7% or below with last hemoglobin A1C 7.4% which is down form 8.9%.  Member had one episode of hypoglycemia with CBG of 70 and reported that she had not eaten much for breakfast.  CBGs ranging 101-188 fasting. Reports she is trying to adhere to low CHO diet but is eating more corn on the cob.  Reports walking 3 times a week for 30 minutes.  Member is to see endocrinologist on 02/13/16 and have eye exam in September.  Plan:  Plan to  continue to check blood sugar 1-2 times a day with goals of 80-130 before eating and less than 180 after eating 1/1/2 to  2 hours Plan to eat 30-45 gm (2-3 servings) carbohydrates a meal and 15 gm for snacks.   Plan to bring lunch 4 days a week. Plan to walk 3 times a week 30 minutes.   Goal of 150 minutes a week.  Plan to move every 90 minutes. Plan to complete EMMI by 04/08/16 Plan to keep eye appointment in September Plan to keep appointment with Dr. Cruzita Lederer 02/13/16       Plan to return to [facility]  on 05/07/16 at 11 AM  [facility] CM Care Plan Problem One   Flowsheet Row Most Recent Value  Care Plan Problem One  Elevated blood glucose as evidenced by hemoglobin A1C  related to dx of Type 2 DM  Role Documenting the Problem One  Care Management Coordinator  Care Plan for Problem One  Active  [facility] Long Term Goal (31-90 days)  Member will decrease hemoglobin A1C to 7 or below in the next 90 days  [facility] Long Term Goal Start Date  01/23/16 [Decreased hemoglobin A1C to 7.4]  Interventions for Problem One Long Term Goal  Reviewed CHO counting and portion control, Reviewed portion sizes of corn on the cob Reinforced to be sure to eat enough when  she takes her glipizide to help avoid hypoglycemia, Reinforced on s/s of hypoglycemia and actions to take to treat,  Reinforced to eat at regular times and to not skip meals, Reinforced to eat a snack with protein and CHO mid morning and at bedtime, Reinforced  to get regular exercise and how it effects glycemic control, Reinforced to keep eye appt in September and why regular dilated eye exams are important    Peter Garter RN, [facility] Care Management Coordinator-[facility] [phone]

## 2016-01-24 MED FILL — OMEPRAZOLE DR 40 MG CAPSULE: 40 | 30 days supply | Qty: 30 | Fill #4

## 2016-02-12 DIAGNOSIS — L309 Dermatitis, unspecified: Secondary | ICD-10-CM | POA: Diagnosis not present

## 2016-02-12 DIAGNOSIS — L719 Rosacea, unspecified: Secondary | ICD-10-CM | POA: Diagnosis not present

## 2016-02-12 MED FILL — TRIAMCINOLONE 0.1% OINTMENT: 0.1 | 10 days supply | Qty: 15 | Fill #0

## 2016-02-13 ENCOUNTER — Encounter: Payer: Self-pay | Admitting: Internal Medicine

## 2016-02-13 ENCOUNTER — Ambulatory Visit (INDEPENDENT_AMBULATORY_CARE_PROVIDER_SITE_OTHER): Payer: 59 | Admitting: Internal Medicine

## 2016-02-13 VITALS — BP 122/80 | HR 92 | Ht 64.0 in | Wt 235.0 lb

## 2016-02-13 DIAGNOSIS — E1165 Type 2 diabetes mellitus with hyperglycemia: Secondary | ICD-10-CM | POA: Diagnosis not present

## 2016-02-13 DIAGNOSIS — E785 Hyperlipidemia, unspecified: Secondary | ICD-10-CM | POA: Diagnosis not present

## 2016-02-13 LAB — POCT GLYCOSYLATED HEMOGLOBIN (HGB A1C): Hemoglobin A1C: 8.4

## 2016-02-13 MED ORDER — GLIPIZIDE 5 MG PO TABS: ORAL_TABLET | ORAL | 1 refills | Status: DC

## 2016-02-13 MED FILL — glipiZIDE 5 MG TABS: 5 | 90 days supply | Qty: 270 | Fill #0

## 2016-02-13 NOTE — Addendum Note (Signed)
Addended by: Caprice Beaver T on: 02/13/2016 01:14 PM   Modules accepted: Orders

## 2016-02-13 NOTE — Patient Instructions (Addendum)
Please continue: Metformin XR 2000 mg at dinnertime Invokana 300 mg daily in am  Please increase:   Glipizide 5 mg before b'fast and 10 mg before dinner  Retry Livalo.  Please return in 3 months with your sugar log.

## 2016-02-13 NOTE — Progress Notes (Signed)
Patient ID: Angela Casey, female   DOB: 1961/01/24, 55 y.o.   MRN: [id number]  HPI: Angela Casey is a 55 y.o.-year-old female, initially referred by her PCP, Dr. Wilhemena Durie, for management of DM2, dx 2010, non-insulin-dependent, uncontrolled, without long term complications. Last visit 7 mo ago.  Last hemoglobin A1c was: Lab Results  Component Value Date   HGBA1C 7.4 07/23/2015   HGBA1C 8.5 04/24/2015   HGBA1C 8.9 (H) 08/01/2014  - 09/25/2013: HbA1c 8.3% - 12/27/2012: HbA1c 9.2% - previously: HbA1c 8.4%  Pt is on a regimen of: - Metformin (Glucophage XR) 2000 mg at dinnertime - Invokana 300 mg - added 10/2013 - Glipizide 5 mg 2x a day (last dose at bedtime!) - started 03/2014 She tried Januvia - started 07/2014 >> constipation (Mg did not help) >> try to stop and restart >> sxs restarted >> had same sxs >> stopped 05/2015 She stopped Victoza 1.8 mg daily >> developed N/D/AP  Pt checks her sugars 2-3x a day: - am: 112-150s, then 7 am: 160-225 >> 118-130, 142 >> 135-180, 213 >> 134-209 >> 81, 110-155, 171 >> 107-163, 179, 206 - 2h after b'fast: n/c >> n/c >> 146-191 >> 131 >> 101-151 - before lunch: \140 >> n/c >> 139-162 >> 61 x1, 115-125 >> n/c >> 75, 124-139, 206 >> 80, 113-135, 141  >> 70-118 - 2h after lunch: n/c >> 146 >> 102, 228 - before dinner: n/c >> 107-168 >> 164-226 >> 106-120 >> 118-198, 210 >> 115-125 >> 125-193, 240 >> 84-200 >> n/c - 2h after dinner: n/c >> 91-196 - bedtime: 104-154 >> 175-248 >> 139-156, 210 >> up tp 160, 170, 198 x1 (after eating out) >> 250 >> n/c - nighttime: n/c No lows. Lowest sugar was 91 >> 80 >> 70; she has hypoglycemia awareness at 80.  Highest sugar was 400 x1 >> 190 >> 200s >> 198 >> 250 >> 206 >> 323x1 >> 206.  She was taking nutrition classes before, [facility].  - no CKD, last BUN/creatinine:  Lab Results  Component Value Date   BUN 23 (H) 06/26/2015   Lab Results  Component Value Date   CREATININE 0.72  06/26/2015  On Losartan. - no Lipid panel: Lab Results  Component Value Date   CHOL 115 04/24/2015   HDL 31.70 (L) 04/24/2015   LDLCALC 55 04/24/2015   TRIG 146.0 04/24/2015   CHOLHDL 4 04/24/2015  She is on Simvastatin.  - last eye exam was in 07/25/2014. No DR.  - no numbness and tingling in her feet.   I reviewed pt's medications, allergies, PMH, social hx, family hx, and changes were documented in the history of present illness. Otherwise, unchanged from my initial visit note.  ROS: Constitutional: weight stable, no fatigue, no subjective hyperthermia/hypothermia, no increased urination Eyes: no blurry vision, no xerophthalmia ENT: no sore throat, no nodules palpated in throat, no dysphagia/odynophagia, no hoarseness Cardiovascular: no CP/SOB/palpitations/leg swelling Respiratory: no cough/SOB Gastrointestinal: no N/no V/no D/C Musculoskeletal: no muscle/joint aches Skin: + rash on L shin >> sees derm  - eczema Neurological: no tremors/numbness/tingling/dizziness  PE: BP 122/80 (BP Location: Left Arm, Patient Position: Sitting)   Pulse 92   Ht 5\' 4"  (1.626 m)   Wt 235 lb (106.6 kg)   SpO2 96%   BMI 40.34 kg/m  Body mass index is 40.34 kg/m.  Wt Readings from Last 3 Encounters:  02/13/16 235 lb (106.6 kg)  01/23/16 235 lb 12.8 oz (107 kg)  08/27/15  235 lb 3.2 oz (106.7 kg)   Constitutional: obese, in NAD, full [place] fat pads Eyes: PERRLA, EOMI, no exophthalmos ENT: moist mucous membranes, no thyromegaly, no cervical lymphadenopathy Cardiovascular: RRR, No MRG Respiratory: CTA B Gastrointestinal: abdomen soft, NT, ND, BS+ Musculoskeletal: no deformities, strength intact in all 4 Skin: moist, warm, no rashes  ASSESSMENT: 1. DM2, non-insulin-dependent, uncontrolled, with hyperglycemia  2. HL  PLAN:  1. Patient with suboptimally controlled DM2, now with worse control  - high sugars in am and probably at bedtime although she does not check then. I advised her to  start checking; will also increase the Glipizide in the evening and move the dose beofre dinner (now taking it at bedtime!).  - I suggested: Patient Instructions  Please continue: Metformin XR 2000 mg at dinnertime Invokana 300 mg daily in am  Please increase:   Glipizide 5 mg before b'fast and 10 mg before dinner  Retry Livalo.  Please return in 3 months with your sugar log.   - no SEs from Invokana - continue checking sugars at different times of the day - check 1-2 times a day, rotating checks - advised for yearly eye exams - she scheduled a new one - check HbA1c today >> 8.4% (increased!) - Return to clinic in 3 mo with sugar log    2. HL - was on Livalo >> AP >> stopped >> restarted >> constipation >> stopped it again - she plans to restart Livalo as she feels this helped her sugars, also  Philemon Kingdom, MD PhD [facility] Endocrinology

## 2016-02-21 ENCOUNTER — Other Ambulatory Visit: Payer: Self-pay | Admitting: Family Medicine

## 2016-02-21 DIAGNOSIS — Z1231 Encounter for screening mammogram for malignant neoplasm of breast: Secondary | ICD-10-CM

## 2016-02-26 ENCOUNTER — Encounter: Payer: Self-pay | Admitting: Internal Medicine

## 2016-02-28 ENCOUNTER — Telehealth: Payer: 59 | Admitting: Family

## 2016-02-28 DIAGNOSIS — T7840XA Allergy, unspecified, initial encounter: Secondary | ICD-10-CM | POA: Diagnosis not present

## 2016-02-28 DIAGNOSIS — Z888 Allergy status to other drugs, medicaments and biological substances status: Secondary | ICD-10-CM

## 2016-02-28 MED ORDER — PREDNISONE 10 MG (21) PO TBPK: 10.0000 mg | ORAL_TABLET | ORAL | 0 refills | Status: DC

## 2016-02-28 MED FILL — predniSONE 10 MG TABS: 10 | 6 days supply | Qty: 21 | Fill #0

## 2016-02-28 NOTE — Progress Notes (Signed)
E Visit for Rash  We are sorry that you are not feeling well. Here is how we plan to help!  I have prescribed Prednisone pack taper for 7 days  I recommend you take Benadryl 25 mg - 50 mg every 4 hours to control the symptoms but if they last over 24 hours it is best that you see an office based provider for follow up.    HOME CARE:   Take cool showers and avoid direct sunlight.  Apply cool compress or wet dressings.  Take a bath in an oatmeal bath.  Sprinkle content of one Aveeno packet under running faucet with comfortably warm water.  Bathe for 15-20 minutes, 1-2 times daily.  Pat dry with a towel. Do not rub the rash.  Use hydrocortisone cream.  Take an antihistamine like Benadryl for widespread rashes that itch.  The adult dose of Benadryl is 25-50 mg by mouth 4 times daily.  Caution:  This type of medication may cause sleepiness.  Do not drink alcohol, drive, or operate dangerous machinery while taking antihistamines.  Do not take these medications if you have prostate enlargement.  Read package instructions thoroughly on all medications that you take.  GET HELP RIGHT AWAY IF:   Symptoms don't go away after treatment.  Severe itching that persists.  If you rash spreads or swells.  If you rash begins to smell.  If it blisters and opens or develops a yellow-brown crust.  You develop a fever.  You have a sore throat.  You become short of breath.  MAKE SURE YOU:  Understand these instructions. Will watch your condition. Will get help right away if you are not doing well or get worse.  Thank you for choosing an e-visit. Your e-visit answers were reviewed by a board certified advanced clinical practitioner to complete your personal care plan. Depending upon the condition, your plan could have included both over the counter or prescription medications. Please review your pharmacy choice. Be sure that the pharmacy you have chosen is open so that you can pick up your  prescription now.  If there is a problem you may message your provider in [place] to have the prescription routed to another pharmacy. Your safety is important to [place]. If you have drug allergies check your prescription carefully.  For the next 24 hours, you can use MyChart to ask questions about today's visit, request a non-urgent call back, or ask for a work or school excuse from your e-visit provider. You will get an email in the next two days asking about your experience. I hope that your e-visit has been valuable and will speed your recovery.

## 2016-03-12 DIAGNOSIS — L509 Urticaria, unspecified: Secondary | ICD-10-CM | POA: Diagnosis not present

## 2016-03-12 DIAGNOSIS — L508 Other urticaria: Secondary | ICD-10-CM | POA: Diagnosis not present

## 2016-03-12 MED FILL — FLUOCINONIDE 0.05% OINTMENT: 0.05 | 30 days supply | Qty: 60 | Fill #0

## 2016-03-24 ENCOUNTER — Other Ambulatory Visit: Payer: Self-pay | Admitting: Internal Medicine

## 2016-03-24 MED FILL — METFORMIN HCL ER 500 MG TAB: 500 | 90 days supply | Qty: 360 | Fill #1

## 2016-03-25 MED FILL — SIMVASTATIN 20 MG TABLET: 20 | 90 days supply | Qty: 90 | Fill #0

## 2016-04-07 MED FILL — OMEPRAZOLE DR 40 MG CAPSULE: 40 | 30 days supply | Qty: 30 | Fill #5

## 2016-04-09 DIAGNOSIS — E119 Type 2 diabetes mellitus without complications: Secondary | ICD-10-CM | POA: Diagnosis not present

## 2016-04-09 DIAGNOSIS — H524 Presbyopia: Secondary | ICD-10-CM | POA: Diagnosis not present

## 2016-04-09 DIAGNOSIS — H5213 Myopia, bilateral: Secondary | ICD-10-CM | POA: Diagnosis not present

## 2016-04-09 DIAGNOSIS — Z135 Encounter for screening for eye and ear disorders: Secondary | ICD-10-CM | POA: Diagnosis not present

## 2016-04-29 ENCOUNTER — Other Ambulatory Visit: Payer: Self-pay | Admitting: Internal Medicine

## 2016-04-29 MED FILL — LOSARTAN POTASSIUM 50 MG TA: 50 | 90 days supply | Qty: 90 | Fill #0

## 2016-05-07 ENCOUNTER — Ambulatory Visit: Payer: Self-pay

## 2016-05-07 DIAGNOSIS — L309 Dermatitis, unspecified: Secondary | ICD-10-CM | POA: Diagnosis not present

## 2016-05-07 MED FILL — DOXYCYCLINE HYCLATE 100 MG: 100 | 30 days supply | Qty: 30 | Fill #0

## 2016-05-07 MED FILL — CLOBETASOL 0.05% CREAM: 0.05 | 30 days supply | Qty: 90 | Fill #0

## 2016-05-21 ENCOUNTER — Encounter (INDEPENDENT_AMBULATORY_CARE_PROVIDER_SITE_OTHER): Payer: 59 | Admitting: Family Medicine

## 2016-05-22 ENCOUNTER — Ambulatory Visit
Admission: RE | Admit: 2016-05-22 | Discharge: 2016-05-22 | Disposition: A | Discharge status: Home / Self Care | Payer: 59 | Source: Ambulatory Visit | Attending: Family Medicine | Admitting: Family Medicine

## 2016-05-22 DIAGNOSIS — Z1231 Encounter for screening mammogram for malignant neoplasm of breast: Secondary | ICD-10-CM | POA: Diagnosis not present

## 2016-05-26 MED FILL — OMEPRAZOLE DR 40 MG CAPSULE: 40 | 30 days supply | Qty: 30 | Fill #6

## 2016-05-27 ENCOUNTER — Ambulatory Visit (INDEPENDENT_AMBULATORY_CARE_PROVIDER_SITE_OTHER): Payer: 59 | Admitting: Internal Medicine

## 2016-05-27 ENCOUNTER — Encounter: Payer: Self-pay | Admitting: Internal Medicine

## 2016-05-27 VITALS — BP 122/80 | HR 90 | Ht 64.0 in | Wt 236.2 lb

## 2016-05-27 DIAGNOSIS — E782 Mixed hyperlipidemia: Secondary | ICD-10-CM | POA: Diagnosis not present

## 2016-05-27 DIAGNOSIS — E1165 Type 2 diabetes mellitus with hyperglycemia: Secondary | ICD-10-CM

## 2016-05-27 LAB — POCT GLYCOSYLATED HEMOGLOBIN (HGB A1C): Hemoglobin A1C: 9.2

## 2016-05-27 NOTE — Patient Instructions (Addendum)
Please continue: Metformin XR 2000 mg at dinnertime Invokana 300 mg daily in am   Glipizide 5 mg before b'fast and 10 mg before dinner  Please join [employer].  Try to work on diet and exercise.  Please return in 3 months with your sugar log.

## 2016-05-27 NOTE — Progress Notes (Signed)
Patient ID: Angela Casey, female   DOB: 1961/04/25, 55 y.o.   MRN: [id number]  HPI: Angela Casey is a 55 y.o.-year-old female, initially referred by her PCP, Dr. Wilhemena Durie, for management of DM2, dx 2010, non-insulin-dependent, uncontrolled, without long term complications. Last visit 3 mo ago.  She had 2 x steroid tapers for a rash. Last taper in 04/2016.  She has been stressed at work.  Had a rash on Livalo.  Last hemoglobin A1c was: Lab Results  Component Value Date   HGBA1C 8.4 02/13/2016   HGBA1C 7.4 07/23/2015   HGBA1C 8.5 04/24/2015  - 09/25/2013: HbA1c 8.3% - 12/27/2012: HbA1c 9.2% - previously: HbA1c 8.4%  Pt is on a regimen of: - Metformin (Glucophage XR) 2000 mg at dinnertime - Invokana 300 mg - added 10/2013 - Glipizide 5 mg in am and 10 mg before dinner She tried Januvia - started 07/2014 >> constipation (Mg did not help) >> try to stop and restart >> sxs restarted >> had same sxs >> stopped 05/2015 She stopped Victoza 1.8 mg daily >> developed N/D/AP  Pt checks her sugars 2-3x a day: - am: 135-180, 213 >> 134-209 >> 81, 110-155, 171 >> 107-163, 179, 206 >> 140-170 - 2h after b'fast: n/c >> n/c >> 146-191 >> 131 >> 101-151 >> n/c - before lunch: 75, 124-139, 206 >> 80, 113-135, 141  >> 70-118 >> 120s - 2h after lunch: n/c >> 146 >> 102, 228 >> n/c - before dinner: 118-198, 210 >> 115-125 >> 125-193, 240 >> 84-200 >> n/c >> 130-150 - 2h after dinner: n/c >> 91-196 >> n/c - bedtime: 139-156, 210 >> up tp 160, 170, 198 x1 (after eating out) >> 250 >> n/c >> 170- 200 - nighttime: n/c No lows. Lowest sugar was 70 >> 120; she has hypoglycemia awareness at 80.  Highest sugar was 206 >> 200.  She was taking nutrition classes before, [facility] to [facility].  - no CKD, last BUN/creatinine:  Lab Results  Component Value Date   BUN 23 (H) 06/26/2015   Lab Results  Component Value Date   CREATININE 0.72 06/26/2015  On Losartan. - no Lipid panel: Lab Results   Component Value Date   CHOL 115 04/24/2015   HDL 31.70 (L) 04/24/2015   LDLCALC 55 04/24/2015   TRIG 146.0 04/24/2015   CHOLHDL 4 04/24/2015  She is on Simvastatin.  - last eye exam was in 04/2016. No DR.  - no numbness and tingling in her feet.   I reviewed pt's medications, allergies, PMH, social hx, family hx, and changes were documented in the history of present illness. Otherwise, unchanged from my initial visit note.  ROS: Constitutional: weight stable, no fatigue, no subjective hyperthermia/hypothermia, no increased urination Eyes: no blurry vision, no xerophthalmia ENT: no sore throat, no nodules palpated in throat, no dysphagia/odynophagia, no hoarseness Cardiovascular: no CP/SOB/palpitations/leg swelling Respiratory: no cough/SOB Gastrointestinal: no N/no V/no D/C Musculoskeletal: no muscle/joint aches Skin: no rashes Neurological: no tremors/numbness/tingling/dizziness  PE: BP 122/80   Pulse 90   Ht 5\' 4"  (1.626 m)   Wt 236 lb 3.2 oz (107.1 kg)   SpO2 97%   BMI 40.54 kg/m  Body mass index is 40.54 kg/m.  Wt Readings from Last 3 Encounters:  05/27/16 236 lb 3.2 oz (107.1 kg)  02/13/16 235 lb (106.6 kg)  01/23/16 235 lb 12.8 oz (107 kg)   Constitutional: obese, in NAD, full [place] fat pads Eyes: PERRLA, EOMI, no exophthalmos ENT: moist mucous  membranes, no thyromegaly, no cervical lymphadenopathy Cardiovascular: RRR, No MRG Respiratory: CTA B Gastrointestinal: abdomen soft, NT, ND, BS+ Musculoskeletal: no deformities, strength intact in all 4 Skin: moist, warm, no rashes  ASSESSMENT: 1. DM2, non-insulin-dependent, uncontrolled, with hyperglycemia  2. HL  PLAN:  1. Patient with suboptimally controlled DM2, now with worse control  - I suggested a weekly GLP1R agonist but she wouls like to try diet and exercise for 3 months first. She will also join [employer] at work. - I suggested: Patient Instructions  Please continue: Metformin XR 2000 mg at  dinnertime Invokana 300 mg daily in am   Glipizide 5 mg before b'fast and 10 mg before dinner  Please join [employer].  Try to work on diet and exercise.  Please return in 3 months with your sugar log.  - no SEs from Invokana - continue checking sugars at different times of the day - check 1-2 times a day, rotating checks - advised for yearly eye exams - UTD - check HbA1c today >> 9.2% (increased!) - Return to clinic in 3 mo with sugar log    2. HL - was on Livalo >> AP and constipation >> stopped it >> restarted >> rash >> now off again.  Philemon Kingdom, MD PhD [facility] Endocrinology

## 2016-06-05 DIAGNOSIS — Z01419 Encounter for gynecological examination (general) (routine) without abnormal findings: Secondary | ICD-10-CM | POA: Diagnosis not present

## 2016-06-05 DIAGNOSIS — Z6841 Body Mass Index (BMI) 40.0 and over, adult: Secondary | ICD-10-CM | POA: Diagnosis not present

## 2016-06-10 ENCOUNTER — Ambulatory Visit (INDEPENDENT_AMBULATORY_CARE_PROVIDER_SITE_OTHER): Payer: 59 | Admitting: Podiatry

## 2016-06-10 ENCOUNTER — Encounter: Payer: Self-pay | Admitting: Podiatry

## 2016-06-10 ENCOUNTER — Ambulatory Visit (INDEPENDENT_AMBULATORY_CARE_PROVIDER_SITE_OTHER): Payer: 59

## 2016-06-10 VITALS — BP 109/81 | HR 109

## 2016-06-10 DIAGNOSIS — M779 Enthesopathy, unspecified: Secondary | ICD-10-CM

## 2016-06-10 DIAGNOSIS — M7750 Other enthesopathy of unspecified foot: Secondary | ICD-10-CM | POA: Diagnosis not present

## 2016-06-10 DIAGNOSIS — M7752 Other enthesopathy of left foot: Secondary | ICD-10-CM

## 2016-06-10 DIAGNOSIS — R52 Pain, unspecified: Secondary | ICD-10-CM | POA: Diagnosis not present

## 2016-06-10 DIAGNOSIS — G5792 Unspecified mononeuropathy of left lower limb: Secondary | ICD-10-CM | POA: Diagnosis not present

## 2016-06-10 DIAGNOSIS — G5762 Lesion of plantar nerve, left lower limb: Secondary | ICD-10-CM | POA: Diagnosis not present

## 2016-06-21 MED ORDER — BETAMETHASONE SOD PHOS & ACET 6 (3-3) MG/ML IJ SUSP: 3.0000 mg | Freq: Once | INTRAMUSCULAR | Status: DC

## 2016-06-21 NOTE — Progress Notes (Signed)
Subjective:  Patient presents today as a new patient for evaluation of left foot pain. Patient describes throbbing in the ball of her foot for approximately the-9 months now. There are no alleviating symptoms. Patient presents today for further treatment and evaluation    Objective/Physical Exam General: The patient is alert and oriented x3 in no acute distress.  Dermatology: Skin is warm, dry and supple bilateral lower extremities. Negative for open lesions or macerations.  Vascular: Palpable pedal pulses bilaterally. No edema or erythema noted. Capillary refill within normal limits.  Neurological: Epicritic and protective threshold grossly intact bilaterally.   Musculoskeletal Exam: Pain on palpation noted to the insertion of the peroneal brevis into the fifth metatarsal tubercle left foot. Pain with forced eversion of the forefoot as well. There is also pain in the fourth interdigital space of the left foot with compression of the metatarsal heads consistent with a positive Mulder sign.  Radiographic Exam:  Normal osseous mineralization. Joint spaces preserved. No fracture/dislocation/boney destruction.    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 #2 injection 0.5 mL Celestone Soluspan injected into the insertional site of the peroneal tendon left foot at the fifth metatarsal tubercle #3 injection of 0.5 mL Cel[place] Soluspan injected into the fourth interspace left foot #4 recommend wider shoes and arch supports. #5 patient is going to wait for custom molded orthotics. #6 discussed custom molded orthotics on next Visit. Return to clinic in 4 weeks   Edrick Kins, DPM [facility]  Dr. Edrick Kins, [address] [place]                                        [place], [place] [address]                Office [phone]  Fax [phone]

## 2016-07-01 ENCOUNTER — Ambulatory Visit: Payer: 59 | Admitting: Podiatry

## 2016-07-06 ENCOUNTER — Other Ambulatory Visit: Payer: Self-pay | Admitting: Internal Medicine

## 2016-07-06 MED FILL — METFORMIN HCL ER 500 MG TAB: 500 | 90 days supply | Qty: 360 | Fill #0

## 2016-08-04 ENCOUNTER — Ambulatory Visit: Payer: 59 | Admitting: Allergy and Immunology

## 2016-08-05 ENCOUNTER — Encounter: Payer: Self-pay | Admitting: Allergy & Immunology

## 2016-08-05 ENCOUNTER — Ambulatory Visit (INDEPENDENT_AMBULATORY_CARE_PROVIDER_SITE_OTHER): Payer: 59 | Admitting: Allergy & Immunology

## 2016-08-05 VITALS — BP 125/80 | HR 83 | Temp 98.2°F | Resp 18 | Ht 63.39 in | Wt 230.6 lb

## 2016-08-05 DIAGNOSIS — L508 Other urticaria: Secondary | ICD-10-CM | POA: Diagnosis not present

## 2016-08-05 DIAGNOSIS — L2084 Intrinsic (allergic) eczema: Secondary | ICD-10-CM

## 2016-08-05 DIAGNOSIS — J3089 Other allergic rhinitis: Secondary | ICD-10-CM

## 2016-08-05 LAB — CBC WITH DIFFERENTIAL/PLATELET
BASOS PCT: 0 %
Basophils Absolute: 0 cells/uL (ref 0–200)
Eosinophils Absolute: 480 cells/uL (ref 15–500)
Eosinophils Relative: 6 %
HEMATOCRIT: 40.6 % (ref 35.0–45.0)
Hemoglobin: 13.2 g/dL (ref 11.7–15.5)
LYMPHS ABS: 2400 {cells}/uL (ref 850–3900)
LYMPHS PCT: 30 %
MCH: 26.5 pg — ABNORMAL LOW (ref 27.0–33.0)
MCHC: 32.5 g/dL (ref 32.0–36.0)
MCV: 81.4 fL (ref 80.0–100.0)
MONO ABS: 640 {cells}/uL (ref 200–950)
MPV: 9.4 fL (ref 7.5–12.5)
Monocytes Relative: 8 %
NEUTROS ABS: 4480 {cells}/uL (ref 1500–7800)
Neutrophils Relative %: 56 %
Platelets: 367 10*3/uL (ref 140–400)
RBC: 4.99 MIL/uL (ref 3.80–5.10)
RDW: 14.5 % (ref 11.0–15.0)
WBC: 8 10*3/uL (ref 3.8–10.8)

## 2016-08-05 NOTE — Progress Notes (Signed)
NEW PATIENT  Date of Service/Encounter:  08/06/16  Referring provider: Cori Razor, MD   Assessment:   Chronic urticaria  Perennial allergic rhinitis  Intrinsic atopic dermatitis   Plan/Recommendations:   1. Chronic urticaria - Your history does not have any "red flags" such as fevers, joint pains, or permanent skin changes that would be concerning for a more serious cause of hives.  - We will get some labs to rule out serious causes of hives: complete blood count, tryptase level, chronic urticaria panel, ANA, ESR, and CRP. - Chronic hives are often times a self limited process and will "burn themselves out" over 6-12 months, although this is not always the case.  - In the meantime, start suppressive dosing of antihistamines: Allegra (fexofenadine) 364m (two tablets) OR Xyzal (levocetirizine) 138m(two tablets) in the morning and Zyrtec (cetirizine) 2032mtwo tablets) in the evening. - You can change this dosing at home, decreasing the dose as needed or increasing the dosing as needed.  - We will work on starting the approval process for DupMorrill- We will send in a prescription for AuviQ (epinephrine).  - Start the prednisone pack today to provide some relief.  2. Perennial allergic rhinitis - Testing was positive to grasses, weeds, trees, dust mite, cat, dog, horse, cockroach - Avoidance measures discussed. - The antihistamines that you will be using for hives will likely provide some relief. - We can also add Flonase 2 sprays per nostril once daily to help as well. - Immunotherapy discussed, but at this point her allergic rhinitis symptoms are less pivotal than her dermatologic symptoms.   3. Intrinsic atopic dermatitis - The Dupixent will help with your atopic dermatitis. - We will work on getting approval when this has been done. - In the meantime, continue with triamcinolone as needed.  4. Return in about 3 months (around 11/02/2016).   Subjective:   Angela Casey a 55 34o. female presenting today for evaluation of  Chief Complaint  Patient presents with  . Allergic Reaction    Angela SANTOLIs a history of the following: Patient Active Problem List   Diagnosis Date Noted  . Chronic urticaria 08/06/2016  . Perennial allergic rhinitis 08/06/2016  . Intrinsic atopic dermatitis 08/06/2016  . Hyperlipidemia 02/13/2016  . RAD (reactive airway disease) 07/02/2013  . BMI 40.0-44.9, adult ([facility]/25/2015  . Type 2 diabetes mellitus, uncontrolled ([facility]22/2015    History obtained from: chart review and patient.  Angela Casey referred by JonCori RazorD.     Angela Casey a 55 27o. female presenting for a rash and itching. This first started in late September. It started within days of initiating treatment with pitavastatin (brand name Livalo). She developed a rash on her arms, back, and genital areas. She stopped the Livalo after three days. She received a steroid dose pack from Urgent Care that did resolve the rash although it quickly returned upon cessation of the steroids. [facility] was diagnosed with hives and treated with Xyzal, benadryl, and Claritin. This provided some relief. The lesions resolve to normal skin when the resolve. There are no fevers or joint pains associated with these symptoms. She was given a steroid cream as well (clobetasol), which did not help the rash at all. She was also given doxycyline to "help calm the nerve endings" which she took with resulting stomach pain. She remains on the Xyzal, benadryl ,and Claritin but  has not taken any of them since Firday. She uses Gold [place], Aveeno lotion, and Aveeno soap. Aside from the doxycyline, she has had no antibiotic use. Dermatologist was [facility].    Overall she is unsure what is triggering her symptoms. She feels that this might be related to initiation of the Livalo, however it has continued since stopping that mediation.  She is back on Simvastatin now, which she had previously tolerated for around 4-5 years. She has not noticed a difference with any foods. She eats peanuts, tree nuts, milk, eggs, seafood, wheat, and soy without any problems. Her soy ingestion is mostly in the form of processed foods.She has not made any recent soap or detergent changes.   She does have a history of obesity but has lost 10-12 pounds since Christmas. She is on metformin for diabetes. She has allergic rhinitis symptoms that she knows are definitely related to cats. She had a cat until December when she had to put hers down after 18 years. She takes Claritin on a daily basis for this. She has never been on a nasal spray consistently. She was tested 30+ years ago. Of note, she does had a history of eczema that started when she was 56 years of age. Since that time, it has generally worsened. She has tried a multitude of steroid ointments including triamcinolone, hydrocortisone, clobetasol, and others without improvement. She has also tried non-steroidals including pimecrolimus and tacrolimus.   Otherwise, there is no history of other atopic diseases, including asthma, drug allergies, food allergies, or stinging insect allergies. There is no significant infectious history. Vaccinations are up to date.   Pictures of the urticarial rash:       Picture of the eczema (on bilateral lower legs):    Past Medical History: Patient Active Problem List   Diagnosis Date Noted  . Chronic urticaria 08/06/2016  . Perennial allergic rhinitis 08/06/2016  . Intrinsic atopic dermatitis 08/06/2016  . Hyperlipidemia 02/13/2016  . RAD (reactive airway disease) 07/02/2013  . BMI 40.0-44.9, adult ([place]) 07/02/2013  . Type 2 diabetes mellitus, uncontrolled ([place]) 06/29/2013    Medication List:  Allergies as of 08/05/2016      Reactions   Exenatide Nausea And Vomiting   diarrhea   Pollen Extract    Other reaction(s): sneezing   Livalo [pitavastatin]  Itching, Rash      Medication List       Accurate as of 08/05/16 11:59 PM. Always use your most recent med list.          albuterol 108 (90 Base) MCG/ACT inhaler Commonly known as:  PROVENTIL HFA;VENTOLIN HFA Inhale into the lungs every 6 (six) hours as needed for wheezing or shortness of breath.   canagliflozin 300 MG Tabs tablet Commonly known as:  INVOKANA Take 1 tablet once a day by mouth   CLARITIN 10 MG tablet Generic drug:  loratadine Take by mouth.   clobetasol cream 0.05 % Commonly known as:  TEMOVATE   freestyle lancets 1 each by Other route as needed. Check bs twice daily   glipiZIDE 5 MG tablet Commonly known as:  GLUCOTROL Take 1 tablet before b'fast and 2 tablets before dinner.   glucose blood test strip Use 3x a day; True Test Strips.   ibuprofen 600 MG tablet Commonly known as:  ADVIL,MOTRIN Take 600 mg by mouth every 6 (six) hours as needed.   losartan 50 MG tablet Commonly known as:  COZAAR TAKE 1 TABLET (50 MG TOTAL) BY MOUTH  DAILY.   metFORMIN 500 MG 24 hr tablet Commonly known as:  GLUCOPHAGE XR Take 2 tablets (1,000 mg total) by mouth 2 (two) times daily.   metFORMIN 500 MG 24 hr tablet Commonly known as:  GLUCOPHAGE-XR TAKE 2 TABLETS BY MOUTH TWICE DAILY   omeprazole 40 MG capsule Commonly known as:  PRILOSEC   simvastatin 20 MG tablet Commonly known as:  ZOCOR TAKE 1 TABLET (20 MG TOTAL) BY MOUTH AT BEDTIME.       Birth History: non-contributory.  Developmental History: non-contributory.  Past Surgical History: Past Surgical History:  Procedure Laterality Date  . ANTERIOR CRUCIATE LIGAMENT REPAIR Right   . BACK SURGERY     L5-S1  . CARPAL TUNNEL RELEASE    . KNEE ARTHROSCOPY Left 06/28/2015   Procedure: Left Knee Arthroscopy and Debridement;  Surgeon: Meredith Pel, MD;  Location: [facility];  Service: Orthopedics;  Laterality: Left;  . KNEE ARTHROSCOPY WITH MEDIAL MENISECTOMY Left 06/28/2015    Procedure: KNEE ARTHROSCOPY WITH MEDIAL MENISECTOMY;  Surgeon: Meredith Pel, MD;  Location: [facility];  Service: Orthopedics;  Laterality: Left;  . KNEE SURGERY       Family History: Family History  Problem Relation Age of Onset  . Cancer Mother     uterine  . Diabetes Father   . COPD Father   . Asthma Father   . Diabetes Paternal Grandfather   . Allergic rhinitis Neg Hx   . Angioedema Neg Hx   . Atopy Neg Hx   . Eczema Neg Hx   . Immunodeficiency Neg Hx   . Urticaria Neg Hx      Social History: Angela Casey lives at home by herself. She lives at home by herself in a home that is 55 years old. There is carpeting throughout the home. There is no mildew in the home, and there are no roach is in the home. She did have a The past away in December 2017. She has gas heating and central cooling. There are no dust mite covers on the bedding and no tobacco exposure. 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   Review of Systems: a 14-point review of systems is pertinent for what is mentioned in HPI.  Otherwise, all other systems were negative. Constitutional: negative other than that listed in the HPI Eyes: negative other than that listed in the HPI Ears, nose, mouth, throat, and face: negative other than that listed in the HPI Respiratory: negative other than that listed in the HPI Cardiovascular: negative other than that listed in the HPI Gastrointestinal: negative other than that listed in the HPI Genitourinary: negative other than that listed in the HPI Integument: negative other than that listed in the HPI Hematologic: negative other than that listed in the HPI Musculoskeletal: negative other than that listed in the HPI Neurological: negative other than that listed in the HPI Allergy/Immunologic: negative other than that listed in the HPI    Objective:   Blood pressure 125/80, pulse 83, temperature 98.2 F (36.8  C), temperature source Oral, resp. rate 18, height 5' 3.39" (1.61 m), weight 230 lb 9.6 oz (104.6 kg), SpO2 98 %. Body mass index is 40.35 kg/m.   Physical Exam:  General: Alert, interactive, in no acute distress. Pleasant obese female. Eyes: No conjunctival injection present on the right, No conjunctival injection present on the left, PERRL bilaterally, No discharge on the right, No discharge on the left and No  Horner-Trantas dots present Ears: Right TM pearly gray with normal light reflex, Left TM pearly gray with normal light reflex, Right TM intact without perforation and Left TM intact without perforation.  Nose/Throat: External nose within normal limits and septum midline, turbinates edematous and pale with clear discharge, post-pharynx erythematous without cobblestoning in the posterior oropharynx. Tonsils 2+ without exudates Neck: Supple without thyromegaly. Adenopathy: no enlarged lymph nodes appreciated in the anterior cervical, occipital, axillary, epitrochlear, inguinal, or popliteal regions Lungs: Clear to auscultation without wheezing, rhonchi or rales. No increased work of breathing. CV: Normal S1/S2, no murmurs. Capillary refill <2 seconds.  Abdomen: Nondistended, nontender. No guarding or rebound tenderness. Bowel sounds present in all fields and hyperactive  Skin: Dry, erythematous, excoriated patches on the bilateral shins with icthyosis noted. There are multiple urticarial lesions with excoriations and subsequent papules with crusting on the bilateral arms.  Extremities:  No clubbing, cyanosis or edema. Neuro:   Grossly intact. No focal deficits appreciated. Responsive to questions.  Diagnostic studies:   Allergy Studies:   Indoor/Outdoor Percutaneous Adult Environmental Panel: positive to [place] blue grass, meadow fescue grass, perennial rye grass, sweet vernal grass, timothy grass, common mugwort, ash, birch, maple, oak, pecan pollen, Df mite, Dp mites, cat, dog,  horse and cockroach. Otherwise negative with adequate controls.  Most Common Foods Panel (peanut, tree nut, soy, fish mix, shellfish mix, wheat, milk, egg): Negative with adequate controls       Salvatore Marvel, MD [facility]

## 2016-08-05 NOTE — Patient Instructions (Addendum)
1. Chronic urticaria - Your history does not have any "red flags" such as fevers, joint pains, or permanent skin changes that would be concerning for a more serious cause of hives.  - We will get some labs to rule out serious causes of hives: complete blood count, tryptase level, chronic urticaria panel, ANA, ESR, and CRP. - Chronic hives are often times a self limited process and will "burn themselves out" over 6-12 months, although this is not always the case.  - In the meantime, start suppressive dosing of antihistamines: Allegra (fexofenadine) '360mg'$  (two tablets) OR Xyzal (levocetirizine) '10mg'$  (two tablets) in the morning and Zyrtec (cetirizine) '20mg'$  (two tablets) in the evening. - You can change this dosing at home, decreasing the dose as needed or increasing the dosing as needed.  - We will work on starting the approval process for [place].  - We will send in a prescription for AuviQ (epinephrine).  - Start the prednisone pack today to provide some relief.  2. Perennial allergic rhinitis - Testing was positive to grasses, weeds, trees, dust mite, cat, dog, horse, cockroach - Avoidance measures discussed. - The antihistamines that he will be using a hives will likely provide some relief. - We can also add Flonase 2 sprays per nostril once daily to help as well.  3. Intrinsic atopic dermatitis - The Dupixent will help with your atopic dermatitis. - We will work on getting approval when this has been done. - In the meantime, continue with triamcinolone as needed.  4. Return in about 3 months (around 11/02/2016).  Please inform us of any Emergency Department visits, hospitalizations, or changes in symptoms. Call us before going to the ED for breathing or allergy symptoms since we might be able to fit you in for a sick visit. Feel free to contact us anytime with any questions, problems, or concerns.  It was a pleasure to meet you today!   Websites that have reliable patient  information: 1. American Academy of Asthma, Allergy, and Immunology: www.aaaai.org 2. Food Allergy Research and Education (FARE): foodallergy.org 3. Mothers of Asthmatics: http://www.asthmacommunitynetwork.org 4. American College of Allergy, Asthma, and Immunology: www.acaai.org  Reducing Pollen Exposure  The American Academy of Allergy, Asthma and Immunology suggests the following steps to reduce your exposure to pollen during allergy seasons.    1. Do not hang sheets or clothing out to dry; pollen may collect on these items. 2. Do not mow lawns or spend time around freshly cut grass; mowing stirs up pollen. 3. Keep windows closed at night.  Keep car windows closed while driving. 4. Minimize morning activities outdoors, a time when pollen counts are usually at their highest. 5. Stay indoors as much as possible when pollen counts or humidity is high and on windy days when pollen tends to remain in the air longer. 6. Use air conditioning when possible.  Many air conditioners have filters that trap the pollen spores. 7. Use a HEPA room air filter to remove pollen form the indoor air you breathe.  Control of House Dust Mite Allergen    House dust mites play a major role in allergic asthma and rhinitis.  They occur in environments with high humidity wherever human skin, the food for dust mites is found. High levels have been detected in dust obtained from mattresses, pillows, carpets, upholstered furniture, bed covers, clothes and soft toys.  The principal allergen of the house dust mite is found in its feces.  A gram of dust may contain 1,000 mites and  250,000 fecal particles.  Mite antigen is easily measured in the air during house cleaning activities.    1. Encase mattresses, including the box spring, and pillow, in an air tight cover.  Seal the zipper end of the encased mattresses with wide adhesive tape. 2. Wash the bedding in water of 130 degrees Farenheit weekly.  Avoid cotton  comforters/quilts and flannel bedding: the most ideal bed covering is the dacron comforter. 3. Remove all upholstered furniture from the bedroom. 4. Remove carpets, carpet padding, rugs, and non-washable window drapes from the bedroom.  Wash drapes weekly or use plastic window coverings. 5. Remove all non-washable stuffed toys from the bedroom.  Wash stuffed toys weekly. 6. Have the room cleaned frequently with a vacuum cleaner and a damp dust-mop.  The patient should not be in a room which is being cleaned and should wait 1 hour after cleaning before going into the room. 7. Close and seal all heating outlets in the bedroom.  Otherwise, the room will become filled with dust-laden air.  An electric heater can be used to heat the room. 8. Reduce indoor humidity to less than 50%.  Do not use a humidifier.  Control of Dog or Cat Allergen  Avoidance is the best way to manage a dog or cat allergy. If you have a dog or cat and are allergic to dog or cats, consider removing the dog or cat from the home. If you have a dog or cat but don't want to find it a new home, or if your family wants a pet even though someone in the household is allergic, here are some strategies that may help keep symptoms at bay:  1. Keep the pet out of your bedroom and restrict it to only a few rooms. Be advised that keeping the dog or cat in only one room will not limit the allergens to that room. 2. Don't pet, hug or kiss the dog or cat; if you do, wash your hands with soap and water. 3. High-efficiency particulate air (HEPA) cleaners run continuously in a bedroom or living room can reduce allergen levels over time. 4. Regular use of a high-efficiency vacuum cleaner or a central vacuum can reduce allergen levels. 5. Giving your dog or cat a bath at least once a week can reduce airborne allergen.  Control of Cockroach Allergen  Cockroach allergen has been identified as an important cause of acute attacks of asthma, especially  in urban settings.  There are fifty-five species of cockroach that exist in the [place], however only three, the [place], [place] species produce allergen that can affect patients with Asthma.  Allergens can be obtained from fecal particles, egg casings and secretions from cockroaches.    1. Remove food sources. 2. Reduce access to water. 3. Seal access and entry points. 4. Spray runways with 0.5-1% Diazinon or Chlorpyrifos 5. Blow boric acid power under stoves and refrigerator. 6. Place bait stations (hydramethylnon) at feeding sites.

## 2016-08-06 DIAGNOSIS — L508 Other urticaria: Secondary | ICD-10-CM | POA: Insufficient documentation

## 2016-08-06 DIAGNOSIS — L2084 Intrinsic (allergic) eczema: Secondary | ICD-10-CM | POA: Insufficient documentation

## 2016-08-06 DIAGNOSIS — J3089 Other allergic rhinitis: Secondary | ICD-10-CM | POA: Insufficient documentation

## 2016-08-06 LAB — C-REACTIVE PROTEIN: CRP: 5.6 mg/L (ref <8.0)

## 2016-08-06 LAB — SEDIMENTATION RATE: SED RATE: 4 mm/h (ref 0–30)

## 2016-08-06 LAB — ANA: Anti Nuclear Antibody(ANA): NEGATIVE

## 2016-08-07 LAB — TRYPTASE: Tryptase: 5.9 ug/L (ref <11)

## 2016-08-12 ENCOUNTER — Other Ambulatory Visit: Payer: Self-pay | Admitting: *Deleted

## 2016-08-12 LAB — CP CHRONIC URTICARIA INDEX PANEL
Histamine Release: <16 % (ref <16)
THYROID PEROXIDASE ANTIBODY: 6 [IU]/mL (ref <9)
TSH: 3.08 m[IU]/L
Thyroglobulin Ab: 2 IU/mL — ABNORMAL HIGH (ref <2)

## 2016-08-12 MED ORDER — DUPILUMAB 300 MG/2ML ~~LOC~~ SOSY: 600.0000 mg | PREFILLED_SYRINGE | Freq: Once | SUBCUTANEOUS | 0 refills | Status: DC

## 2016-08-12 MED ORDER — DUPILUMAB 300 MG/2ML ~~LOC~~ SOSY: 600.0000 mg | PREFILLED_SYRINGE | Freq: Once | SUBCUTANEOUS | 0 refills | Status: AC

## 2016-08-12 MED ORDER — DUPILUMAB 300 MG/2ML ~~LOC~~ SOSY: 300.0000 mg | PREFILLED_SYRINGE | SUBCUTANEOUS | 10 refills | Status: DC

## 2016-08-12 MED FILL — DUPIXENT 300 MG/2 ML SAFE S: 300 | 28 days supply | Qty: 4 | Fill #0

## 2016-08-12 NOTE — Telephone Encounter (Signed)
Per Richardson Landry at [facility] just send Angela Casey, will fax PA approval Also contacted patient to advise that RX sent to [facility] and make sure she has copay card when she goes to pick up same.  Also instructed her to contact [facility] office to start therapy once she picks up RX

## 2016-08-12 NOTE — Telephone Encounter (Signed)
Loading dose order printed resent normal to [facility]

## 2016-08-13 MED FILL — SIMVASTATIN 20 MG TABLET: 20 | 90 days supply | Qty: 90 | Fill #1

## 2016-08-13 MED FILL — LOSARTAN POTASSIUM 50 MG TA: 50 | 90 days supply | Qty: 90 | Fill #1

## 2016-08-14 ENCOUNTER — Telehealth: Payer: Self-pay | Admitting: Allergy & Immunology

## 2016-08-14 NOTE — Telephone Encounter (Signed)
Please advise if you can give patient a coupon or if there is something else

## 2016-08-14 NOTE — Telephone Encounter (Signed)
Patient called me and I advised her how to go online to get copay card

## 2016-08-14 NOTE — Telephone Encounter (Signed)
patient is calling wanting a coupon for [place] - she wants to pick it up to take the pharmacy due to the cost of the medication The medication is already at the pharmacy and ready for pick up Please call pt to let her know when coupon is ready for pick up

## 2016-08-25 ENCOUNTER — Ambulatory Visit: Payer: 59 | Admitting: Internal Medicine

## 2016-08-26 MED FILL — FINACEA 15% FOAM: 15 | 30 days supply | Qty: 50 | Fill #0

## 2016-08-27 ENCOUNTER — Ambulatory Visit (INDEPENDENT_AMBULATORY_CARE_PROVIDER_SITE_OTHER): Payer: 59 | Admitting: *Deleted

## 2016-08-27 DIAGNOSIS — L209 Atopic dermatitis, unspecified: Secondary | ICD-10-CM | POA: Diagnosis not present

## 2016-08-27 NOTE — Progress Notes (Deleted)
Immunotherapy   Patient Details  Name: Angela Casey MRN: [id number] Date of Birth: [birth year]  08/27/2016  Creig Hines started injections for  Dupixent. Patient shown how to give injections and then gave herself the second injection. Patient will continue 300 mg every 14 days. EpiPen available.  Consent signed and patient instructions given.   Constance Holster 08/27/2016, 6:46 PM

## 2016-09-09 ENCOUNTER — Ambulatory Visit: Payer: 59 | Admitting: Allergy & Immunology

## 2016-09-23 ENCOUNTER — Ambulatory Visit (INDEPENDENT_AMBULATORY_CARE_PROVIDER_SITE_OTHER): Payer: 59

## 2016-09-23 ENCOUNTER — Ambulatory Visit (INDEPENDENT_AMBULATORY_CARE_PROVIDER_SITE_OTHER): Payer: 59 | Admitting: Podiatry

## 2016-09-23 DIAGNOSIS — M79671 Pain in right foot: Secondary | ICD-10-CM

## 2016-09-23 DIAGNOSIS — M7751 Other enthesopathy of right foot: Secondary | ICD-10-CM | POA: Diagnosis not present

## 2016-09-23 DIAGNOSIS — E1165 Type 2 diabetes mellitus with hyperglycemia: Secondary | ICD-10-CM

## 2016-09-23 DIAGNOSIS — M2011 Hallux valgus (acquired), right foot: Secondary | ICD-10-CM

## 2016-09-23 DIAGNOSIS — M779 Enthesopathy, unspecified: Secondary | ICD-10-CM

## 2016-09-23 DIAGNOSIS — M21611 Bunion of right foot: Secondary | ICD-10-CM

## 2016-09-23 DIAGNOSIS — E782 Mixed hyperlipidemia: Secondary | ICD-10-CM | POA: Diagnosis not present

## 2016-09-23 MED ORDER — MELOXICAM 15 MG PO TABS: 15.0000 mg | ORAL_TABLET | Freq: Every day | ORAL | 1 refills | Status: AC

## 2016-09-23 MED FILL — MELOXICAM 15 MG TABLET: 15 | 60 days supply | Qty: 60 | Fill #0

## 2016-09-24 NOTE — Progress Notes (Signed)
   Subjective: Patient presents today with a new complaint of cramping right dorsal and plantar forefoot pain that began approximately one month ago. She states the pain has worsened in the past week. She reports associated second toe pain of the right foot. Walking exacerbates the pain. She denies any known trauma or injury.    Objective: Physical Exam General: The patient is alert and oriented x3 in no acute distress.  Dermatology: Skin is cool, dry and supple bilateral lower extremities. Negative for open lesions or macerations.  Vascular: Palpable pedal pulses bilaterally. No edema or erythema noted. Capillary refill within normal limits.  Neurological: Epicritic and protective threshold grossly intact bilaterally.   Musculoskeletal Exam: Clinical evidence of bunion deformity noted to the respective foot. There is a moderate pain on palpation range of motion of the first MPJ. Lateral deviation of the hallux noted consistent with hallux abductovalgus. Pain with palpation to the 2nd MPJ of the right foot.  Radiographic Exam: Increased intermetatarsal angle greater than 15 with a hallux abductus angle greater than 30 noted on AP view. Moderate degenerative changes noted within the first MPJ.  Assessment: #1 2nd MPJ capsulitis-right #2 HAV-right   Plan of Care:  #1 Patient was evaluated. #2 Injection of 0.5 mLs Celestone Soluspan to the right 2nd MPJ #3 felt metatarsal pads dispensed #4 prescription for Meloxicam 15 mg #5 return to clinic in 4 weeks   Edrick Kins, DPM [facility]  Dr. Edrick Kins, [address]                                        [place], [place] [address]                Office [phone]  Fax [phone]

## 2016-09-25 ENCOUNTER — Telehealth: Payer: 59 | Admitting: Family

## 2016-09-25 DIAGNOSIS — B9689 Other specified bacterial agents as the cause of diseases classified elsewhere: Secondary | ICD-10-CM

## 2016-09-25 DIAGNOSIS — J028 Acute pharyngitis due to other specified organisms: Secondary | ICD-10-CM

## 2016-09-25 MED ORDER — ALBUTEROL SULFATE HFA 108 (90 BASE) MCG/ACT IN AERS: 2.0000 | INHALATION_SPRAY | Freq: Four times a day (QID) | RESPIRATORY_TRACT | 2 refills | Status: DC | PRN

## 2016-09-25 MED ORDER — BENZONATATE 100 MG PO CAPS: 100.0000 mg | ORAL_CAPSULE | Freq: Three times a day (TID) | ORAL | 0 refills | Status: DC | PRN

## 2016-09-25 MED ORDER — PREDNISONE 5 MG PO TABS: 5.0000 mg | ORAL_TABLET | ORAL | 0 refills | Status: DC

## 2016-09-25 MED ORDER — AZITHROMYCIN 250 MG PO TABS: ORAL_TABLET | ORAL | 0 refills | Status: DC

## 2016-09-25 MED FILL — predniSONE 5 MG TABS: 5 | 6 days supply | Qty: 21 | Fill #0

## 2016-09-25 MED FILL — VENTOLIN HFA 90 MCG INHALER: 108 (90 BAS | 25 days supply | Qty: 18 | Fill #0

## 2016-09-25 MED FILL — AZITHROMYCIN 250 MG TABLET: 250 | 5 days supply | Qty: 6 | Fill #0

## 2016-09-25 MED FILL — BENZONATATE 100 MG CAP: 100 | 5 days supply | Qty: 30 | Fill #0

## 2016-09-25 NOTE — Progress Notes (Signed)
We are sorry that you are not feeling well.  Here is how we plan to help!  Based on what you have shared with me it looks like you have upper respiratory tract inflammation that has resulted in a significant cough.  Inflammation and infection in the upper respiratory tract is commonly called bronchitis and has four common causes:  Allergies, Viral Infections, Acid Reflux and Bacterial Infections.  Allergies, viruses and acid reflux are treated by controlling symptoms or eliminating the cause. An example might be a cough caused by taking certain blood pressure medications. You stop the cough by changing the medication. Another example might be a cough caused by acid reflux. Controlling the reflux helps control the cough.  Based on your presentation I believe you most likely have A cough due to bacteria.  When patients have a fever and a productive cough with a change in color or increased sputum production, we are concerned about bacterial bronchitis.  If left untreated it can progress to pneumonia.  If your symptoms do not improve with your treatment plan it is important that you contact your provider.   I have prescribed Azithromyin 250 mg: two tables now and then one tablet daily for 4 additonal days    In addition you may use A non-prescription cough medication called Mucinex DM: take 2 tablets every 12 hours. and A prescription cough medication called Tessalon Perles 100mg . You may take 1-2 capsules every 8 hours as needed for your cough.  Sterapred 5 mg dosepak   I have also refilled your albuterol inhaler  USE OF BRONCHODILATOR ("RESCUE") INHALERS: There is a risk from using your bronchodilator too frequently.  The risk is that over-reliance on a medication which only relaxes the muscles surrounding the breathing tubes can reduce the effectiveness of medications prescribed to reduce swelling and congestion of the tubes themselves.  Although you feel brief relief from the bronchodilator inhaler,  your asthma may actually be worsening with the tubes becoming more swollen and filled with mucus.  This can delay other crucial treatments, such as oral steroid medications. If you need to use a bronchodilator inhaler daily, several times per day, you should discuss this with your provider.  There are probably better treatments that could be used to keep your asthma under control.     HOME CARE . Only take medications as instructed by your medical team. . Complete the entire course of an antibiotic. . Drink plenty of fluids and get plenty of rest. . Avoid close contacts especially the very young and the elderly . Cover your mouth if you cough or cough into your sleeve. . Always remember to wash your hands . A steam or ultrasonic humidifier can help congestion.   GET HELP RIGHT AWAY IF: . You develop worsening fever. . You become short of breath . You cough up blood. . Your symptoms persist after you have completed your treatment plan MAKE SURE YOU   Understand these instructions.  Will watch your condition.  Will get help right away if you are not doing well or get worse.  Your e-visit answers were reviewed by a board certified advanced clinical practitioner to complete your personal care plan.  Depending on the condition, your plan could have included both over the counter or prescription medications. If there is a problem please reply  once you have received a response from your provider. Your safety is important to [place].  If you have drug allergies check your prescription carefully.  You can use MyChart to ask questions about today's visit, request a non-urgent call back, or ask for a work or school excuse for 24 hours related to this e-Visit. If it has been greater than 24 hours you will need to follow up with your provider, or enter a new e-Visit to address those concerns. You will get an e-mail in the next two days asking about your experience.  I hope that your e-visit has been  valuable and will speed your recovery. Thank you for using e-visits.

## 2016-10-04 MED ORDER — BETAMETHASONE SOD PHOS & ACET 6 (3-3) MG/ML IJ SUSP: 3.0000 mg | Freq: Once | INTRAMUSCULAR | Status: DC

## 2016-10-06 ENCOUNTER — Ambulatory Visit (INDEPENDENT_AMBULATORY_CARE_PROVIDER_SITE_OTHER): Payer: 59 | Admitting: Internal Medicine

## 2016-10-06 ENCOUNTER — Encounter: Payer: Self-pay | Admitting: Internal Medicine

## 2016-10-06 VITALS — BP 118/80 | HR 90 | Ht 63.5 in | Wt 220.0 lb

## 2016-10-06 DIAGNOSIS — E782 Mixed hyperlipidemia: Secondary | ICD-10-CM

## 2016-10-06 DIAGNOSIS — E1165 Type 2 diabetes mellitus with hyperglycemia: Secondary | ICD-10-CM

## 2016-10-06 LAB — LIPID PANEL
CHOL/HDL RATIO: 4
Cholesterol: 135 mg/dL (ref 0–200)
HDL: 36.4 mg/dL — ABNORMAL LOW (ref >39.00)
LDL CALC: 67 mg/dL (ref 0–99)
NONHDL: 98.8
Triglycerides: 160 mg/dL — ABNORMAL HIGH (ref 0.0–149.0)
VLDL: 32 mg/dL (ref 0.0–40.0)

## 2016-10-06 LAB — POCT GLYCOSYLATED HEMOGLOBIN (HGB A1C): Hemoglobin A1C: 8

## 2016-10-06 MED ORDER — METFORMIN HCL ER 500 MG PO TB24: 1000.0000 mg | ORAL_TABLET | Freq: Two times a day (BID) | ORAL | 3 refills | Status: DC

## 2016-10-06 NOTE — Progress Notes (Signed)
Patient ID: Angela Casey, female   DOB: February 04, 1961, 56 y.o.   MRN: [id number]  HPI: Angela Casey is a 56 y.o.-year-old female, initially referred by her PCP, Dr. Wilhemena Durie, for management of DM2, dx 2010, non-insulin-dependent, uncontrolled, without long term complications. Last visit 5 mo ago.  She lost 16 lbs since last visit.  She had bronchitis for last 2 weeks >> was on Zithromax, Prednisone, Albuterol, etc. She had another Prednisone taper for hives.  Last hemoglobin A1c was: Lab Results  Component Value Date   HGBA1C 9.2% 05/27/2016   HGBA1C 8.4 02/13/2016   HGBA1C 7.4 07/23/2015   HGBA1C 8.5 04/24/2015   HGBA1C 8.9 (H) 08/01/2014   HGBA1C 9.7 (H) 03/08/2014   HGBA1C 9.1 (H) 12/05/2013   HGBA1C 11.1 (H) 06/29/2013  - 09/25/2013: HbA1c 8.3% - 12/27/2012: HbA1c 9.2% - previously: HbA1c 8.4%  Pt is on a regimen of: - Metformin (Glucophage XR) 2000 mg at dinnertime - Invokana 300 mg - added 10/2013 - Glipizide 5 mg in am and 10 mg before dinner She tried Januvia - started 07/2014 >> constipation (Mg did not help) >> try to stop and restart >> sxs restarted >> had same sxs >> stopped 05/2015 She stopped Victoza 1.8 mg daily >> developed N/D/AP  Pt checks her sugars 2-3x a day: - am: 134-209 >> 81, 110-155, 171 >> 107-163, 179, 206 >> 140-170 >> 118-152 - 2h after b'fast: n/c >> n/c >> 146-191 >> 131 >> 101-151 >> n/c - before lunch: 75, 124-139, 206 >> 80, 113-135, 141  >> 70-118 >> 120s >> 71, 88, 90, 237 - 2h after lunch: n/c >> 146 >> 102, 228 >> n/c - before dinner: 115-125 >> 125-193, 240 >> 84-200 >> n/c >> 130-150 >> 84, 157 - 2h after dinner: n/c >> 91-196 >> n/c - bedtime: up tp 160, 170, 198 x1 (after eating out) >> 250 >> n/c >> 170- 200 >> 109, 176, 274, 316 - nighttime: n/c No lows. Lowest sugar was 70 >> 120 >> 70; she has hypoglycemia awareness at 80.  Highest sugar was 206 >> 200 >> 316.  [facility] was taking nutrition classes [facility].  - no CKD, last BUN/creatinine:  Lab Results  Component Value Date   BUN 23 (H) 06/26/2015   Lab Results  Component Value Date   CREATININE 0.72 06/26/2015  On Losartan. She had a rash on Livalo. - Lipid panel: Lab Results  Component Value Date   CHOL 115 04/24/2015   HDL 31.70 (L) 04/24/2015   LDLCALC 55 04/24/2015   TRIG 146.0 04/24/2015   CHOLHDL 4 04/24/2015  On Simvastatin. - last eye exam was in 04/2016. No DR.  - no numbness and tingling in her feet.   I reviewed pt's medications, allergies, PMH, social hx, family hx, and changes were documented in the history of present illness. Otherwise, unchanged from my initial visit note.  ROS: Constitutional: + weight loss, no fatigue, no subjective hyperthermia, no subjective hypothermia Eyes: no blurry vision, no xerophthalmia ENT: no sore throat, no nodules palpated in throat, no dysphagia, no odynophagia, no hoarseness Cardiovascular: no CP/no SOB/no palpitations/no leg swelling Respiratory: + cough/no SOB/+ wheezing Gastrointestinal: no N/no V/no D/no C/no acid reflux Musculoskeletal: no muscle aches/no joint aches Skin: no rashes, no hair loss Neurological: no tremors/no numbness/no tingling/no dizziness  PE: BP 118/80 (BP Location: Left Arm, Patient Position: Sitting)   Pulse 90   Ht 5' 3.5" (1.613 m)  Wt 220 lb (99.8 kg)   SpO2 97%   BMI 38.36 kg/m  Body mass index is 38.36 kg/m.  Wt Readings from Last 3 Encounters:  10/06/16 220 lb (99.8 kg)  08/05/16 230 lb 9.6 oz (104.6 kg)  05/27/16 236 lb 3.2 oz (107.1 kg)   Constitutional: overweight, in NAD Eyes: PERRLA, EOMI, no exophthalmos ENT: moist mucous membranes, no thyromegaly, no cervical lymphadenopathy Cardiovascular: RRR, No MRG Respiratory: CTA B Gastrointestinal: abdomen soft, NT, ND, BS+ Musculoskeletal: no deformities, strength intact in all 4 Skin: moist, warm, no rashes Neurological: no tremor with outstretched hands, DTR normal in all  4  ASSESSMENT: 1. DM2, non-insulin-dependent, uncontrolled, with hyperglycemia  - In 05/2016 I suggested a weekly GLP1R agonist but she wouls like to try diet and exercise first  2. HL  PLAN:  1. Patient with poorly controlled DM, now improving after starting cutting down portions and improving the quality of her foods. Sugars were better before last 2 weeks when she developed bronchitis. Now getting better. She is also in [employer] through work. We may need to reduce Glipizide at next visit. No need for a GLP1 R agonist for now. - I suggested: Patient Instructions  Please continue: Metformin XR 2000 mg at dinnertime Invokana 300 mg daily in am   Glipizide 5 mg before b'fast and 10 mg before dinner  Please stop at the lab.  Please return in 3 months with your sugar log.  - no SEs from Invokana - continue checking sugars at different times of the day - check 2x times a day, rotating check times >> she is doing a good job with this per review of log - advised for yearly eye exams - she is UTD - check HbA1c today >> 8.0% (decreased!) - Return to clinic in 3 mo with sugar log    2. HL - now on Simvastatin - she is fasting >> check Lipids today  Component     Latest Ref Rng & Units 10/06/2016  Sodium     135 - 146 mmol/L 140  Potassium     3.5 - 5.3 mmol/L 4.5  Chloride     98 - 110 mmol/L 105  CO2     20 - 31 mmol/L 22  Glucose     65 - 99 mg/dL 131 (H)  BUN     7 - 25 mg/dL 19  Creatinine     0.50 - 1.05 mg/dL 0.72  Total Bilirubin     0.2 - 1.2 mg/dL 0.3  Alkaline Phosphatase     33 - 130 U/L 71  AST     10 - 35 U/L 22  ALT     6 - 29 U/L 32 (H)  Total Protein     6.1 - 8.1 g/dL 6.9  Albumin     3.6 - 5.1 g/dL 4.1  Calcium     8.6 - 10.4 mg/dL 9.0  GFR, Est African American     >=60 mL/min >89  GFR, Est Non African American     >=60 mL/min >89  Cholesterol     0 - 200 mg/dL 135  Triglycerides     0.0 - 149.0 mg/dL 160.0 (H)  HDL Cholesterol      >39.00 mg/dL 36.40 (L)  VLDL     0.0 - 40.0 mg/dL 32.0  LDL (calc)     0 - 99 mg/dL 67  Total CHOL/HDL Ratio      4  NonHDL  98.80  Hemoglobin A1C      8.0   ALT slightly high. Tg also slightly high.   Philemon Kingdom, MD PhD [facility] Endocrinology

## 2016-10-06 NOTE — Patient Instructions (Addendum)
Please continue: Metformin XR 2000 mg at dinnertime Invokana 300 mg daily in am   Glipizide 5 mg before b'fast and 10 mg before dinner  Please stop at the lab.  Please return in 3 months with your sugar log.

## 2016-10-07 LAB — COMPLETE METABOLIC PANEL WITH GFR
ALBUMIN: 4.1 g/dL (ref 3.6–5.1)
ALT: 32 U/L — AB (ref 6–29)
AST: 22 U/L (ref 10–35)
Alkaline Phosphatase: 71 U/L (ref 33–130)
BILIRUBIN TOTAL: 0.3 mg/dL (ref 0.2–1.2)
BUN: 19 mg/dL (ref 7–25)
CHLORIDE: 105 mmol/L (ref 98–110)
CO2: 22 mmol/L (ref 20–31)
Calcium: 9 mg/dL (ref 8.6–10.4)
Creat: 0.72 mg/dL (ref 0.50–1.05)
GFR, Est African American: >89 mL/min (ref >=60)
GLUCOSE: 131 mg/dL — AB (ref 65–99)
POTASSIUM: 4.5 mmol/L (ref 3.5–5.3)
SODIUM: 140 mmol/L (ref 135–146)
TOTAL PROTEIN: 6.9 g/dL (ref 6.1–8.1)

## 2016-10-16 MED FILL — METFORMIN HCL ER 500 MG TAB: 500 | 90 days supply | Qty: 360 | Fill #0

## 2016-10-20 DIAGNOSIS — K589 Irritable bowel syndrome without diarrhea: Secondary | ICD-10-CM | POA: Diagnosis not present

## 2016-10-20 DIAGNOSIS — R1013 Epigastric pain: Secondary | ICD-10-CM | POA: Diagnosis not present

## 2016-10-20 DIAGNOSIS — D126 Benign neoplasm of colon, unspecified: Secondary | ICD-10-CM | POA: Diagnosis not present

## 2016-10-20 MED FILL — DICYCLOMINE 10 MG CAPSULE: 10 | 30 days supply | Qty: 120 | Fill #0

## 2016-10-20 MED FILL — OMEPRAZOLE DR 40 MG CAPSULE: 40 | 90 days supply | Qty: 90 | Fill #0

## 2016-10-23 ENCOUNTER — Other Ambulatory Visit: Payer: Self-pay | Admitting: Internal Medicine

## 2016-10-23 MED FILL — INVOKANA 300 MG TABLET: 300 | 90 days supply | Qty: 90 | Fill #0

## 2016-10-27 ENCOUNTER — Other Ambulatory Visit: Payer: Self-pay

## 2016-10-27 MED ORDER — ACCU-CHEK GUIDE W/DEVICE KIT: 1.0000 | PACK | Freq: Every day | 0 refills | Status: DC

## 2016-10-27 MED ORDER — GLUCOSE BLOOD VI STRP: ORAL_STRIP | 5 refills | Status: DC

## 2016-10-27 MED ORDER — ACCU-CHEK FASTCLIX LANCETS MISC: 5 refills | Status: DC

## 2016-10-27 MED FILL — ACCU-CHEK GUIDE TEST STRIP: 90 days supply | Qty: 300 | Fill #0

## 2016-10-27 MED FILL — ACCU-CHEK FASTCLIX LANCETS: 90 days supply | Qty: 306 | Fill #0

## 2016-10-28 ENCOUNTER — Ambulatory Visit: Payer: 59 | Admitting: Podiatry

## 2016-11-04 ENCOUNTER — Ambulatory Visit: Payer: 59 | Admitting: Allergy & Immunology

## 2016-11-25 ENCOUNTER — Ambulatory Visit: Payer: 59 | Admitting: Podiatry

## 2016-12-07 ENCOUNTER — Ambulatory Visit (INDEPENDENT_AMBULATORY_CARE_PROVIDER_SITE_OTHER): Payer: 59 | Admitting: Podiatry

## 2016-12-07 ENCOUNTER — Encounter: Payer: Self-pay | Admitting: Podiatry

## 2016-12-07 DIAGNOSIS — M7751 Other enthesopathy of right foot: Secondary | ICD-10-CM | POA: Diagnosis not present

## 2016-12-07 MED ORDER — METHYLPREDNISOLONE 4 MG PO TBPK: ORAL_TABLET | ORAL | 0 refills | Status: DC

## 2016-12-07 MED ORDER — MELOXICAM 15 MG PO TABS: 15.0000 mg | ORAL_TABLET | Freq: Every day | ORAL | 1 refills | Status: AC

## 2016-12-07 MED FILL — MELOXICAM 15 MG TABLET: 15 | 30 days supply | Qty: 60 | Fill #0

## 2016-12-07 MED FILL — METHYLPREDNISOLONE 4 MG TAB: 4 | 6 days supply | Qty: 21 | Fill #0

## 2016-12-09 NOTE — Progress Notes (Signed)
   Subjective: Patient presents today for follow up evaluation of right forefoot pain. She states her pain is the same. The injection and taking Meloxicam has helped to improve the pain. She states she needs a refill on her medication because she lost it. She denies any new complaints at this time.    Objective: Physical Exam General: The patient is alert and oriented x3 in no acute distress.  Dermatology: Skin is cool, dry and supple bilateral lower extremities. Negative for open lesions or macerations.  Vascular: Palpable pedal pulses bilaterally. No edema or erythema noted. Capillary refill within normal limits.  Neurological: Epicritic and protective threshold grossly intact bilaterally.   Musculoskeletal Exam: Pain with palpation to the 2nd MPJ of the right foot. Range of motion within normal limits to all pedal and ankle joints bilateral. Muscle strength 5/5 in all groups bilateral.  Assessment: #1 2nd MPJ capsulitis-right   Plan of Care:  #1 Patient was evaluated. #2 Injection of 0.5 mLs Celestone Soluspan to the right 2nd MPJ #3 Post op shoe dispensed.  #4 Prescription for Medrol dose pak given to patient. #5 Prescription for Meloxicam 15 mg given to patient #6 Return to clinic in 4 weeks.   Edrick Kins, DPM [facility]  Dr. Edrick Kins, [address]                                        [place], [place] [address]                Office [phone]  Fax [phone]

## 2016-12-12 MED ORDER — BETAMETHASONE SOD PHOS & ACET 6 (3-3) MG/ML IJ SUSP: 3.0000 mg | Freq: Once | INTRAMUSCULAR | Status: DC

## 2017-01-04 ENCOUNTER — Ambulatory Visit: Payer: 59 | Admitting: Podiatry

## 2017-01-07 ENCOUNTER — Telehealth: Payer: Self-pay | Admitting: *Deleted

## 2017-01-07 ENCOUNTER — Other Ambulatory Visit: Payer: Self-pay | Admitting: Internal Medicine

## 2017-01-07 MED FILL — glipiZIDE 5 MG TABS: 5 | 90 days supply | Qty: 270 | Fill #1

## 2017-01-07 MED FILL — DUPIXENT 300 MG/2 ML SAFE S: 300 | 28 days supply | Qty: 4 | Fill #1

## 2017-01-07 MED FILL — LOSARTAN POTASSIUM 50 MG TA: 50 | 90 days supply | Qty: 90 | Fill #0

## 2017-01-07 MED FILL — METFORMIN HCL ER 500 MG TAB: 500 | 90 days supply | Qty: 360 | Fill #1

## 2017-01-07 NOTE — Telephone Encounter (Signed)
L/M for patient advising her that Ins approval is coming up in Sept and needs to make MD appt so I can send clinic note with approval before PA expiration.

## 2017-01-20 ENCOUNTER — Ambulatory Visit (INDEPENDENT_AMBULATORY_CARE_PROVIDER_SITE_OTHER): Payer: 59 | Admitting: Podiatry

## 2017-01-20 ENCOUNTER — Encounter: Payer: Self-pay | Admitting: Podiatry

## 2017-01-20 DIAGNOSIS — M7751 Other enthesopathy of right foot: Secondary | ICD-10-CM | POA: Diagnosis not present

## 2017-01-20 DIAGNOSIS — G5762 Lesion of plantar nerve, left lower limb: Secondary | ICD-10-CM | POA: Diagnosis not present

## 2017-01-20 NOTE — Progress Notes (Signed)
   HPI: Patient presents today for follow-up treatment and evaluation regarding second MPJ capsulitis to the right foot. Patient states she's doing much better. Pain is 1 out of 10. She is improved significantly since last visit. She believes the Medrol Dosepak meloxicam and postoperative shoe all helped. Patient also states that she has a history of Morton's neuroma left foot however she's doing very well. Patient is noted plates at this time   Physical Exam: General: The patient is alert and oriented x3 in no acute distress.  Dermatology: Skin is warm, dry and supple bilateral lower extremities. Negative for open lesions or macerations.  Vascular: Palpable pedal pulses bilaterally. No edema or erythema noted. Capillary refill within normal limits.  Neurological: Epicritic and protective threshold grossly intact bilaterally.   Musculoskeletal Exam: Range of motion within normal limits to all pedal and ankle joints bilateral. Muscle strength 5/5 in all groups bilateral.   Radiographic Exam:  Normal osseous mineralization. Joint spaces preserved. No fracture/dislocation/boney destruction.    Assessment: 1. Second MPJ capsulitis right foot-resolved 2. Morton's neuroma left foot-resolved   Plan of Care:  1. Patient was evaluated. 2. Recommend the patient continue wearing good supportive shoe gear that is wide and stiffness in the toe box. 3. Continue meloxicam when necessary 4. Return to clinic when necessary   Edrick Kins, DPM [facility]  Dr. Edrick Kins, DPM    [address], [place] [address]                Office [phone]  Fax [phone]

## 2017-01-22 ENCOUNTER — Ambulatory Visit: Payer: 59 | Admitting: Internal Medicine

## 2017-01-27 ENCOUNTER — Ambulatory Visit (INDEPENDENT_AMBULATORY_CARE_PROVIDER_SITE_OTHER): Payer: 59 | Admitting: Allergy & Immunology

## 2017-01-27 ENCOUNTER — Encounter: Payer: Self-pay | Admitting: Allergy & Immunology

## 2017-01-27 VITALS — BP 124/74 | HR 99 | Temp 98.7°F | Resp 20

## 2017-01-27 DIAGNOSIS — L2084 Intrinsic (allergic) eczema: Secondary | ICD-10-CM | POA: Diagnosis not present

## 2017-01-27 DIAGNOSIS — J302 Other seasonal allergic rhinitis: Secondary | ICD-10-CM | POA: Diagnosis not present

## 2017-01-27 DIAGNOSIS — L508 Other urticaria: Secondary | ICD-10-CM | POA: Diagnosis not present

## 2017-01-27 DIAGNOSIS — J3089 Other allergic rhinitis: Secondary | ICD-10-CM | POA: Diagnosis not present

## 2017-01-27 NOTE — Patient Instructions (Addendum)
1. Chronic urticaria - with normal workup - Continue with antihistamines as needed to keep the hives at [place].   2. Perennial allergic rhinitis (grasses, weeds, trees, dust mite, cat, dog, horse, cockroach) - Continue with antihistamines as needed.  - Continue with Flonase 2 sprays per nostril once daily as needed.  3. Intrinsic atopic dermatitis - on Dupixent - Continue with Dupixent every two weeks. - We will plan to continue for one year before weaning off.   4. Return in about 1 year (around 01/27/2018).   Please inform us of any Emergency Department visits, hospitalizations, or changes in symptoms. Call us before going to the ED for breathing or allergy symptoms since we might be able to fit you in for a sick visit. Feel free to contact us anytime with any questions, problems, or concerns.  It was a pleasure to see you again today! Email me with questions or concerns!    Websites that have reliable patient information: 1. American Academy of Asthma, Allergy, and Immunology: www.aaaai.org 2. Food Allergy Research and Education (FARE): foodallergy.org 3. Mothers of Asthmatics: http://www.asthmacommunitynetwork.org 4. American College of Allergy, Asthma, and Immunology: www.acaai.org   Election Day is coming up on Tuesday, November 6th! Make your voice heard! Register to vote at vote.org!

## 2017-01-27 NOTE — Progress Notes (Signed)
FOLLOW UP  Date of Service/Encounter:  01/27/17   Assessment:   Chronic urticaria  Seasonal and perennial allergic rhinitis (grasses, weeds, trees, dust mite, cat, dog, horse, cockroach)  Intrinsic atopic dermatitis - markedly improved on Dupixent  Plan/Recommendations:   1. Chronic urticaria - with normal workup - Continue with antihistamines as needed to keep the hives at [place].   2. Perennial allergic rhinitis (grasses, weeds, trees, dust mite, cat, dog, horse, cockroach) - Continue with antihistamines as needed.  - Continue with Flonase 2 sprays per nostril once daily as needed.  3. Intrinsic atopic dermatitis - on Dupixent - Continue with Dupixent every two weeks. - We will plan to continue for one year before weaning off.  - We discussed the mechanism of Dupixent and I emphasized that there has not been a noted increase in lymphoma in the studies thus far with Dupixent. - She was also concerned with venous stasis, but I reassured her that venous stasis would like not have improved with Dupixent given the differing pathological processes.  - She also reports that her pulses are checked routinely by her endocrinologist, and she has never had a problem with clotting.  - She does report some swelling with increased physical activity as well as increased salt intake, but this is normal for a patient her age.  4. Return in about 1 year (around 01/27/2018).   Subjective:   Angela Casey is a 56 y.o. female presenting today for follow up of  Chief Complaint  Patient presents with  . Eczema    Angela Casey has a history of the following: Patient Active Problem List   Diagnosis Date Noted  . Chronic urticaria 08/06/2016  . Perennial allergic rhinitis 08/06/2016  . Intrinsic atopic dermatitis 08/06/2016  . Hyperlipidemia 02/13/2016  . RAD (reactive airway disease) 07/02/2013  . BMI 40.0-44.9, adult ([place]) 07/02/2013  . Type 2 diabetes mellitus, uncontrolled ([place])  06/29/2013    History obtained from: chart review and the patient.  Angela Casey [facility] Primary Care Provider is Cori Razor, MD.     Angela Casey is a 56 y.o. female presenting for a follow up visit. She was last seen in March 2018 for her first visit. At that time, she had a combination of both urticarial and atopic dermatitis lesions. Her urticaria were most concentrated on her arms and her atopic dermatitis was on her bilateral legs. We did do an extensive lab workup for her chronic urticaria which was largely normal. She did have a chronic urticaria panel that showed a borderline elevated anti-thyroglobulin antibody level. We recommended suppressive doses of antihistamines. For her eczema, we obtained approval for Dupixent, which she started with excellent results. We did do environmental allergy testing as well that showed positives to grasses, weeds, trees, dust mite, cat, dog, horse, and cockroach. She was only having symptoms in the spring, therefore we recommended using Flonase 2 sprays per nostril daily during the worst seasons.  Since the last visit, she has done very well. She started the [place] and has had no side effects, including conjunctivitis. Her eczema cleared up completely with the medication. However, she has not used it in several weeks with worsening of her eczema. She did not want to use it Because she wanted me to see her rash before she restarted it. It came back, but not the same extent as it was when I saw her in March. She is having severe pruritus is breaking the skin, but denies redness and  purulent discharge. She is not using any topical ointments for this.   It is not entirely clear why she stopped using Dupixent on a regular basis, but I did recommend that she restart it. She has read on the Internet that causes lymphoma, which is one of the reasons she stopped it. Allergic rhinitis is well controlled with Flonase as needed. Her worst time the uterus the spring. She is using  antihistamines, but not on a daily basis. Her urticaria have largely resolved. She does use a combination of various over-the-counter emollients for her skin, and she does have triamcinolone to this. She has never tried to [place].   She also reports today that she has had 2 tick bites. One was on her right hip and the other one was behind her left knee. Neither was engorged when she noticed them and she immediately took them off. The one on her right hip was on for less than 24 hours, she is unsure how long the one on her knee was embedded. She has had extreme pruritus around the tick bite behind her left knee, but there does not appear to be any rash or swelling around the area.  Otherwise, there have been no changes to her past medical history, surgical history, family history, or social history. She has had a good summer. 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 She is planning to go to the beach next week.    Review of Systems: a 14-point review of systems is pertinent for what is mentioned in HPI.  Otherwise, all other systems were negative. Constitutional: negative other than that listed in the HPI Eyes: negative other than that listed in the HPI Ears, nose, mouth, throat, and face: negative other than that listed in the HPI Respiratory: negative other than that listed in the HPI Cardiovascular: negative other than that listed in the HPI Gastrointestinal: negative other than that listed in the HPI Genitourinary: negative other than that listed in the HPI Integument: negative other than that listed in the HPI Hematologic: negative other than that listed in the HPI Musculoskeletal: negative other than that listed in the HPI Neurological: negative other than that listed in the HPI Allergy/Immunologic: negative other than that listed in the HPI    Objective:   Blood pressure 124/74, pulse 99, temperature 98.7 F (37.1 C), temperature  source Oral, resp. rate 20, SpO2 96 %. There is no height or weight on file to calculate BMI.   Physical Exam:  General: Alert, interactive, in no acute distress. Obese female. Very pleasant and amusing.  Eyes: No conjunctival injection present on the right, No conjunctival injection present on the left, PERRL bilaterally, No discharge on the right, No discharge on the left and No Horner-Trantas dots present Ears: Right TM pearly gray with normal light reflex, Left TM pearly gray with normal light reflex, Right TM intact without perforation and Left TM intact without perforation.  Nose/Throat: External nose within normal limits and septum midline, turbinates edematous with clear discharge, post-pharynx mildly erythematous without cobblestoning in the posterior oropharynx. Tonsils 2+ without exudates Neck: Supple without thyromegaly. Lungs: Clear to auscultation without wheezing, rhonchi or rales. No increased work of breathing. CV: Normal S1/S2, no murmurs. Capillary refill <2 seconds.  Skin: Dry, erythematous, excoriated patches on the bilateral shins with some broken skin and bleeding. Neuro:   Grossly intact. No focal deficits appreciated. Responsive to questions.   Diagnostic studies: none    Angela Casey  Angela Bowler, MD [facility]

## 2017-02-10 MED FILL — DUPIXENT 300 MG/2 ML SAFE S: 300 | 28 days supply | Qty: 4 | Fill #2

## 2017-02-11 MED FILL — SIMVASTATIN 20 MG TABLET: 20 | 90 days supply | Qty: 90 | Fill #2

## 2017-03-11 MED FILL — OMEPRAZOLE DR 40 MG CAPSULE: 40 | 90 days supply | Qty: 90 | Fill #1

## 2017-03-11 MED FILL — INVOKANA 300 MG TABLET: 300 | 90 days supply | Qty: 90 | Fill #1

## 2017-03-26 ENCOUNTER — Ambulatory Visit: Payer: 59 | Admitting: Internal Medicine

## 2017-04-07 ENCOUNTER — Other Ambulatory Visit: Payer: Self-pay | Admitting: Obstetrics & Gynecology

## 2017-04-07 ENCOUNTER — Other Ambulatory Visit: Payer: Self-pay | Admitting: Family Medicine

## 2017-04-07 DIAGNOSIS — Z1231 Encounter for screening mammogram for malignant neoplasm of breast: Secondary | ICD-10-CM

## 2017-04-23 DIAGNOSIS — H5213 Myopia, bilateral: Secondary | ICD-10-CM | POA: Diagnosis not present

## 2017-04-23 DIAGNOSIS — H524 Presbyopia: Secondary | ICD-10-CM | POA: Diagnosis not present

## 2017-04-23 DIAGNOSIS — E119 Type 2 diabetes mellitus without complications: Secondary | ICD-10-CM | POA: Diagnosis not present

## 2017-05-05 MED FILL — METFORMIN HCL ER 500 MG TAB: 500 | 90 days supply | Qty: 360 | Fill #2

## 2017-05-12 MED FILL — MELOXICAM 15 MG TABLET: 15 | 30 days supply | Qty: 60 | Fill #1

## 2017-05-24 ENCOUNTER — Other Ambulatory Visit: Payer: Self-pay

## 2017-05-24 ENCOUNTER — Ambulatory Visit
Admission: RE | Admit: 2017-05-24 | Discharge: 2017-05-24 | Disposition: A | Discharge status: Home / Self Care | Payer: 59 | Source: Ambulatory Visit | Attending: Obstetrics & Gynecology | Admitting: Obstetrics & Gynecology

## 2017-05-24 DIAGNOSIS — Z1231 Encounter for screening mammogram for malignant neoplasm of breast: Secondary | ICD-10-CM

## 2017-05-24 NOTE — Patient Outreach (Signed)
[facility]) Care Management  05/24/2017  LASHUNTA FRIEDEN Jun 30, 1960 [id number]   Case closed in [place].  Member is being followed by the [employer] program Member enrolled in an external program Peter Garter RN, [facility] Care Management Coordinator-Link to [place] Management [phone]

## 2017-05-26 DIAGNOSIS — Z6841 Body Mass Index (BMI) 40.0 and over, adult: Secondary | ICD-10-CM | POA: Diagnosis not present

## 2017-05-26 DIAGNOSIS — Z01419 Encounter for gynecological examination (general) (routine) without abnormal findings: Secondary | ICD-10-CM | POA: Diagnosis not present

## 2017-05-28 ENCOUNTER — Ambulatory Visit: Payer: 59 | Admitting: Internal Medicine

## 2017-06-30 ENCOUNTER — Other Ambulatory Visit: Payer: Self-pay | Admitting: Internal Medicine

## 2017-06-30 MED FILL — LOSARTAN POTASSIUM 50 MG TA: 50 | 90 days supply | Qty: 90 | Fill #1

## 2017-06-30 MED FILL — VENTOLIN HFA 90 MCG INHALER: 108 (90 BAS | 8 days supply | Qty: 18 | Fill #0

## 2017-06-30 MED FILL — ADVAIR 500/50 DISKUS: 500-50 | 30 days supply | Qty: 60 | Fill #0

## 2017-07-05 NOTE — Telephone Encounter (Signed)
Okay to send 

## 2017-07-05 NOTE — Telephone Encounter (Signed)
Is this okay to refill? 

## 2017-07-23 MED FILL — SIMVASTATIN 20 MG TABLET: 20 | 90 days supply | Qty: 90 | Fill #0

## 2017-07-23 MED FILL — METFORMIN HCL ER 500 MG TAB: 500 | 90 days supply | Qty: 360 | Fill #3

## 2017-08-05 ENCOUNTER — Encounter (HOSPITAL_COMMUNITY): Payer: Self-pay | Admitting: Family Medicine

## 2017-08-05 ENCOUNTER — Ambulatory Visit (HOSPITAL_COMMUNITY)
Admission: EM | Admit: 2017-08-05 | Discharge: 2017-08-05 | Disposition: A | Discharge status: Home / Self Care | Payer: 59 | Attending: Internal Medicine | Admitting: Internal Medicine

## 2017-08-05 NOTE — ED Triage Notes (Addendum)
Pt here for central sharp stabbing chest pain that started today about 1 hour after eating an english muffin. sts that the pain radiated into back in between shoulder blades. sts for the past week she has had fluttering in chest that has been intermittent. sts the pain has subsided. Denies any cardiac hx. sts that this also happened on Sunday after a long drive from the beach.

## 2017-08-05 NOTE — ED Notes (Signed)
Spoke with Lanelle Bal NP and based on the pt risk factors, hx and episode this am. Recommended further work up in the ER for cardiac enzymes. Sent to the ER. Pt stable upon triage,

## 2017-08-06 ENCOUNTER — Ambulatory Visit: Payer: 59 | Admitting: Internal Medicine

## 2017-08-06 ENCOUNTER — Ambulatory Visit (INDEPENDENT_AMBULATORY_CARE_PROVIDER_SITE_OTHER): Payer: 59 | Admitting: Internal Medicine

## 2017-08-06 ENCOUNTER — Encounter: Payer: Self-pay | Admitting: Internal Medicine

## 2017-08-06 VITALS — BP 122/80 | HR 90 | Ht 63.5 in | Wt 233.0 lb

## 2017-08-06 DIAGNOSIS — E1165 Type 2 diabetes mellitus with hyperglycemia: Secondary | ICD-10-CM

## 2017-08-06 DIAGNOSIS — E782 Mixed hyperlipidemia: Secondary | ICD-10-CM

## 2017-08-06 LAB — POCT GLYCOSYLATED HEMOGLOBIN (HGB A1C): HEMOGLOBIN A1C: 8.6

## 2017-08-06 MED ORDER — INSULIN GLARGINE 100 UNIT/ML SOLOSTAR PEN: 10.0000 [IU] | PEN_INJECTOR | Freq: Every day | SUBCUTANEOUS | 11 refills | Status: DC

## 2017-08-06 MED ORDER — INSULIN PEN NEEDLE 32G X 4 MM MISC: 3 refills | Status: DC

## 2017-08-06 MED FILL — UNIFINE PENTIPS 32GX5/32: 32G X 4 MM | 90 days supply | Qty: 100 | Fill #0

## 2017-08-06 MED FILL — UNIFINE PENTIPS 32GX5/32": 32G X 4 MM | 90 days supply | Qty: 100 | Fill #0

## 2017-08-06 MED FILL — LANTUS SOLOSTAR 100 UNITS/M: 100 | 84 days supply | Qty: 9 | Fill #0

## 2017-08-06 NOTE — Progress Notes (Signed)
Patient ID: Angela Casey, female   DOB: Apr 09, 1961, 57 y.o.   MRN: [id number]  HPI: Angela Casey is a 57 y.o.-year-old female, initially referred by her PCP, Dr. Wilhemena Durie, for management of DM2, dx 2010, previously non-insulin-dependent, uncontrolled, without long term complications. Last visit 10 months ago.  She developed A flutter since last visit.  Last hemoglobin A1c was: Lab Results  Component Value Date   HGBA1C 8.0 10/06/2016   HGBA1C 9.2% 05/27/2016   HGBA1C 8.4 02/13/2016   HGBA1C 7.4 07/23/2015   HGBA1C 8.5 04/24/2015   HGBA1C 8.9 (H) 08/01/2014   HGBA1C 9.7 (H) 03/08/2014   HGBA1C 9.1 (H) 12/05/2013   HGBA1C 11.1 (H) 06/29/2013  - 09/25/2013: HbA1c 8.3% - 12/27/2012: HbA1c 9.2% - previously: HbA1c 8.4%  Pt is on a regimen of: - Metformin (Glucophage) 2000 mg at dinnertime - Invokana 300 mg in a.m.- added 10/2013 - Glipizide 5 mg in am and 10 mg before dinner (forgets it 50% time) She tried Januvia - started 07/2014 >> constipation (Mg did not help) >> try to stop and restart >> sxs restarted >> had same sxs >> she stopped 05/2015. She stopped Victoza 1.8 mg daily >> developed N/C/AP  Pt checks her sugars 2-3 times a day: - am: 140-170 >> 118-152 >> 130-160s - 2h after b'fast:131 >> 101-151 >> 123 - before lunch: 120s >> 71, 88, 90, 237 >> 103 - 2h after lunch: 146 >> 102, 228 >> n/c - before dinner: 130-150 >> 84, 157 >> 157, 161, 209 - 2h after dinner: n/c >> 91-196 >> n/c - bedtime: 170- 200 >> 109, 176, 274, 316 >> 189 - nighttime: n/c Lowest sugar was 70 >> 103; she has hypoglycemia awareness in the 80s. Highest sugar was 316 >> 209  [facility] was taking nutrition classes [facility]  -No CKD, last BUN/creatinine:  Lab Results  Component Value Date   BUN 19 10/06/2016   Lab Results  Component Value Date   CREATININE 0.72 10/06/2016  On losartan. -+ HL; lipid panel: Lab Results  Component Value Date   CHOL 135 10/06/2016   HDL  36.40 (L) 10/06/2016   LDLCALC 67 10/06/2016   TRIG 160.0 (H) 10/06/2016   CHOLHDL 4 10/06/2016  On simvastatin.  Had a rash on Livalo. - last eye exam was in 05/2017 >> No DR -No numbness and tingling in her feet. + R foot pain. Got steroid inj's.  ROS: Constitutional: no weight gain/no weight loss, no fatigue, no subjective hyperthermia, no subjective hypothermia Eyes: no blurry vision, no xerophthalmia ENT: no sore throat, no nodules palpated in throat, no dysphagia, no odynophagia, no hoarseness Cardiovascular: no CP/no SOB/no palpitations/no leg swelling Respiratory: no cough/no SOB/no wheezing Gastrointestinal: no N/no V/no D/no C/no acid reflux Musculoskeletal: no muscle aches/no joint aches Skin: no rashes, no hair loss Neurological: no tremors/no numbness/no tingling/no dizziness  I reviewed pt's medications, allergies, PMH, social hx, family hx, and changes were documented in the history of present illness. Otherwise, unchanged from my initial visit note.  Past Medical History:  Diagnosis Date  . Allergy   . Arthritis    knees  . Asthma    asthmatic bronchitis  . Diabetes mellitus without complication ([place])   . GERD (gastroesophageal reflux disease)   . Hypercholesteremia    Past Surgical History:  Procedure Laterality Date  . ANTERIOR CRUCIATE LIGAMENT REPAIR Right   . BACK SURGERY     L5-S1  . CARPAL TUNNEL RELEASE    .  KNEE ARTHROSCOPY Left 06/28/2015   Procedure: Left Knee Arthroscopy and Debridement;  Surgeon: Meredith Pel, MD;  Location: [facility];  Service: Orthopedics;  Laterality: Left;  . KNEE ARTHROSCOPY WITH MEDIAL MENISECTOMY Left 06/28/2015   Procedure: KNEE ARTHROSCOPY WITH MEDIAL MENISECTOMY;  Surgeon: Meredith Pel, MD;  Location: [facility];  Service: Orthopedics;  Laterality: Left;  . KNEE SURGERY     Social History   Socioeconomic History  . Marital status: Divorced    Spouse name: Not on file  .  Number of children: Not on file  . Years of education: Not on file  . Highest education level: Not on file  Social Needs  . Financial resource strain: Not on file  . Food insecurity - worry: Not on file  . Food insecurity - inability: Not on file  . Transportation needs - medical: Not on file  . Transportation needs - non-medical: Not on file  Occupational History  . Not on file  Tobacco Use  . Smoking status: Never Smoker  . Smokeless tobacco: Never Used  Substance and Sexual Activity  . Alcohol use: Yes    Alcohol/week: 0.0 oz    Comment: social  . Drug use: No  . Sexual activity: No    Birth control/protection: Abstinence  Other Topics Concern  . Not on file  Social History Narrative  . Not on file   Current Outpatient Medications on File Prior to Visit  Medication Sig Dispense Refill  . ACCU-CHEK FASTCLIX LANCETS MISC Use to check sugar 3 times daily 300 each 5  . albuterol (PROVENTIL HFA;VENTOLIN HFA) 108 (90 Base) MCG/ACT inhaler Inhale 2 puffs into the lungs every 6 (six) hours as needed for wheezing or shortness of breath. 1 Inhaler 2  . Blood Glucose Monitoring Suppl (ACCU-CHEK GUIDE) w/Device KIT 1 Device by Does not apply route daily. 1 kit 0  . Dupilumab (DUPIXENT) 300 MG/2ML SOSY Inject 300 mg into the skin every 14 (fourteen) days. 4 mL 10  . glipiZIDE (GLUCOTROL) 5 MG tablet Take 1 tablet before b'fast and 2 tablets before dinner. 270 tablet 1  . glucose blood (ACCU-CHEK GUIDE) test strip Use as instructed to check sugar 3 times daily 300 each 5  . ibuprofen (ADVIL,MOTRIN) 600 MG tablet Take 600 mg by mouth every 6 (six) hours as needed.    . INVOKANA 300 MG TABS tablet TAKE 1 TABLET ONCE A DAY BY MOUTH 90 tablet 1  . loratadine (CLARITIN) 10 MG tablet Take by mouth.    . losartan (COZAAR) 50 MG tablet TAKE 1 TABLET BY MOUTH ONCE DAILY 90 tablet 1  . metFORMIN (GLUCOPHAGE XR) 500 MG 24 hr tablet Take 2 tablets (1,000 mg total) by mouth 2 (two) times daily. 360  tablet 3  . omeprazole (PRILOSEC) 40 MG capsule   11  . simvastatin (ZOCOR) 20 MG tablet TAKE 1 TABLET (20 MG TOTAL) BY MOUTH AT BEDTIME.     Current Facility-Administered Medications on File Prior to Visit  Medication Dose Route Frequency Provider Last Rate Last Dose  . albuterol (PROVENTIL) (2.5 MG/3ML) 0.083% nebulizer solution 2.5 mg  2.5 mg Nebulization Once Wendie Agreste, MD      . betamethasone acetate-betamethasone sodium phosphate (CELESTONE) injection 3 mg  3 mg Intramuscular Once Daylene Katayama M, DPM      . betamethasone acetate-betamethasone sodium phosphate (CELESTONE) injection 3 mg  3 mg Intramuscular Once Edrick Kins, DPM      .  betamethasone acetate-betamethasone sodium phosphate (CELESTONE) injection 3 mg  3 mg Intramuscular Once Daylene Katayama M, DPM      . ipratropium (ATROVENT) nebulizer solution 0.5 mg  0.5 mg Nebulization Once Wendie Agreste, MD       Allergies  Allergen Reactions  . Livalo [Pitavastatin] Itching, Rash and Hives  . Exenatide Nausea And Vomiting    diarrhea  . Pollen Extract     Other reaction(s): sneezing   Family History  Problem Relation Age of Onset  . Cancer Mother        uterine  . Diabetes Father   . COPD Father   . Asthma Father   . Diabetes Paternal Grandfather   . Breast cancer Paternal Grandmother        in 21's  . Allergic rhinitis Neg Hx   . Angioedema Neg Hx   . Atopy Neg Hx   . Eczema Neg Hx   . Immunodeficiency Neg Hx   . Urticaria Neg Hx     PE: BP 122/80   Pulse 90   Ht 5' 3.5" (1.613 m)   Wt 233 lb (105.7 kg)   SpO2 96%   BMI 40.63 kg/m  Body mass index is 40.63 kg/m.  Wt Readings from Last 3 Encounters:  08/06/17 233 lb (105.7 kg)  10/06/16 220 lb (99.8 kg)  08/05/16 230 lb 9.6 oz (104.6 kg)   Constitutional: overweight, in NAD Eyes: PERRLA, EOMI, no exophthalmos ENT: moist mucous membranes, no thyromegaly, no cervical lymphadenopathy Cardiovascular: RR, No MRG Respiratory: CTA  B Gastrointestinal: abdomen soft, NT, ND, BS+ Musculoskeletal: no deformities, strength intact in all 4 Skin: moist, warm, no rashes Neurological: no tremor with outstretched hands, DTR normal in all 4  ASSESSMENT: 1. DM2, non-insulin-dependent, uncontrolled, with hyperglycemia   - In 05/2016 I suggested a weekly GLP1R agonist but she wanted to try diet and exercise first  2. HL  PLAN:  1. Patient with poorly controlled diabetes,  improved at last visit after starting to cut down portions and improving the quality of her foods. However, since then, the control deteriorated again >> as of now, we need to escalate her regimen, but she cannot tolerate a GLP1 R agonist >> will need to start basal insulin >> start at 10 units and advance as needed. We also discussed about the absolute need to improve diet. - At last visit, HbA1c improved to 8.0- today, HbA1c is 8.6% (higher) - We can also use a GLP-1 receptor agonist if needed in the future - I suggested: Patient Instructions  Please continue: Metformin XR 2000 mg at bedtime e Invokana 300 mg daily in am   Glipizide 5 mg before b'fast and 10 mg before dinner  Please start: - Lantus 10 units at bedtime. Please increase by 2 units every 4 days until sugars in am <130 or until you get to 26 units.  Please return in 3 months with your sugar log.  - continue checking sugars at different times of the day - check 1x a day, rotating checks - advised for yearly eye exams >> she is UTD - Return to clinic in 3 mo with sugar log   2. HL -Reviewed lipid panel from 10/2016: LDL at goal, triglycerides slightly high, HDL low -Continues simvastatin without side effects   Philemon Kingdom, MD PhD [facility]

## 2017-08-06 NOTE — Addendum Note (Signed)
Addended by: Nile Riggs on: 08/06/2017 10:35 AM   Modules accepted: Orders

## 2017-08-06 NOTE — Patient Instructions (Signed)
Please continue: Metformin XR 2000 mg at bedtime e Invokana 300 mg daily in am   Glipizide 5 mg before b'fast and 10 mg before dinner  Please start: - Lantus 10 units at bedtime. Please increase by 2 units every 4 days until sugars in am <130 or until you get to 26 units.  Please return in 3 months with your sugar log.

## 2017-08-09 ENCOUNTER — Other Ambulatory Visit: Payer: Self-pay | Admitting: Internal Medicine

## 2017-08-09 MED FILL — OMEPRAZOLE DR 40 MG CAPSULE: 40 | 90 days supply | Qty: 90 | Fill #2

## 2017-08-09 MED FILL — INVOKANA 300 MG TABLET: 300 | 90 days supply | Qty: 90 | Fill #0

## 2017-09-16 ENCOUNTER — Other Ambulatory Visit: Payer: Self-pay | Admitting: Internal Medicine

## 2017-09-17 MED FILL — glipiZIDE 5 MG TABS: 5 | 90 days supply | Qty: 270 | Fill #0

## 2017-09-17 MED FILL — LOSARTAN POTASSIUM 50 MG TA: 50 | 90 days supply | Qty: 90 | Fill #0

## 2017-11-11 ENCOUNTER — Other Ambulatory Visit: Payer: Self-pay | Admitting: Internal Medicine

## 2017-11-11 MED FILL — METFORMIN HCL ER 500 MG TAB: 500 | 90 days supply | Qty: 360 | Fill #0

## 2017-11-11 MED FILL — LANTUS SOLOSTAR 100 UNITS/M: 100 | 84 days supply | Qty: 9 | Fill #1

## 2017-11-11 MED FILL — INVOKANA 300 MG TABLET: 300 | 90 days supply | Qty: 90 | Fill #1

## 2017-11-25 DIAGNOSIS — Z13220 Encounter for screening for lipoid disorders: Secondary | ICD-10-CM | POA: Diagnosis not present

## 2017-11-25 DIAGNOSIS — Z0000 Encounter for general adult medical examination without abnormal findings: Secondary | ICD-10-CM | POA: Diagnosis not present

## 2017-11-25 DIAGNOSIS — E1165 Type 2 diabetes mellitus with hyperglycemia: Secondary | ICD-10-CM | POA: Diagnosis not present

## 2017-11-25 DIAGNOSIS — Z6841 Body Mass Index (BMI) 40.0 and over, adult: Secondary | ICD-10-CM | POA: Diagnosis not present

## 2017-11-30 DIAGNOSIS — K582 Mixed irritable bowel syndrome: Secondary | ICD-10-CM | POA: Diagnosis not present

## 2017-11-30 DIAGNOSIS — R112 Nausea with vomiting, unspecified: Secondary | ICD-10-CM | POA: Diagnosis not present

## 2017-11-30 DIAGNOSIS — R140 Abdominal distension (gaseous): Secondary | ICD-10-CM | POA: Diagnosis not present

## 2017-11-30 DIAGNOSIS — R1013 Epigastric pain: Secondary | ICD-10-CM | POA: Diagnosis not present

## 2017-12-03 ENCOUNTER — Ambulatory Visit: Payer: 59 | Admitting: Internal Medicine

## 2017-12-03 ENCOUNTER — Other Ambulatory Visit (HOSPITAL_COMMUNITY): Payer: Self-pay | Admitting: Physician Assistant

## 2017-12-03 DIAGNOSIS — R112 Nausea with vomiting, unspecified: Secondary | ICD-10-CM

## 2017-12-03 DIAGNOSIS — R1013 Epigastric pain: Secondary | ICD-10-CM

## 2017-12-03 DIAGNOSIS — R140 Abdominal distension (gaseous): Secondary | ICD-10-CM

## 2017-12-03 DIAGNOSIS — K582 Mixed irritable bowel syndrome: Secondary | ICD-10-CM

## 2017-12-10 ENCOUNTER — Telehealth: Payer: 59 | Admitting: Family

## 2017-12-10 DIAGNOSIS — B9689 Other specified bacterial agents as the cause of diseases classified elsewhere: Secondary | ICD-10-CM | POA: Diagnosis not present

## 2017-12-10 DIAGNOSIS — J028 Acute pharyngitis due to other specified organisms: Secondary | ICD-10-CM

## 2017-12-10 DIAGNOSIS — J029 Acute pharyngitis, unspecified: Secondary | ICD-10-CM

## 2017-12-10 MED ORDER — PREDNISONE 5 MG PO TABS: 5.0000 mg | ORAL_TABLET | ORAL | 0 refills | Status: DC

## 2017-12-10 MED ORDER — BENZONATATE 100 MG PO CAPS: 100.0000 mg | ORAL_CAPSULE | Freq: Three times a day (TID) | ORAL | 0 refills | Status: DC | PRN

## 2017-12-10 MED ORDER — ALBUTEROL SULFATE 108 (90 BASE) MCG/ACT IN AEPB: 2.0000 | INHALATION_SPRAY | RESPIRATORY_TRACT | 2 refills | Status: DC | PRN

## 2017-12-10 MED FILL — BENZONATATE 100 MG CAPS: 100 | 5 days supply | Qty: 30 | Fill #0

## 2017-12-10 MED FILL — PROAIR RESPICLICK INHAL PWD: 108 (90 BAS | 16 days supply | Qty: 1 | Fill #0

## 2017-12-10 MED FILL — predniSONE 5 MG TABS: 5 | 6 days supply | Qty: 21 | Fill #0

## 2017-12-10 NOTE — Progress Notes (Signed)
Thank you for the details you included in the comment boxes. Those details are very helpful in determining the best course of treatment for you and help [place] to provide the best care. Watch your blood sugar carefully during the steroid pack. See plan below.   We are sorry that you are not feeling well.  Here is how we plan to help!  Based on your presentation I believe you most likely have A cough due to a virus.  This is called viral bronchitis and is best treated by rest, plenty of fluids and control of the cough.  You may use Ibuprofen or Tylenol as directed to help your symptoms.     In addition you may use A non-prescription cough medication called Mucinex DM: take 2 tablets every 12 hours. and A prescription cough medication called Tessalon Perles 100mg . You may take 1-2 capsules every 8 hours as needed for your cough.  Prednisone 5 mg daily for 6 days (see taper instructions below)  Directions for 6 day taper: Day 1: 2 tablets before breakfast, 1 after both lunch & dinner and 2 at bedtime Day 2: 1 tab before breakfast, 1 after both lunch & dinner and 2 at bedtime Day 3: 1 tab at each meal & 1 at bedtime Day 4: 1 tab at breakfast, 1 at lunch, 1 at bedtime Day 5: 1 tab at breakfast & 1 tab at bedtime Day 6: 1 tab at breakfast   From your responses in the eVisit questionnaire you describe inflammation in the upper respiratory tract which is causing a significant cough.  This is commonly called Bronchitis and has four common causes:    Allergies  Viral Infections  Acid Reflux  Bacterial Infection Allergies, viruses and acid reflux are treated by controlling symptoms or eliminating the cause. An example might be a cough caused by taking certain blood pressure medications. You stop the cough by changing the medication. Another example might be a cough caused by acid reflux. Controlling the reflux helps control the cough.  USE OF BRONCHODILATOR ("RESCUE") INHALERS: There is a risk from  using your bronchodilator too frequently.  The risk is that over-reliance on a medication which only relaxes the muscles surrounding the breathing tubes can reduce the effectiveness of medications prescribed to reduce swelling and congestion of the tubes themselves.  Although you feel brief relief from the bronchodilator inhaler, your asthma may actually be worsening with the tubes becoming more swollen and filled with mucus.  This can delay other crucial treatments, such as oral steroid medications. If you need to use a bronchodilator inhaler daily, several times per day, you should discuss this with your provider.  There are probably better treatments that could be used to keep your asthma under control.     HOME CARE . Only take medications as instructed by your medical team. . Complete the entire course of an antibiotic. . Drink plenty of fluids and get plenty of rest. . Avoid close contacts especially the very young and the elderly . Cover your mouth if you cough or cough into your sleeve. . Always remember to wash your hands . A steam or ultrasonic humidifier can help congestion.   GET HELP RIGHT AWAY IF: . You develop worsening fever. . You become short of breath . You cough up blood. . Your symptoms persist after you have completed your treatment plan MAKE SURE YOU   Understand these instructions.  Will watch your condition.  Will get help right away if you  are not doing well or get worse.  Your e-visit answers were reviewed by a board certified advanced clinical practitioner to complete your personal care plan.  Depending on the condition, your plan could have included both over the counter or prescription medications. If there is a problem please reply  once you have received a response from your provider. Your safety is important to [place].  If you have drug allergies check your prescription carefully.    You can use MyChart to ask questions about today's visit, request a  non-urgent call back, or ask for a work or school excuse for 24 hours related to this e-Visit. If it has been greater than 24 hours you will need to follow up with your provider, or enter a new e-Visit to address those concerns. You will get an e-mail in the next two days asking about your experience.  I hope that your e-visit has been valuable and will speed your recovery. Thank you for using e-visits.

## 2017-12-13 ENCOUNTER — Ambulatory Visit (HOSPITAL_COMMUNITY)
Admission: RE | Admit: 2017-12-13 | Discharge: 2017-12-13 | Disposition: A | Discharge status: Home / Self Care | Payer: 59 | Source: Ambulatory Visit | Attending: Physician Assistant | Admitting: Physician Assistant

## 2017-12-13 DIAGNOSIS — K5900 Constipation, unspecified: Secondary | ICD-10-CM | POA: Diagnosis not present

## 2017-12-13 DIAGNOSIS — R112 Nausea with vomiting, unspecified: Secondary | ICD-10-CM | POA: Diagnosis not present

## 2017-12-13 DIAGNOSIS — R1013 Epigastric pain: Secondary | ICD-10-CM | POA: Insufficient documentation

## 2017-12-13 DIAGNOSIS — K582 Mixed irritable bowel syndrome: Secondary | ICD-10-CM

## 2017-12-13 DIAGNOSIS — R140 Abdominal distension (gaseous): Secondary | ICD-10-CM | POA: Insufficient documentation

## 2017-12-24 MED FILL — OMEPRAZOLE DR 40 MG CAPSULE: 40 | 90 days supply | Qty: 90 | Fill #0

## 2017-12-24 MED FILL — SIMVASTATIN 20 MG TABLET: 20 | 90 days supply | Qty: 90 | Fill #1

## 2017-12-28 DIAGNOSIS — K589 Irritable bowel syndrome without diarrhea: Secondary | ICD-10-CM | POA: Diagnosis not present

## 2017-12-28 DIAGNOSIS — R1013 Epigastric pain: Secondary | ICD-10-CM | POA: Diagnosis not present

## 2017-12-28 DIAGNOSIS — D369 Benign neoplasm, unspecified site: Secondary | ICD-10-CM | POA: Diagnosis not present

## 2017-12-28 DIAGNOSIS — K219 Gastro-esophageal reflux disease without esophagitis: Secondary | ICD-10-CM | POA: Diagnosis not present

## 2017-12-28 MED FILL — DICYCLOMINE 10 MG CAPSULE: 10 | 30 days supply | Qty: 120 | Fill #0

## 2018-01-06 ENCOUNTER — Telehealth: Payer: 59 | Admitting: Physician Assistant

## 2018-01-06 DIAGNOSIS — L719 Rosacea, unspecified: Secondary | ICD-10-CM

## 2018-01-06 MED ORDER — METRONIDAZOLE 1 % EX GEL
Freq: Every day | CUTANEOUS | 0 refills | Status: DC
Start: 2018-01-06 — End: 2020-09-10

## 2018-01-06 MED FILL — metroNIDAZOLE 1 % GEL: 1 | 30 days supply | Qty: 60 | Fill #0

## 2018-01-06 NOTE — Progress Notes (Signed)
E visit for Rosacea We are sorry that you are not feeling well. Here is how we plan to help! Based on what you shared with me it looks like you have Rosacea.  Rosacea is a common chronic skin condition that usually only affects the face and eyes.  Occasionally, the neck, chest, or other areas may be involved.  Characterized by redness, pimples, and broken blood vessels, rosacea tends to begin after middle age (between the ages of 70 and 58).  It is more common in fair-skinned people and women in menopause. It may appear differently in dark skinned people but rosacea effects all ethnic groups.  The cause of rosacea is not fully understood. We do know that rosacea is worsened by various trigger factors including, spicy or hot foods, hot beverages such as coffee or tea, alcohol, and sun exposure just to name a few.  Signs of rosacea may vary greatly from person to person. In some individuals it may only flare up from time to time.   I have prescribed: A topical antibiotic called Metronidazole 1%.  Apply a thin film to affected area once daily.  Wash your hands before and after use. Make sure your skin is clean and dry.  Rub in gently and completely over the affected areas.   HOME CARE: . Keep a record of triggers, such as stress, weather, or certain foods or drinks. Consider limiting hot or spicy foods and alcohol. . Always use sunscreen that protects against UVA and UVB rays and has a sun-protecting factor (SPF) of 15 or higher. . Avoid putting steroids on the skin sores. Steroids may make rosacea worse.  . You may use small amounts of water based cosmetic while using this medication.  Apply cosmetics after cream has dried. . If you shave your face, use an Copy . Don't scrub your skin or use sponges, brushes, or other abrasive tools. Doing so can irritate your skin. GET HELP RIGHT AWAY IF: . If your rosacea gets worse or is not better within 4 weeks. . If a new skin condition or rash  develops. . Loss of feeling or tingling of treated area . Nausea  MAKE SURE YOU    Understand these instructions.  Will watch your condition.  Will get help right away if you are not doing well or get worse.  Your e-visit answers were reviewed by a board certified advanced clinical practitioner to complete your personal care plan. Depending upon the condition, your plan could have included both over the counter or prescription medications. Please review your pharmacy choice. Be sure that the pharmacy you have chosen is open so that you can pick up your prescription now.  If there is a problem you may message your provider in [place] to have the prescription routed to another pharmacy. Your safety is important to [place]. If you have drug allergies check your prescription carefully.  For the next 24 hours, you can use MyChart to ask questions about today's visit, request a non-urgent call back, or ask for a work or school excuse from your e-visit provider. You will get an email in the next two days asking about your experience. I hope that your e-visit has been valuable and will speed your recovery.

## 2018-01-24 ENCOUNTER — Other Ambulatory Visit: Payer: Self-pay | Admitting: Internal Medicine

## 2018-01-24 MED FILL — LOSARTAN POTASSIUM 50 MG TA: 50 | 90 days supply | Qty: 90 | Fill #1

## 2018-01-24 MED FILL — ACCU-CHEK GUIDE STRP: 67 days supply | Qty: 200 | Fill #0

## 2018-02-14 ENCOUNTER — Other Ambulatory Visit: Payer: Self-pay | Admitting: Internal Medicine

## 2018-02-14 MED FILL — METFORMIN HCL ER 500 MG TAB: 500 | 30 days supply | Qty: 120 | Fill #0

## 2018-02-25 ENCOUNTER — Ambulatory Visit (INDEPENDENT_AMBULATORY_CARE_PROVIDER_SITE_OTHER): Payer: 59 | Admitting: Internal Medicine

## 2018-02-25 ENCOUNTER — Encounter: Payer: Self-pay | Admitting: Internal Medicine

## 2018-02-25 VITALS — BP 118/78 | HR 98 | Ht 63.5 in | Wt 217.0 lb

## 2018-02-25 DIAGNOSIS — E669 Obesity, unspecified: Secondary | ICD-10-CM | POA: Insufficient documentation

## 2018-02-25 DIAGNOSIS — E1165 Type 2 diabetes mellitus with hyperglycemia: Secondary | ICD-10-CM | POA: Diagnosis not present

## 2018-02-25 DIAGNOSIS — E782 Mixed hyperlipidemia: Secondary | ICD-10-CM | POA: Diagnosis not present

## 2018-02-25 LAB — POCT GLYCOSYLATED HEMOGLOBIN (HGB A1C): HEMOGLOBIN A1C: 7.4 % — AB (ref 4.0–5.6)

## 2018-02-25 MED ORDER — CANAGLIFLOZIN 300 MG PO TABS: ORAL_TABLET | ORAL | 3 refills | Status: DC

## 2018-02-25 MED ORDER — GLIPIZIDE 5 MG PO TABS: ORAL_TABLET | ORAL | 3 refills | Status: DC

## 2018-02-25 MED ORDER — METFORMIN HCL ER 500 MG PO TB24: 1000.0000 mg | ORAL_TABLET | Freq: Two times a day (BID) | ORAL | 3 refills | Status: DC

## 2018-02-25 MED FILL — glipiZIDE 5 MG TABS: 5 | 90 days supply | Qty: 270 | Fill #0

## 2018-02-25 MED FILL — INVOKANA 300 MG TABLET: 300 | 90 days supply | Qty: 90 | Fill #0

## 2018-02-25 NOTE — Patient Instructions (Addendum)
Please continue: Metformin XR 2000 mg at bedtime Invokana 300 mg daily in am Glipizide 5 mg before b'fast and 10 mg before dinner  Please return in 4 months with your sugar log.

## 2018-02-25 NOTE — Progress Notes (Signed)
Patient ID: Angela Casey, female   DOB: Jul 03, 1960, 57 y.o.   MRN: [id number]  HPI: Angela Casey is a 57 y.o.-year-old female, initially referred by her PCP, Dr. Wilhemena Durie, for management of DM2, dx 2010, previously non-insulin-dependent, uncontrolled, without long term complications. Last visit 6 months ago.  Since last visit, she changed her diet >> mostly plant based >> lost ~15 lbs. Her reflux, sleep, sugars >> better.  She also feels much better and would like to continue with the diet.  Last hemoglobin A1c was: Lab Results  Component Value Date   HGBA1C 8.6 08/06/2017   HGBA1C 8.0 10/06/2016   HGBA1C 9.2% 05/27/2016   HGBA1C 8.4 02/13/2016   HGBA1C 7.4 07/23/2015   HGBA1C 8.5 04/24/2015   HGBA1C 8.9 (H) 08/01/2014   HGBA1C 9.7 (H) 03/08/2014   HGBA1C 9.1 (H) 12/05/2013   HGBA1C 11.1 (H) 06/29/2013  - 09/25/2013: HbA1c 8.3% - 12/27/2012: HbA1c 9.2% - previously: HbA1c 8.4%  Pt is on a regimen of: Metformin XR 2000 mg at bedtime Invokana 300 mg daily in am Glipizide 5 mg before b'fast and 10 mg before dinner She did not start insulin as suggested in 08/2017. She tried Januvia - started 07/2014 >> constipation (Mg did not help) >> try to stop and restart >> sxs restarted >> had same sxs >> she stopped 05/2015. She stopped Victoza 1.8 mg daily >> developed N/C/AP  Pt checks her sugars 2-3x a day: - am: 140-170 >> 118-152 >> 130-160s >> 129-179 (ave 140-150), 183 - 2h after b'fast:131 >> 101-151 >> 123 >> n/c  - before lunch: 120s >> 71, 88, 90, 237 >> 103 >> 152 - 2h after lunch: 146 >> 102, 228 >> n/c - before dinner: 130-150 >> 84, 157 >> 157, 161, 209 >> 127, 139, 155 - 2h after dinner: n/c >> 91-196 >> n/c - bedtime: 170- 200 >> 109, 176, 274, 316 >> 189 >> n/c - nighttime: n/c Lowest sugar was 70 >> 103 >> 85; she has hypoglycemia awareness in the 80. Highest sugar was 316 >> 209 >> 256 (11 pm)  [facility] was taking [facility] classes [facility].  -no CKD, last BUN/creatinine:  Lab Results  Component Value Date   BUN 19 10/06/2016   Lab Results  Component Value Date   CREATININE 0.72 10/06/2016  On Losartan. -+ HL; lipid panel: Lab Results  Component Value Date   CHOL 135 10/06/2016   HDL 36.40 (L) 10/06/2016   LDLCALC 67 10/06/2016   TRIG 160.0 (H) 10/06/2016   CHOLHDL 4 10/06/2016  On Simvastatin - h/o rash on Livalo.   - last eye exam was in 05/2017: No DR - no numbness and tingling in her feet. + R foot pain. H/o steroid inj's.  ROS: Constitutional: + weight loss, no fatigue, no subjective hyperthermia, no subjective hypothermia Eyes: no blurry vision, no xerophthalmia ENT: no sore throat, no nodules palpated in throat, no dysphagia, no odynophagia, no hoarseness Cardiovascular: no CP/no SOB/no palpitations/no leg swelling Respiratory: no cough/no SOB/no wheezing Gastrointestinal: no N/no V/no D/no C/no acid reflux Musculoskeletal: no muscle aches/no joint aches Skin: no rashes, + thinner hair Neurological: no tremors/no numbness/no tingling/no dizziness  I reviewed pt's medications, allergies, PMH, social hx, family hx, and changes were documented in the history of present illness. Otherwise, unchanged from my initial visit note.  Past Medical History:  Diagnosis Date  . Allergy   . Arthritis    knees  . Asthma  asthmatic bronchitis  . Diabetes mellitus without complication ([place])   . GERD (gastroesophageal reflux disease)   . Hypercholesteremia    Past Surgical History:  Procedure Laterality Date  . ANTERIOR CRUCIATE LIGAMENT REPAIR Right   . BACK SURGERY     L5-S1  . CARPAL TUNNEL RELEASE    . KNEE ARTHROSCOPY Left 06/28/2015   Procedure: Left Knee Arthroscopy and Debridement;  Surgeon: Meredith Pel, MD;  Location: [place];  Service: Orthopedics;  Laterality: Left;  . KNEE ARTHROSCOPY WITH MEDIAL MENISECTOMY Left 06/28/2015   Procedure: KNEE ARTHROSCOPY WITH MEDIAL  MENISECTOMY;  Surgeon: Meredith Pel, MD;  Location: [place];  Service: Orthopedics;  Laterality: Left;  . KNEE SURGERY     Social History   Socioeconomic History  . Marital status: Divorced    Spouse name: Not on file  . Number of children: Not on file  . Years of education: Not on file  . Highest education level: Not on file  Occupational History  . Not on file  Social Needs  . Financial resource strain: Not on file  . Food insecurity:    Worry: Not on file    Inability: Not on file  . Transportation needs:    Medical: Not on file    Non-medical: Not on file  Tobacco Use  . Smoking status: Never Smoker  . Smokeless tobacco: Never Used  Substance and Sexual Activity  . Alcohol use: Yes    Alcohol/week: 0.0 standard drinks    Comment: social  . Drug use: No  . Sexual activity: Never    Birth control/protection: Abstinence  Lifestyle  . Physical activity:    Days per week: Not on file    Minutes per session: Not on file  . Stress: Not on file  Relationships  . Social connections:    Talks on phone: Not on file    Gets together: Not on file    Attends religious service: Not on file    Active member of club or organization: Not on file    Attends meetings of clubs or organizations: Not on file    Relationship status: Not on file  . Intimate partner violence:    Fear of current or ex partner: Not on file    Emotionally abused: Not on file    Physically abused: Not on file    Forced sexual activity: Not on file  Other Topics Concern  . Not on file  Social History Narrative  . Not on file   Current Outpatient Medications on File Prior to Visit  Medication Sig Dispense Refill  . ACCU-CHEK FASTCLIX LANCETS MISC Use to check sugar 3 times daily 300 each 5  . ACCU-CHEK GUIDE test strip USE AS INSTRUCTED TO CHECK SUGAR 3 TIMES DAILY 300 each 5  . Albuterol Sulfate (PROAIR RESPICLICK) 627 (90 Base) MCG/ACT AEPB Inhale 2 puffs into the lungs every 4  (four) hours as needed (shortness of breath). 1 each 2  . benzonatate (TESSALON PERLES) 100 MG capsule Take 1-2 capsules (100-200 mg total) by mouth every 8 (eight) hours as needed for cough. 30 capsule 0  . Blood Glucose Monitoring Suppl (ACCU-CHEK GUIDE) w/Device KIT 1 Device by Does not apply route daily. 1 kit 0  . Dupilumab (DUPIXENT) 300 MG/2ML SOSY Inject 300 mg into the skin every 14 (fourteen) days. 4 mL 10  . glipiZIDE (GLUCOTROL) 5 MG tablet TAKE 1 TABLET BEFORE BREAKFAST AND 2 TABLETS BEFORE DINNER. 270  tablet 1  . ibuprofen (ADVIL,MOTRIN) 600 MG tablet Take 600 mg by mouth every 6 (six) hours as needed.    . Insulin Glargine (LANTUS SOLOSTAR) 100 UNIT/ML Solostar Pen Inject 10 Units into the skin daily at 10 pm. 15 mL 11  . Insulin Pen Needle (CAREFINE PEN NEEDLES) 32G X 4 MM MISC Use 1x a day 100 each 3  . INVOKANA 300 MG TABS tablet TAKE 1 TABLET ONCE A DAY BY MOUTH 90 tablet 1  . loratadine (CLARITIN) 10 MG tablet Take by mouth.    . losartan (COZAAR) 50 MG tablet TAKE 1 TABLET BY MOUTH ONCE DAILY 90 tablet 1  . metFORMIN (GLUCOPHAGE-XR) 500 MG 24 hr tablet Take 2 tablets (1,000 mg total) by mouth 2 (two) times daily. NEED APPOINTMENT 120 tablet 0  . metroNIDAZOLE (METROGEL) 1 % gel Apply topically daily. During flares. 45 g 0  . omeprazole (PRILOSEC) 40 MG capsule   11  . predniSONE (DELTASONE) 5 MG tablet Take 1 tablet (5 mg total) by mouth as directed. Taper 6,5,4,3,2,1 21 tablet 0  . simvastatin (ZOCOR) 20 MG tablet TAKE 1 TABLET (20 MG TOTAL) BY MOUTH AT BEDTIME.     Current Facility-Administered Medications on File Prior to Visit  Medication Dose Route Frequency Provider Last Rate Last Dose  . albuterol (PROVENTIL) (2.5 MG/3ML) 0.083% nebulizer solution 2.5 mg  2.5 mg Nebulization Once Wendie Agreste, MD      . betamethasone acetate-betamethasone sodium phosphate (CELESTONE) injection 3 mg  3 mg Intramuscular Once Daylene Katayama M, DPM      . betamethasone  acetate-betamethasone sodium phosphate (CELESTONE) injection 3 mg  3 mg Intramuscular Once Daylene Katayama M, DPM      . betamethasone acetate-betamethasone sodium phosphate (CELESTONE) injection 3 mg  3 mg Intramuscular Once Daylene Katayama M, DPM      . ipratropium (ATROVENT) nebulizer solution 0.5 mg  0.5 mg Nebulization Once Wendie Agreste, MD       Allergies  Allergen Reactions  . Livalo [Pitavastatin] Itching, Rash and Hives  . Exenatide Nausea And Vomiting    diarrhea  . Pollen Extract     Other reaction(s): sneezing   Family History  Problem Relation Age of Onset  . Cancer Mother        uterine  . Diabetes Father   . COPD Father   . Asthma Father   . Diabetes Paternal Grandfather   . Breast cancer Paternal Grandmother        in 62's  . Allergic rhinitis Neg Hx   . Angioedema Neg Hx   . Atopy Neg Hx   . Eczema Neg Hx   . Immunodeficiency Neg Hx   . Urticaria Neg Hx     PE: BP 118/78   Pulse 98   Ht 5' 3.5" (1.613 m)   Wt 217 lb (98.4 kg)   SpO2 98%   BMI 37.84 kg/m  Body mass index is 37.84 kg/m.  Wt Readings from Last 3 Encounters:  02/25/18 217 lb (98.4 kg)  08/06/17 233 lb (105.7 kg)  10/06/16 220 lb (99.8 kg)   Constitutional: overweight, in NAD Eyes: PERRLA, EOMI, no exophthalmos ENT: moist mucous membranes, no thyromegaly, no cervical lymphadenopathy Cardiovascular: Tachycardia, RR, No MRG Respiratory: CTA B Gastrointestinal: abdomen soft, NT, ND, BS+ Musculoskeletal: no deformities, strength intact in all 4 Skin: moist, warm, no rashes Neurological: no tremor with outstretched hands, DTR normal in all 4  ASSESSMENT: 1. DM2, non-insulin-dependent, uncontrolled, with hyperglycemia   -  In 05/2016 I suggested a weekly GLP1R agonist but she wanted to try diet and exercise first  2. HL  3. Obesity  PLAN:  1. Patient with history of poorly controlled diabetes, worse at last visit, at which time I advised her to add basal insulin.  She tried this for  a period of time and her sugars did improve some, but she decided to try to come off insulin while improving her diet since then.  She switched to a mostly plant-based diet and she started to do very well, losing weight (she lost a total of 16 pounds), sleeping better, and overall feeling much energetic.  Her sugars also improved.  She would like to continue with the diet. - At this visit, sugars are now quite at goal, but definitely improved compared to before. - Since she wants to continue with the diet, will not change the regimen at this time.  It is okay to stay off Lantus for now and continue with metformin, Invokana, glipizide. - I suggested: Patient Instructions  Please continue: Metformin XR 2000 mg at bedtime Invokana 300 mg daily in am Glipizide 5 mg before b'fast and 10 mg before dinner  Please return in 4 months with your sugar log.  - today, HbA1c is 7.4% (much better) - continue checking sugars at different times of the day - check 1x a day, rotating checks - advised for yearly eye exams >> she is UTD - Return to clinic in 4 mo with sugar log    2. HL  - Reviewed latest lipid panel from 10/2016: LDL at goal, triglycerides slightly high, HDL low Lab Results  Component Value Date   CHOL 135 10/06/2016   HDL 36.40 (L) 10/06/2016   LDLCALC 67 10/06/2016   TRIG 160.0 (H) 10/06/2016   CHOLHDL 4 10/06/2016  - Continues simvastatin without side effects. -She had another set of lipids by PCP and she will have this sent to me.  3. Obesity - lost 16 lbs in last 6 mo - plant-based diet >> strongly advised her to continue.  She also plans to start exercise. - continue Invokana which should also helpwith wt loss  Philemon Kingdom, MD PhD [facility] Endocrinology

## 2018-03-07 ENCOUNTER — Encounter: Payer: Self-pay | Admitting: Internal Medicine

## 2018-03-07 ENCOUNTER — Other Ambulatory Visit: Payer: Self-pay | Admitting: Internal Medicine

## 2018-03-07 ENCOUNTER — Other Ambulatory Visit (INDEPENDENT_AMBULATORY_CARE_PROVIDER_SITE_OTHER): Payer: 59

## 2018-03-07 DIAGNOSIS — L659 Nonscarring hair loss, unspecified: Secondary | ICD-10-CM | POA: Diagnosis not present

## 2018-03-07 LAB — T3, FREE: T3, Free: 4.1 pg/mL (ref 2.3–4.2)

## 2018-03-07 LAB — T4, FREE: Free T4: 0.8 ng/dL (ref 0.60–1.60)

## 2018-03-07 LAB — TSH: TSH: 3.33 u[IU]/mL (ref 0.35–4.50)

## 2018-03-07 MED FILL — INVOKANA 300 MG TABLET: 300 | 90 days supply | Qty: 90 | Fill #0

## 2018-03-21 MED FILL — metFORMIN HCL ER 500 MG TB2: 500 | 90 days supply | Qty: 360 | Fill #0

## 2018-03-21 MED FILL — SIMVASTATIN 20 MG TABLET: 20 | 90 days supply | Qty: 90 | Fill #2

## 2018-05-03 ENCOUNTER — Other Ambulatory Visit: Payer: Self-pay | Admitting: Obstetrics & Gynecology

## 2018-05-03 DIAGNOSIS — Z1231 Encounter for screening mammogram for malignant neoplasm of breast: Secondary | ICD-10-CM

## 2018-05-04 ENCOUNTER — Other Ambulatory Visit: Payer: Self-pay | Admitting: Obstetrics & Gynecology

## 2018-05-04 DIAGNOSIS — Z1231 Encounter for screening mammogram for malignant neoplasm of breast: Secondary | ICD-10-CM

## 2018-05-11 DIAGNOSIS — H52213 Irregular astigmatism, bilateral: Secondary | ICD-10-CM | POA: Diagnosis not present

## 2018-05-11 DIAGNOSIS — H524 Presbyopia: Secondary | ICD-10-CM | POA: Diagnosis not present

## 2018-05-11 DIAGNOSIS — H5213 Myopia, bilateral: Secondary | ICD-10-CM | POA: Diagnosis not present

## 2018-05-12 DIAGNOSIS — Z803 Family history of malignant neoplasm of breast: Secondary | ICD-10-CM | POA: Diagnosis not present

## 2018-05-12 DIAGNOSIS — Z1231 Encounter for screening mammogram for malignant neoplasm of breast: Secondary | ICD-10-CM | POA: Diagnosis not present

## 2018-05-13 MED FILL — OMEPRAZOLE 40 MG CPDR: 40 | 90 days supply | Qty: 90 | Fill #0

## 2018-05-27 ENCOUNTER — Other Ambulatory Visit: Payer: Self-pay | Admitting: Internal Medicine

## 2018-05-27 MED FILL — LOSARTAN POTASSIUM 50 MG TA: 50 | 90 days supply | Qty: 90 | Fill #0

## 2018-06-07 DIAGNOSIS — Z6841 Body Mass Index (BMI) 40.0 and over, adult: Secondary | ICD-10-CM | POA: Diagnosis not present

## 2018-06-07 DIAGNOSIS — Z01419 Encounter for gynecological examination (general) (routine) without abnormal findings: Secondary | ICD-10-CM | POA: Diagnosis not present

## 2018-06-24 MED FILL — metFORMIN HCL ER 500 MG TB2: 500 | 90 days supply | Qty: 360 | Fill #1

## 2018-06-30 ENCOUNTER — Telehealth: Payer: Self-pay | Admitting: Internal Medicine

## 2018-06-30 ENCOUNTER — Ambulatory Visit: Payer: 59 | Admitting: Internal Medicine

## 2018-06-30 DIAGNOSIS — Z0289 Encounter for other administrative examinations: Secondary | ICD-10-CM

## 2018-06-30 NOTE — Telephone Encounter (Signed)
3mo

## 2018-06-30 NOTE — Telephone Encounter (Signed)
Patient no showed today's appt. Please advise on how to follow up. °A. No follow up necessary. °B. Follow up urgent. Contact patient immediately. °C. Follow up necessary. Contact patient and schedule visit in ___ days. °D. Follow up advised. Contact patient and schedule visit in ____weeks. ° °Would you like the NS fee to be applied to this visit? ° °

## 2018-06-30 NOTE — Progress Notes (Deleted)
Patient ID: Angela Casey, female   DOB: 02/09/1961, 58 y.o.   MRN: [id number]  HPI: Angela Casey is a 58 y.o.-year-old female, initially referred by her PCP, Dr. Wilhemena Durie, for management of DM2, dx 2010, previously non-insulin-dependent, uncontrolled, without long term complications. Last visit 4 months ago.  She continues on a mostly plant-based diet.  Sugars continue to be controlled.  Last hemoglobin A1c was: Lab Results  Component Value Date   HGBA1C 7.4 (A) 02/25/2018   HGBA1C 8.6 08/06/2017   HGBA1C 8.0 10/06/2016   HGBA1C 9.2% 05/27/2016   HGBA1C 8.4 02/13/2016   HGBA1C 7.4 07/23/2015   HGBA1C 8.5 04/24/2015   HGBA1C 8.9 (H) 08/01/2014   HGBA1C 9.7 (H) 03/08/2014   HGBA1C 9.1 (H) 12/05/2013  - 09/25/2013: HbA1c 8.3% - 12/27/2012: HbA1c 9.2% - previously: HbA1c 8.4%  Pt is on a regimen of: Metformin XR 2000 mg at bedtime Invokana 300 mg daily in am Glipizide 5 mg before b'fast and 10 mg before dinner She did not start insulin as suggested in 08/2017. She tried Januvia - started 07/2014 >> constipation (Mg did not help) >> try to stop and restart >> sxs restarted >> had same sxs >> she stopped 05/2015. She stopped Victoza 1.8 mg daily >> developed N/C/AP  Pt checks her sugars 2-3x a day: - am: 130-160s >> 129-179 (ave 140-150), 183 - 2h after b'fast: 101-151 >> 123 >> n/c  - before lunch: 71, 88, 90, 237 >> 103 >> 152 - 2h after lunch: 146 >> 102, 228 >> n/c - before dinner: 157, 161, 209 >> 127, 139, 155 - 2h after dinner: n/c >> 91-196 >> n/c - bedtime: 109, 176, 274, 316 >> 189 >> n/c - nighttime: n/c Lowest sugar was 70 >> 103 >> 85 >> ***; she has hypoglycemia awareness in the 80s. Highest sugar was 316 >> 209 >> 256 (11 pm) >> ***.  [facility]  -No CKD, last BUN/creatinine:  Lab Results  Component Value Date   BUN 19 10/06/2016   Lab Results  Component Value Date   CREATININE 0.72 10/06/2016  On  losartan. -+ HL; lipid panel: Lab Results  Component Value Date   CHOL 135 10/06/2016   HDL 36.40 (L) 10/06/2016   LDLCALC 67 10/06/2016   TRIG 160.0 (H) 10/06/2016   CHOLHDL 4 10/06/2016  On simvastatin.  She has a history of rash on Livalo. - last eye exam was in 05/2017: No DR - no numbness and tingling in her feet.  She has right foot pain. H/o steroid inj's.  ROS: Constitutional: no weight gain/no weight loss, no fatigue, no subjective hyperthermia, no subjective hypothermia Eyes: no blurry vision, no xerophthalmia ENT: no sore throat, no nodules palpated in neck, no dysphagia, no odynophagia, no hoarseness Cardiovascular: no CP/no SOB/no palpitations/no leg swelling Respiratory: no cough/no SOB/no wheezing Gastrointestinal: no N/no V/no D/no C/no acid reflux Musculoskeletal: no muscle aches/no joint aches Skin: no rashes, no hair loss Neurological: no tremors/no numbness/no tingling/no dizziness  I reviewed pt's medications, allergies, PMH, social hx, family hx, and changes were documented in the history of present illness. Otherwise, unchanged from my initial visit note.   Past Medical History:  Diagnosis Date  . Allergy   . Arthritis    knees  . Asthma    asthmatic bronchitis  . Diabetes mellitus without complication ([place])   . GERD (gastroesophageal reflux disease)   . Hypercholesteremia    Past Surgical  History:  Procedure Laterality Date  . ANTERIOR CRUCIATE LIGAMENT REPAIR Right   . BACK SURGERY     L5-S1  . CARPAL TUNNEL RELEASE    . KNEE ARTHROSCOPY Left 06/28/2015   Procedure: Left Knee Arthroscopy and Debridement;  Surgeon: Meredith Pel, MD;  Location: [place];  Service: Orthopedics;  Laterality: Left;  . KNEE ARTHROSCOPY WITH MEDIAL MENISECTOMY Left 06/28/2015   Procedure: KNEE ARTHROSCOPY WITH MEDIAL MENISECTOMY;  Surgeon: Meredith Pel, MD;  Location: [place];  Service: Orthopedics;  Laterality: Left;  .  KNEE SURGERY     Social History   Socioeconomic History  . Marital status: Divorced    Spouse name: Not on file  . Number of children: Not on file  . Years of education: Not on file  . Highest education level: Not on file  Occupational History  . Not on file  Social Needs  . Financial resource strain: Not on file  . Food insecurity:    Worry: Not on file    Inability: Not on file  . Transportation needs:    Medical: Not on file    Non-medical: Not on file  Tobacco Use  . Smoking status: Never Smoker  . Smokeless tobacco: Never Used  Substance and Sexual Activity  . Alcohol use: Yes    Alcohol/week: 0.0 standard drinks    Comment: social  . Drug use: No  . Sexual activity: Never    Birth control/protection: Abstinence  Lifestyle  . Physical activity:    Days per week: Not on file    Minutes per session: Not on file  . Stress: Not on file  Relationships  . Social connections:    Talks on phone: Not on file    Gets together: Not on file    Attends religious service: Not on file    Active member of club or organization: Not on file    Attends meetings of clubs or organizations: Not on file    Relationship status: Not on file  . Intimate partner violence:    Fear of current or ex partner: Not on file    Emotionally abused: Not on file    Physically abused: Not on file    Forced sexual activity: Not on file  Other Topics Concern  . Not on file  Social History Narrative  . Not on file   Current Outpatient Medications on File Prior to Visit  Medication Sig Dispense Refill  . ACCU-CHEK FASTCLIX LANCETS MISC Use to check sugar 3 times daily 300 each 5  . ACCU-CHEK GUIDE test strip USE AS INSTRUCTED TO CHECK SUGAR 3 TIMES DAILY 300 each 5  . Albuterol Sulfate (PROAIR RESPICLICK) 229 (90 Base) MCG/ACT AEPB Inhale 2 puffs into the lungs every 4 (four) hours as needed (shortness of breath). 1 each 2  . benzonatate (TESSALON PERLES) 100 MG capsule Take 1-2 capsules (100-200  mg total) by mouth every 8 (eight) hours as needed for cough. 30 capsule 0  . Blood Glucose Monitoring Suppl (ACCU-CHEK GUIDE) w/Device KIT 1 Device by Does not apply route daily. 1 kit 0  . canagliflozin (INVOKANA) 300 MG TABS tablet TAKE 1 TABLET ONCE A DAY BY MOUTH 90 tablet 3  . Dupilumab (DUPIXENT) 300 MG/2ML SOSY Inject 300 mg into the skin every 14 (fourteen) days. 4 mL 10  . glipiZIDE (GLUCOTROL) 5 MG tablet TAKE 1 TABLET BEFORE BREAKFAST AND 2 TABLETS BEFORE DINNER. 270 tablet 3  . ibuprofen (ADVIL,MOTRIN)  600 MG tablet Take 600 mg by mouth every 6 (six) hours as needed.    . loratadine (CLARITIN) 10 MG tablet Take by mouth.    . losartan (COZAAR) 50 MG tablet TAKE 1 TABLET BY MOUTH ONCE DAILY 90 tablet 1  . metFORMIN (GLUCOPHAGE-XR) 500 MG 24 hr tablet Take 2 tablets (1,000 mg total) by mouth 2 (two) times daily. 360 tablet 3  . metroNIDAZOLE (METROGEL) 1 % gel Apply topically daily. During flares. 45 g 0  . omeprazole (PRILOSEC) 40 MG capsule   11  . predniSONE (DELTASONE) 5 MG tablet Take 1 tablet (5 mg total) by mouth as directed. Taper 6,5,4,3,2,1 21 tablet 0  . simvastatin (ZOCOR) 20 MG tablet TAKE 1 TABLET (20 MG TOTAL) BY MOUTH AT BEDTIME.     Current Facility-Administered Medications on File Prior to Visit  Medication Dose Route Frequency Provider Last Rate Last Dose  . albuterol (PROVENTIL) (2.5 MG/3ML) 0.083% nebulizer solution 2.5 mg  2.5 mg Nebulization Once Wendie Agreste, MD      . betamethasone acetate-betamethasone sodium phosphate (CELESTONE) injection 3 mg  3 mg Intramuscular Once Daylene Katayama M, DPM      . betamethasone acetate-betamethasone sodium phosphate (CELESTONE) injection 3 mg  3 mg Intramuscular Once Daylene Katayama M, DPM      . betamethasone acetate-betamethasone sodium phosphate (CELESTONE) injection 3 mg  3 mg Intramuscular Once Daylene Katayama M, DPM      . ipratropium (ATROVENT) nebulizer solution 0.5 mg  0.5 mg Nebulization Once Wendie Agreste, MD        Allergies  Allergen Reactions  . Livalo [Pitavastatin] Itching, Rash and Hives  . Exenatide Nausea And Vomiting    diarrhea  . Pollen Extract     Other reaction(s): sneezing   Family History  Problem Relation Age of Onset  . Cancer Mother        uterine  . Diabetes Father   . COPD Father   . Asthma Father   . Diabetes Paternal Grandfather   . Breast cancer Paternal Grandmother        in 22's  . Allergic rhinitis Neg Hx   . Angioedema Neg Hx   . Atopy Neg Hx   . Eczema Neg Hx   . Immunodeficiency Neg Hx   . Urticaria Neg Hx     PE: There were no vitals taken for this visit. There is no height or weight on file to calculate BMI.  Wt Readings from Last 3 Encounters:  02/25/18 217 lb (98.4 kg)  08/06/17 233 lb (105.7 kg)  10/06/16 220 lb (99.8 kg)   Constitutional: overweight, in NAD Eyes: PERRLA, EOMI, no exophthalmos ENT: moist mucous membranes, no thyromegaly, no cervical lymphadenopathy Cardiovascular: RRR, No MRG Respiratory: CTA B Gastrointestinal: abdomen soft, NT, ND, BS+ Musculoskeletal: no deformities, strength intact in all 4 Skin: moist, warm, no rashes Neurological: no tremor with outstretched hands, DTR normal in all 4  ASSESSMENT: 1. DM2, non-insulin-dependent, uncontrolled, with hyperglycemia   - In 05/2016 I suggested a weekly GLP1R agonist but she wanted to try diet and exercise first  2. HL  3. Obesity  PLAN:  1. Patient with history of poorly controlled diabetes, with significant control after she switched to a more plant-based diet.  She also lost 16 pounds before last visit, she was sleeping better and overall feeling much energetic.  At last visit, sugars were improved, not quite at goal.  We did not change her regimen and continued off  Lantus at that time as she was planning to continue the diet.   - I suggested: Patient Instructions  Please continue: Metformin XR 2000 mg at bedtime Invokana 300 mg daily in am Glipizide 5 mg before  b'fast and 10 mg before dinner  Please return in 4 months with your sugar log.  - today, HbA1c is 7%  - continue checking sugars at different times of the day - check 1x a day, rotating checks - advised for yearly eye exams >> she is UTD - Return to clinic in 3 mo with sugar log    2. HL  -Reviewed latest lipid panel from 10/2016: LDL at goal, triglycerides slightly high, HDL low Lab Results  Component Value Date   CHOL 135 10/06/2016   HDL 36.40 (L) 10/06/2016   LDLCALC 67 10/06/2016   TRIG 160.0 (H) 10/06/2016   CHOLHDL 4 10/06/2016  -Continue simvastatin without side effects -She had another set of lipids by PCP before last visit.  We will need to get records.  3. Obesity -She did wonderful before last visit on a plant-based diet.  Lost 16 pounds in 6 months.  She was also planning to start exercise. -  -We will continue Invokana which should also help with weight loss  Philemon Kingdom, MD PhD [facility] Endocrinology

## 2018-07-08 ENCOUNTER — Other Ambulatory Visit: Payer: Self-pay | Admitting: Internal Medicine

## 2018-07-08 MED FILL — SIMVASTATIN 20 MG TABLET: 20 | 90 days supply | Qty: 90 | Fill #0

## 2018-07-08 NOTE — Telephone Encounter (Signed)
OK to refill

## 2018-07-08 NOTE — Telephone Encounter (Signed)
Please advise on refill.

## 2018-08-18 MED FILL — glipiZIDE 5 MG TABS: 5 | 90 days supply | Qty: 270 | Fill #1

## 2018-08-18 MED FILL — INVOKANA 300 MG TABLET: 300 | 90 days supply | Qty: 90 | Fill #1

## 2018-09-22 MED FILL — LOSARTAN POTASSIUM 50 MG TA: 50 | 90 days supply | Qty: 90 | Fill #0

## 2018-09-22 MED FILL — metFORMIN HCL ER 500 MG TB2: 500 | 90 days supply | Qty: 360 | Fill #0

## 2018-10-26 DIAGNOSIS — R0789 Other chest pain: Secondary | ICD-10-CM | POA: Diagnosis not present

## 2018-12-01 ENCOUNTER — Encounter: Payer: 59 | Admitting: Internal Medicine

## 2018-12-13 DIAGNOSIS — L700 Acne vulgaris: Secondary | ICD-10-CM | POA: Diagnosis not present

## 2018-12-13 MED FILL — MINOCYCLINE HCL 100 MG CAPS: 100 | 30 days supply | Qty: 60 | Fill #0

## 2018-12-13 MED FILL — metroNIDAZOLE 0.75 % CREA: 0.75 | 30 days supply | Qty: 45 | Fill #0

## 2018-12-15 ENCOUNTER — Encounter: Payer: 59 | Admitting: Internal Medicine

## 2018-12-21 MED FILL — SIMVASTATIN 20 MG TABLET: 20 | 90 days supply | Qty: 90 | Fill #0

## 2018-12-21 MED FILL — METFORMIN HCL ER 500 MG TB2: 500 | 30 days supply | Qty: 120 | Fill #1

## 2018-12-29 ENCOUNTER — Encounter: Payer: Self-pay | Admitting: Internal Medicine

## 2018-12-29 ENCOUNTER — Other Ambulatory Visit: Payer: Self-pay | Admitting: Internal Medicine

## 2018-12-29 MED ORDER — JARDIANCE 25 MG PO TABS: 25.0000 mg | ORAL_TABLET | Freq: Every day | ORAL | 5 refills | Status: DC

## 2018-12-29 MED FILL — JARDIANCE 25 MG TABLET: 25 | 30 days supply | Qty: 30 | Fill #0

## 2019-01-23 ENCOUNTER — Other Ambulatory Visit: Payer: Self-pay | Admitting: Internal Medicine

## 2019-01-23 MED FILL — glipiZIDE 5 MG TABS: 5 | 90 days supply | Qty: 270 | Fill #0

## 2019-01-23 MED FILL — JARDIANCE 25 MG TABLET: 25 | 30 days supply | Qty: 30 | Fill #1

## 2019-01-23 MED FILL — metFORMIN HCL ER 500 MG TB2: 500 | 30 days supply | Qty: 120 | Fill #2

## 2019-01-24 MED FILL — LOSARTAN POTASSIUM 50 MG TA: 50 | 30 days supply | Qty: 30 | Fill #0

## 2019-01-24 NOTE — Telephone Encounter (Signed)
Last office visit 02/25/18  Cancel/No-show 1  Future office visit scheduled? No

## 2019-01-24 NOTE — Telephone Encounter (Signed)
Ok, but needs appt within the next 3-4 mo.

## 2019-01-24 NOTE — Telephone Encounter (Signed)
RX sent with note to schedule a follow up.

## 2019-02-20 MED FILL — metFORMIN HCL ER 500 MG TB2: 500 | 30 days supply | Qty: 120 | Fill #3

## 2019-02-22 ENCOUNTER — Ambulatory Visit (INDEPENDENT_AMBULATORY_CARE_PROVIDER_SITE_OTHER): Payer: 59

## 2019-02-22 ENCOUNTER — Ambulatory Visit (INDEPENDENT_AMBULATORY_CARE_PROVIDER_SITE_OTHER): Payer: 59 | Admitting: Orthopedic Surgery

## 2019-02-22 ENCOUNTER — Encounter: Payer: Self-pay | Admitting: Orthopedic Surgery

## 2019-02-22 DIAGNOSIS — M1712 Unilateral primary osteoarthritis, left knee: Secondary | ICD-10-CM | POA: Diagnosis not present

## 2019-02-22 DIAGNOSIS — M25562 Pain in left knee: Secondary | ICD-10-CM | POA: Diagnosis not present

## 2019-02-22 MED ORDER — BUPIVACAINE HCL 0.25 % IJ SOLN
4.0000 mL | INTRAMUSCULAR | Status: AC | PRN
  Administered 2019-02-22: 4 mL via INTRA_ARTICULAR

## 2019-02-22 MED ORDER — LIDOCAINE HCL 1 % IJ SOLN
5.0000 mL | INTRAMUSCULAR | Status: AC | PRN
  Administered 2019-02-22: 5 mL

## 2019-02-22 MED ORDER — METHYLPREDNISOLONE ACETATE 40 MG/ML IJ SUSP
40.0000 mg | INTRAMUSCULAR | Status: AC | PRN
  Administered 2019-02-22: 40 mg via INTRA_ARTICULAR

## 2019-02-22 NOTE — Progress Notes (Signed)
Office Visit Note   Patient: Angela Casey           Date of Birth: 08-27-1960           MRN: [id number] Visit Date: 02/22/2019 Requested by: Cori Razor, MD [place],  [place] [id number] PCP: Cori Razor, MD  Subjective: Chief Complaint  Patient presents with  . Left Knee - Pain    HPI: Angela Casey is a patient with left knee pain.  She is unsure of her injury.  She reports pain for the last 8 weeks.  Describes swelling and popping.  Weakness and giving way is also present.  Localizes the pain discretely to the medial aspect of the knee.  She had previous arthroscopy 3 years ago and did well with that.  She does describe an episode where the knee was stiff and it popped and then it felt better but then the pain worsened within the next 3 to 4 weeks.  Patient does report pain going up and down stairs as well as night pain.  She tried a home exercise program.  Takes Advil for her symptoms.              ROS: All systems reviewed are negative as they relate to the chief complaint within the history of present illness.  Patient denies  fevers or chills.   Assessment & Plan: Visit Diagnoses:  1. Left knee pain, unspecified chronicity   2. Arthritis of left knee     Plan: Impression is left knee pain with medial compartment arthritis.  I think she may have a meniscal root avulsion on that medial side but she does not have effusion today.  Collateral crucial ligaments are stable.  The real question is whether or not she has something that is arthroscopically treatable or is this just an exacerbation of arthritis that we will have to manage until she is ready for partial versus complete knee replacement.  Plan at this time is aspiration injection of the left knee with MRI scan of the left knee to evaluate for medial compartment OA versus medial meniscal tear of the root attachment.  I will call her with those results she lives in [place].  Follow-Up  Instructions: Return for after MRI.   Orders:  Orders Placed This Encounter  Procedures  . XR Knee 1-2 Views Left  . MR Knee Left w/o contrast   No orders of the defined types were placed in this encounter.     Procedures: Large Joint Inj: L knee on 02/22/2019 12:17 PM Indications: diagnostic evaluation, joint swelling and pain Details: 18 G 1.5 in needle, superolateral approach  Arthrogram: No  Medications: 5 mL lidocaine 1 %; 40 mg methylPREDNISolone acetate 40 MG/ML; 4 mL bupivacaine 0.25 % Outcome: tolerated well, no immediate complications Procedure, treatment alternatives, risks and benefits explained, specific risks discussed. Consent was given by the patient. Immediately prior to procedure a time out was called to verify the correct patient, procedure, equipment, support staff and site/side marked as required. Patient was prepped and draped in the usual sterile fashion.       Clinical Data: No additional findings.  Objective: Vital Signs: There were no vitals taken for this visit.  Physical Exam:   Constitutional: Patient appears well-developed HEENT:  Head: Normocephalic Eyes:EOM are normal Neck: Normal range of motion Cardiovascular: Normal rate Pulmonary/chest: Effort normal Neurologic: Patient is alert Skin: Skin is warm Psychiatric: Patient has normal mood and affect  Ortho Exam: Ortho exam demonstrates full active and passive range of motion of that left knee with trace effusion.  Has medial greater than lateral joint line tenderness.  Collateral cruciate ligaments are stable.  No masses lymphadenopathy or skin changes noted in that left knee region.  Extensor mechanism is intact McMurray compression testing positive  Specialty Comments:  No specialty comments available.  Imaging: Xr Knee 1-2 Views Left  Result Date: 02/22/2019 AP lateral left knee reviewed.  Medial joint space narrowing is noted.  Lateral joint space appears preserved.  Slight  varus alignment present.  No acute fracture or dislocation.  Medial compartment arthritis worse in the right knee on the AP view.    PMFS History: Patient Active Problem List   Diagnosis Date Noted  . Obesity (BMI 30-39.9) 02/25/2018  . Chronic urticaria 08/06/2016  . Perennial allergic rhinitis 08/06/2016  . Intrinsic atopic dermatitis 08/06/2016  . Hyperlipidemia 02/13/2016  . RAD (reactive airway disease) 07/02/2013  . BMI 40.0-44.9, adult ([facility]) 07/02/2013  . Type 2 diabetes mellitus, uncontrolled ([facility]) 06/29/2013   Past Medical History:  Diagnosis Date  . Allergy   . Arthritis    knees  . Asthma    asthmatic bronchitis  . Diabetes mellitus without complication ([facility])   . GERD (gastroesophageal reflux disease)   . Hypercholesteremia     Family History  Problem Relation Age of Onset  . Cancer Mother        uterine  . Diabetes Father   . COPD Father   . Asthma Father   . Diabetes Paternal Grandfather   . Breast cancer Paternal Grandmother        in 70's  . Allergic rhinitis Neg Hx   . Angioedema Neg Hx   . Atopy Neg Hx   . Eczema Neg Hx   . Immunodeficiency Neg Hx   . Urticaria Neg Hx     Past Surgical History:  Procedure Laterality Date  . ANTERIOR CRUCIATE LIGAMENT REPAIR Right   . BACK SURGERY     L5-S1  . CARPAL TUNNEL RELEASE    . KNEE ARTHROSCOPY Left 06/28/2015   Procedure: Left Knee Arthroscopy and Debridement;  Surgeon: Meredith Pel, MD;  Location: [facility];  Service: Orthopedics;  Laterality: Left;  . KNEE ARTHROSCOPY WITH MEDIAL MENISECTOMY Left 06/28/2015   Procedure: KNEE ARTHROSCOPY WITH MEDIAL MENISECTOMY;  Surgeon: Meredith Pel, MD;  Location: [facility];  Service: Orthopedics;  Laterality: Left;  . KNEE SURGERY     Social History   Occupational History  . Not on file  Tobacco Use  . Smoking status: Never Smoker  . Smokeless tobacco: Never Used  Substance and Sexual Activity  . Alcohol use: Yes     Alcohol/week: 0.0 standard drinks    Comment: social  . Drug use: No  . Sexual activity: Never    Birth control/protection: Abstinence

## 2019-02-26 ENCOUNTER — Other Ambulatory Visit: Payer: Self-pay

## 2019-02-26 ENCOUNTER — Ambulatory Visit (INDEPENDENT_AMBULATORY_CARE_PROVIDER_SITE_OTHER): Payer: 59

## 2019-02-26 DIAGNOSIS — M25762 Osteophyte, left knee: Secondary | ICD-10-CM | POA: Diagnosis not present

## 2019-02-26 DIAGNOSIS — M25562 Pain in left knee: Secondary | ICD-10-CM | POA: Diagnosis not present

## 2019-03-02 ENCOUNTER — Telehealth: Payer: Self-pay | Admitting: Orthopedic Surgery

## 2019-03-02 NOTE — Telephone Encounter (Signed)
S/w patient. She will think about it and call us back when ready to schedule.

## 2019-03-02 NOTE — Telephone Encounter (Signed)
Patient called to see what her next steps would be since receiving her MRI results on MyChart.  CB#[phone].  Thank you.

## 2019-03-02 NOTE — Telephone Encounter (Signed)
I looked at her scan.  She has a Baker's cyst which is wrapping around the front of her knee.  We could aspirate that under ultrasound guidance and that might help some.  Otherwise she has a lot of arthritis in the medial and patellofemoral compartment.  She will have to decide how much pain she is having and whether or not it warrants major intervention.  I do not think arthroscopy would necessarily be predictably beneficial for her.  If you can pass it onto her and see what she wants to do.  Getting the knee aspirated and injected potentially again as well as getting that cyst aspirated may help but there are no easy answers on this 1

## 2019-03-02 NOTE — Telephone Encounter (Signed)
Please advise. Thanks.  

## 2019-03-17 ENCOUNTER — Telehealth: Payer: Self-pay | Admitting: Internal Medicine

## 2019-03-17 DIAGNOSIS — E1165 Type 2 diabetes mellitus with hyperglycemia: Secondary | ICD-10-CM

## 2019-03-17 DIAGNOSIS — E782 Mixed hyperlipidemia: Secondary | ICD-10-CM

## 2019-03-17 DIAGNOSIS — E669 Obesity, unspecified: Secondary | ICD-10-CM

## 2019-03-17 NOTE — Telephone Encounter (Signed)
Patient is has a very hard work schedule currently and is wanting to do a virtual visit on October 14th. Would it be ok for to get her labs done early?  Please Advise, Thanks

## 2019-03-17 NOTE — Telephone Encounter (Signed)
Okay.  We can do that.  We will need an HbA1c and, depending on what work-up her PCP has done: CMP with GFR, lipid panel, and microalbumin to creatinine ratio-if they were not done in the last year.  Can you please order these?

## 2019-03-20 NOTE — Telephone Encounter (Signed)
I would prefer to see her on the 14th and we can get the labs afterwards, if she agrees.

## 2019-03-20 NOTE — Telephone Encounter (Signed)
All labs ordered.  I spoke with patient and let her know they have been ordered, I noticed that her virtual appt is 10/14 but she is not having her labs done until 10/20.  I tried to move her virtual visit for after she has the labs but I need to know if ok to use a new patient slot as there are no other ones open.

## 2019-03-21 NOTE — Telephone Encounter (Signed)
Noted  Patient agrees

## 2019-03-22 ENCOUNTER — Other Ambulatory Visit: Payer: Self-pay

## 2019-03-22 ENCOUNTER — Encounter: Payer: Self-pay | Admitting: Internal Medicine

## 2019-03-22 ENCOUNTER — Ambulatory Visit (INDEPENDENT_AMBULATORY_CARE_PROVIDER_SITE_OTHER): Payer: 59 | Admitting: Internal Medicine

## 2019-03-22 DIAGNOSIS — E1165 Type 2 diabetes mellitus with hyperglycemia: Secondary | ICD-10-CM

## 2019-03-22 DIAGNOSIS — E669 Obesity, unspecified: Secondary | ICD-10-CM | POA: Diagnosis not present

## 2019-03-22 DIAGNOSIS — E782 Mixed hyperlipidemia: Secondary | ICD-10-CM

## 2019-03-22 MED ORDER — TRULICITY 0.75 MG/0.5ML ~~LOC~~ SOAJ: SUBCUTANEOUS | 2 refills | Status: DC

## 2019-03-22 MED FILL — TRULICITY 0.75 MG/0.5 ML PE: 0.75 | 28 days supply | Qty: 2 | Fill #0

## 2019-03-22 NOTE — Patient Instructions (Addendum)
Please continue: Metformin XR 2000 mg at bedtime Jardiance 25 mg daily in am Glipizide 5 mg before b'fast and 10 mg before dinner  Please start Trulicity [id number] mg weekly. Let me know when you are close to running out to call in the higher dose to your pharmacy (1.5 mg)  Please return in 3-4 months with your sugar log.

## 2019-03-22 NOTE — Progress Notes (Addendum)
Patient ID: Angela Casey, female   DOB: [birth year], 58 y.o.   MRN: [id number]  Patient location: Home My location: Office  Referring Provider: Cori Razor, MD  I connected with the patient on 03/22/19 at  8:28 AM EDT by a video enabled telemedicine application and verified that I am speaking with the correct person.   I discussed the limitations of evaluation and management by telemedicine and the availability of in person appointments. The patient expressed understanding and agreed to proceed.   Details of the encounter are shown below.  HPI: Angela Casey is a 58 y.o.-year-old female, initially referred by her PCP, Dr. Wilhemena Durie, for management of DM2, dx 2010, previously non-insulin-dependent, uncontrolled, without long term complications. Last visit 1 year ago  At last visit, HbA1c improved after switching to a plant-based diet.  She also lost 16 pounds. She gained 8 lbs.  She has knee pain. She will have TKR in 06/2019. Had a steroid inj 02/21/2019.  Last hemoglobin A1c was: Lab Results  Component Value Date   HGBA1C 7.4 (A) 02/25/2018   HGBA1C 8.6 08/06/2017   HGBA1C 8.0 10/06/2016   HGBA1C 9.2% 05/27/2016   HGBA1C 8.4 02/13/2016   HGBA1C 7.4 07/23/2015   HGBA1C 8.5 04/24/2015   HGBA1C 8.9 (H) 08/01/2014   HGBA1C 9.7 (H) 03/08/2014   HGBA1C 9.1 (H) 12/05/2013  - 09/25/2013: HbA1c 8.3% - 12/27/2012: HbA1c 9.2% - previously: HbA1c 8.4%  Pt is on a regimen of: Metformin XR 2000 mg at bedtime Invokana 300 mg daily in am >> Jardiance 25 mg in am Glipizide 5 mg before b'fast and 10 mg before dinner She did not start insulin as suggested in 08/2017. She tried Januvia - started 07/2014 >> constipation (Mg did not help) >> try to stop and restart >> sxs restarted >> had same sxs >> she stopped 05/2015. She stopped Victoza 1.8 mg daily >> developed N/C/AP  Pt checks her sugars 2-3 times a day: - am:130-160s >> 129-179 (ave 140-150), 183 >> 123, 139-172, 183, 202, 204  (steroids) - 2h after b'fast:131 >> 101-151 >> 123 >> n/c  - before lunch:  71, 88, 90, 237 >> 103 >> 152 >> 146 - 2h after lunch: 146 >> 102, 228 >> n/c - before dinner:  157, 161, 209 >> 127, 139, 155 >> 145 - 2h after dinner: n/c >> 91-196 >> n/c - bedtime:109, 176, 274, 316 >> 189 >> n/c >> 195, 234 - nighttime: n/c Lowest sugar was 70 >> 103 >> 85 >> 123; she has hypoglycemia awareness in the 80s. Highest sugar was 316 >> 209 >> 256 (11 pm) >> 234.  She was taking nutrition classes before[facility]  -No CKD, last BUN/creatinine:  Lab Results  Component Value Date   BUN 19 10/06/2016   Lab Results  Component Value Date   CREATININE 0.72 10/06/2016  On losartan. -+ HL; lipid panel: Lab Results  Component Value Date   CHOL 135 10/06/2016   HDL 36.40 (L) 10/06/2016   LDLCALC 67 10/06/2016   TRIG 160.0 (H) 10/06/2016   CHOLHDL 4 10/06/2016  She had a rash develop.  Currently on simvastatin. - last eye exam was in 04/2018: No DR. Coming up next week. - no numbness and tingling in her feet. + R foot pain. H/o steroid inj's.  ROS: Constitutional: + weight gain (8 lbs)/no weight loss, no fatigue, no subjective hyperthermia, no subjective hypothermia Eyes: no blurry vision, no xerophthalmia ENT: no sore  throat, no nodules palpated in neck, no dysphagia, no odynophagia, no hoarseness Cardiovascular: no CP/no SOB/no palpitations/no leg swelling Respiratory: no cough/no SOB/no wheezing Gastrointestinal: no N/no V/no D/no C/no acid reflux Musculoskeletal: no muscle aches/+ joint aches Skin: no rashes, no hair loss Neurological: no tremors/no numbness/no tingling/no dizziness  I reviewed pt's medications, allergies, PMH, social hx, family hx, and changes were documented in the history of present illness. Otherwise, unchanged from my initial visit note.  Past Medical History:  Diagnosis Date  . Allergy   . Arthritis    knees  . Asthma    asthmatic bronchitis   . Diabetes mellitus without complication ([place])   . GERD (gastroesophageal reflux disease)   . Hypercholesteremia    Past Surgical History:  Procedure Laterality Date  . ANTERIOR CRUCIATE LIGAMENT REPAIR Right   . BACK SURGERY     L5-S1  . CARPAL TUNNEL RELEASE    . KNEE ARTHROSCOPY Left 06/28/2015   Procedure: Left Knee Arthroscopy and Debridement;  Surgeon: Meredith Pel, MD;  Location: [facility];  Service: Orthopedics;  Laterality: Left;  . KNEE ARTHROSCOPY WITH MEDIAL MENISECTOMY Left 06/28/2015   Procedure: KNEE ARTHROSCOPY WITH MEDIAL MENISECTOMY;  Surgeon: Meredith Pel, MD;  Location: [facility];  Service: Orthopedics;  Laterality: Left;  . KNEE SURGERY     Social History   Socioeconomic History  . Marital status: Divorced    Spouse name: Not on file  . Number of children: Not on file  . Years of education: Not on file  . Highest education level: Not on file  Occupational History  . Not on file  Social Needs  . Financial resource strain: Not on file  . Food insecurity    Worry: Not on file    Inability: Not on file  . Transportation needs    Medical: Not on file    Non-medical: Not on file  Tobacco Use  . Smoking status: Never Smoker  . Smokeless tobacco: Never Used  Substance and Sexual Activity  . Alcohol use: Yes    Alcohol/week: 0.0 standard drinks    Comment: social  . Drug use: No  . Sexual activity: Never    Birth control/protection: Abstinence  Lifestyle  . Physical activity    Days per week: Not on file    Minutes per session: Not on file  . Stress: Not on file  Relationships  . Social Herbalist on phone: Not on file    Gets together: Not on file    Attends religious service: Not on file    Active member of club or organization: Not on file    Attends meetings of clubs or organizations: Not on file    Relationship status: Not on file  . Intimate partner violence    Fear of current or ex  partner: Not on file    Emotionally abused: Not on file    Physically abused: Not on file    Forced sexual activity: Not on file  Other Topics Concern  . Not on file  Social History Narrative  . Not on file   Current Outpatient Medications on File Prior to Visit  Medication Sig Dispense Refill  . ACCU-CHEK FASTCLIX LANCETS MISC Use to check sugar 3 times daily 300 each 5  . ACCU-CHEK GUIDE test strip USE AS INSTRUCTED TO CHECK SUGAR 3 TIMES DAILY 300 each 5  . Albuterol Sulfate (PROAIR RESPICLICK) 096 (90 Base) MCG/ACT AEPB Inhale 2  puffs into the lungs every 4 (four) hours as needed (shortness of breath). 1 each 2  . benzonatate (TESSALON PERLES) 100 MG capsule Take 1-2 capsules (100-200 mg total) by mouth every 8 (eight) hours as needed for cough. 30 capsule 0  . Blood Glucose Monitoring Suppl (ACCU-CHEK GUIDE) w/Device KIT 1 Device by Does not apply route daily. 1 kit 0  . Dupilumab (DUPIXENT) 300 MG/2ML SOSY Inject 300 mg into the skin every 14 (fourteen) days. 4 mL 10  . empagliflozin (JARDIANCE) 25 MG TABS tablet Take 25 mg by mouth daily before breakfast. 30 tablet 5  . glipiZIDE (GLUCOTROL) 5 MG tablet TAKE 1 TABLET BEFORE BREAKFAST AND 2 TABLETS BEFORE DINNER. 270 tablet 3  . ibuprofen (ADVIL,MOTRIN) 600 MG tablet Take 600 mg by mouth every 6 (six) hours as needed.    . loratadine (CLARITIN) 10 MG tablet Take by mouth.    . losartan (COZAAR) 50 MG tablet Take 1 tablet (50 mg total) by mouth daily. NEED appointment for refills 90 tablet 0  . metFORMIN (GLUCOPHAGE-XR) 500 MG 24 hr tablet Take 2 tablets (1,000 mg total) by mouth 2 (two) times daily. 360 tablet 3  . metroNIDAZOLE (METROGEL) 1 % gel Apply topically daily. During flares. 45 g 0  . omeprazole (PRILOSEC) 40 MG capsule   11  . predniSONE (DELTASONE) 5 MG tablet Take 1 tablet (5 mg total) by mouth as directed. Taper 6,5,4,3,2,1 21 tablet 0  . simvastatin (ZOCOR) 20 MG tablet TAKE 1 TABLET BY MOUTH DAILY. 90 tablet PRN    Current Facility-Administered Medications on File Prior to Visit  Medication Dose Route Frequency Provider Last Rate Last Dose  . albuterol (PROVENTIL) (2.5 MG/3ML) 0.083% nebulizer solution 2.5 mg  2.5 mg Nebulization Once Wendie Agreste, MD      . betamethasone acetate-betamethasone sodium phosphate (CELESTONE) injection 3 mg  3 mg Intramuscular Once Daylene Katayama M, DPM      . betamethasone acetate-betamethasone sodium phosphate (CELESTONE) injection 3 mg  3 mg Intramuscular Once Daylene Katayama M, DPM      . betamethasone acetate-betamethasone sodium phosphate (CELESTONE) injection 3 mg  3 mg Intramuscular Once Daylene Katayama M, DPM      . ipratropium (ATROVENT) nebulizer solution 0.5 mg  0.5 mg Nebulization Once Wendie Agreste, MD       Allergies  Allergen Reactions  . Livalo [Pitavastatin] Itching, Rash and Hives  . Exenatide Nausea And Vomiting    diarrhea  . Pollen Extract     Other reaction(s): sneezing   Family History  Problem Relation Age of Onset  . Cancer Mother        uterine  . Diabetes Father   . COPD Father   . Asthma Father   . Diabetes Paternal Grandfather   . Breast cancer Paternal Grandmother        in 2's  . Allergic rhinitis Neg Hx   . Angioedema Neg Hx   . Atopy Neg Hx   . Eczema Neg Hx   . Immunodeficiency Neg Hx   . Urticaria Neg Hx     PE: There were no vitals taken for this visit. There is no height or weight on file to calculate BMI.  Wt Readings from Last 3 Encounters:  02/25/18 217 lb (98.4 kg)  08/06/17 233 lb (105.7 kg)  10/06/16 220 lb (99.8 kg)   Constitutional:  in NAD  The physical exam was not performed (virtual visit).  ASSESSMENT: 1. DM2, non-insulin-dependent, uncontrolled, with  hyperglycemia   - In 05/2016 I suggested a weekly GLP1R agonist but she wanted to try diet and exercise first  2. HL  3. Obesity  PLAN:  1. Patient with history of poorly controlled diabetes, with better control at last visit after switching  to a more plant-based diet.  At that time, sugars were not quite at goal but definitely improved compared to before.  In the past, states sugars were high, I suggested insulin and she tried this for a period of time but sugars did not improve significantly. -We did not change her regimen at last visit.  She continues on Metformin ER, sulfonylurea, and SGLT 2 inhibitor. - latest HbA1c was much better, at 7.4% -However, sugars have now higher than before (possible contributors current steroid injections, inactivity due to coronavirus pandemic, changing from Invokana to [place]) and I do feel that she needs intensification of treatment.  I suggested a weekly GLP-1 receptor agonist.  She agrees to try Trulicity.  She did have constipation from Victoza in the past but we discussed that these side effects are not necessarily encountered for all the drugs in the same class -I advised her to let me know if she tolerates well the low dose of Trulicity so we can increase the dose to 1.5 mg daily in a month - I suggested: Patient Instructions  Please continue: Metformin XR 2000 mg at bedtime Jardiance 25 mg daily in am Glipizide 5 mg before b'fast and 10 mg before dinner  Please start Trulicity 3.33 mg weekly. Let me know when you are close to running out to call in the higher dose to your pharmacy (1.5 mg)  Please return in 3-4 months with your sugar log.  - we would check her HbA1c when she returns to the clinic-labs are in - advised to check sugars at different times of the day - 1x a day, rotating check times - advised for yearly eye exams >> she is UTD and has another one coming up - return to clinic in 3-4 months    2. HL  -Reviewed latest lipid panel from 2018: HDL low, LDL at goal, triglycerides slightly high  Lab Results  Component Value Date   CHOL 135 10/06/2016   HDL 36.40 (L) 10/06/2016   LDLCALC 67 10/06/2016   TRIG 160.0 (H) 10/06/2016   CHOLHDL 4 10/06/2016  - continue  Simvastatin w/o SEs - I do not have the latest lipid panel but she will have one checked in the next few days  3. Obesity - at last visit, she lost 16 lbs in the preceding 6 mo on a plant based diet - I strongly advised her to continue -She has now gained 8 pounds. - continue Jardiance which should also help with wt loss and we will try to add Trulicity, which is also help  Component     Latest Ref Rng & Units 03/28/2019  Sodium     135 - 145 mEq/L 140  Potassium     3.5 - 5.1 mEq/L 4.5  Chloride     96 - 112 mEq/L 103  CO2     19 - 32 mEq/L 28  Glucose     70 - 99 mg/dL 222 (H)  BUN     6 - 23 mg/dL 15  Creatinine     0.40 - 1.20 mg/dL 0.78  Total Bilirubin     0.2 - 1.2 mg/dL 0.3  Alkaline Phosphatase     39 - 117 U/L 78  AST     0 - 37 U/L 22  ALT     0 - 35 U/L 31  Total Protein     6.0 - 8.3 g/dL 7.2  Albumin     3.5 - 5.2 g/dL 4.2  Calcium     8.4 - 10.5 mg/dL 9.4  GFR     >60.00 mL/min 76.14  Cholesterol, Total     100 - 199 mg/dL 108  Triglycerides     0 - 149 mg/dL 137  HDL Cholesterol     >39 mg/dL 32 (L)  VLDL Cholesterol Cal     5 - 40 mg/dL 24  LDL Chol Calc (NIH)     0 - 99 mg/dL 52  Hemoglobin A1C     4.6 - 6.5 % 9.2 (H)   HbA1c higher. LDL at goal, triglycerides mostly at goal, HDL low.   The rest of the labs are normal with exception of a high glucose. I am hoping she can tolerate Trulicity well.  Philemon Kingdom, MD PhD [facility]

## 2019-03-28 ENCOUNTER — Other Ambulatory Visit (INDEPENDENT_AMBULATORY_CARE_PROVIDER_SITE_OTHER): Payer: 59

## 2019-03-28 ENCOUNTER — Other Ambulatory Visit: Payer: Self-pay | Admitting: Internal Medicine

## 2019-03-28 ENCOUNTER — Other Ambulatory Visit: Payer: Self-pay

## 2019-03-28 DIAGNOSIS — E1165 Type 2 diabetes mellitus with hyperglycemia: Secondary | ICD-10-CM | POA: Diagnosis not present

## 2019-03-28 DIAGNOSIS — E119 Type 2 diabetes mellitus without complications: Secondary | ICD-10-CM | POA: Diagnosis not present

## 2019-03-28 DIAGNOSIS — E782 Mixed hyperlipidemia: Secondary | ICD-10-CM | POA: Diagnosis not present

## 2019-03-28 DIAGNOSIS — E669 Obesity, unspecified: Secondary | ICD-10-CM

## 2019-03-28 DIAGNOSIS — H40013 Open angle with borderline findings, low risk, bilateral: Secondary | ICD-10-CM | POA: Diagnosis not present

## 2019-03-28 DIAGNOSIS — H04123 Dry eye syndrome of bilateral lacrimal glands: Secondary | ICD-10-CM | POA: Diagnosis not present

## 2019-03-28 LAB — HEMOGLOBIN A1C: Hgb A1c MFr Bld: 9.2 % — ABNORMAL HIGH (ref 4.6–6.5)

## 2019-03-28 LAB — COMPREHENSIVE METABOLIC PANEL
ALT: 31 U/L (ref 0–35)
AST: 22 U/L (ref 0–37)
Albumin: 4.2 g/dL (ref 3.5–5.2)
Alkaline Phosphatase: 78 U/L (ref 39–117)
BUN: 15 mg/dL (ref 6–23)
CO2: 28 mEq/L (ref 19–32)
Calcium: 9.4 mg/dL (ref 8.4–10.5)
Chloride: 103 mEq/L (ref 96–112)
Creatinine, Ser: 0.78 mg/dL (ref 0.40–1.20)
GFR: 76.14 mL/min (ref >60.00)
Glucose, Bld: 222 mg/dL — ABNORMAL HIGH (ref 70–99)
Potassium: 4.5 mEq/L (ref 3.5–5.1)
Sodium: 140 mEq/L (ref 135–145)
Total Bilirubin: 0.3 mg/dL (ref 0.2–1.2)
Total Protein: 7.2 g/dL (ref 6.0–8.3)

## 2019-03-28 MED FILL — metFORMIN HCL ER 500 MG TB2: 500 | 30 days supply | Qty: 120 | Fill #0

## 2019-03-29 ENCOUNTER — Encounter: Payer: Self-pay | Admitting: Internal Medicine

## 2019-03-29 LAB — LIPID PANEL W/O CHOL/HDL RATIO
Cholesterol, Total: 108 mg/dL (ref 100–199)
HDL: 32 mg/dL — ABNORMAL LOW (ref >39)
LDL Chol Calc (NIH): 52 mg/dL (ref 0–99)
Triglycerides: 137 mg/dL (ref 0–149)
VLDL Cholesterol Cal: 24 mg/dL (ref 5–40)

## 2019-03-30 ENCOUNTER — Other Ambulatory Visit: Payer: Self-pay | Admitting: Internal Medicine

## 2019-03-30 MED FILL — ACCU-CHEK GUIDE TEST STRIP: 67 days supply | Qty: 200 | Fill #0

## 2019-04-08 ENCOUNTER — Encounter: Payer: Self-pay | Admitting: Internal Medicine

## 2019-04-19 ENCOUNTER — Other Ambulatory Visit: Payer: Self-pay | Admitting: Internal Medicine

## 2019-04-19 MED FILL — JARDIANCE 25 MG TABLET: 25 | 30 days supply | Qty: 30 | Fill #3

## 2019-04-19 MED FILL — metFORMIN HCL ER 500 MG TB2: 500 | 30 days supply | Qty: 120 | Fill #1

## 2019-04-19 MED FILL — glipiZIDE 5 MG TABS: 5 | 90 days supply | Qty: 270 | Fill #0

## 2019-04-19 MED FILL — LOSARTAN POTASSIUM 50 MG TA: 50 | 30 days supply | Qty: 30 | Fill #2

## 2019-04-19 MED FILL — SIMVASTATIN 20 MG TABLET: 20 | 90 days supply | Qty: 90 | Fill #1

## 2019-04-21 DIAGNOSIS — Z20828 Contact with and (suspected) exposure to other viral communicable diseases: Secondary | ICD-10-CM | POA: Diagnosis not present

## 2019-05-15 DIAGNOSIS — H5213 Myopia, bilateral: Secondary | ICD-10-CM | POA: Diagnosis not present

## 2019-05-15 DIAGNOSIS — H52211 Irregular astigmatism, right eye: Secondary | ICD-10-CM | POA: Diagnosis not present

## 2019-05-24 DIAGNOSIS — Z1231 Encounter for screening mammogram for malignant neoplasm of breast: Secondary | ICD-10-CM | POA: Diagnosis not present

## 2019-05-26 ENCOUNTER — Other Ambulatory Visit: Payer: Self-pay | Admitting: Internal Medicine

## 2019-05-26 MED FILL — JARDIANCE 25 MG TABLET: 25 | 30 days supply | Qty: 30 | Fill #4

## 2019-05-26 MED FILL — metFORMIN HCL ER 500 MG TB2: 500 | 90 days supply | Qty: 360 | Fill #0

## 2019-05-26 MED FILL — LOSARTAN POTASSIUM 50 MG TA: 50 | 90 days supply | Qty: 90 | Fill #0

## 2019-05-29 DIAGNOSIS — Z6841 Body Mass Index (BMI) 40.0 and over, adult: Secondary | ICD-10-CM | POA: Diagnosis not present

## 2019-05-29 DIAGNOSIS — Z01419 Encounter for gynecological examination (general) (routine) without abnormal findings: Secondary | ICD-10-CM | POA: Diagnosis not present

## 2019-05-29 DIAGNOSIS — Z1151 Encounter for screening for human papillomavirus (HPV): Secondary | ICD-10-CM | POA: Diagnosis not present

## 2019-06-05 ENCOUNTER — Other Ambulatory Visit: Payer: Self-pay

## 2019-06-05 MED ORDER — FREESTYLE LANCETS MISC: 12 refills | Status: DC

## 2019-07-06 DIAGNOSIS — R922 Inconclusive mammogram: Secondary | ICD-10-CM | POA: Diagnosis not present

## 2019-07-06 DIAGNOSIS — R928 Other abnormal and inconclusive findings on diagnostic imaging of breast: Secondary | ICD-10-CM | POA: Diagnosis not present

## 2019-07-06 DIAGNOSIS — R921 Mammographic calcification found on diagnostic imaging of breast: Secondary | ICD-10-CM | POA: Diagnosis not present

## 2019-07-19 ENCOUNTER — Encounter: Payer: Self-pay | Admitting: Internal Medicine

## 2019-07-21 ENCOUNTER — Other Ambulatory Visit (INDEPENDENT_AMBULATORY_CARE_PROVIDER_SITE_OTHER): Payer: 59

## 2019-07-21 ENCOUNTER — Other Ambulatory Visit: Payer: Self-pay

## 2019-07-21 DIAGNOSIS — E1165 Type 2 diabetes mellitus with hyperglycemia: Secondary | ICD-10-CM

## 2019-07-21 LAB — HEMOGLOBIN A1C: Hgb A1c MFr Bld: 8.9 % — ABNORMAL HIGH (ref 4.6–6.5)

## 2019-08-29 ENCOUNTER — Encounter: Payer: Self-pay | Admitting: Internal Medicine

## 2019-08-30 ENCOUNTER — Other Ambulatory Visit: Payer: Self-pay | Admitting: Internal Medicine

## 2019-08-30 MED ORDER — METFORMIN HCL ER 500 MG PO TB24: 1000.0000 mg | ORAL_TABLET | Freq: Two times a day (BID) | ORAL | 1 refills | Status: DC

## 2019-08-30 MED ORDER — JARDIANCE 25 MG PO TABS: 25.0000 mg | ORAL_TABLET | Freq: Every day | ORAL | 5 refills | Status: DC

## 2019-08-30 MED ORDER — FREESTYLE LITE DEVI: 0 refills | Status: AC

## 2019-08-30 MED ORDER — FREESTYLE LITE TEST VI STRP: ORAL_STRIP | 12 refills | Status: DC

## 2019-08-30 MED ORDER — FREESTYLE LANCETS MISC: 12 refills | Status: AC

## 2019-08-30 MED FILL — FREESTYLE LANCETS: 90 days supply | Qty: 300 | Fill #0

## 2019-08-30 MED FILL — metFORMIN HCL ER 500 MG TB2: 500 | 90 days supply | Qty: 360 | Fill #0

## 2019-08-30 MED FILL — FREESTYLE LITE METER: 1 days supply | Qty: 1 | Fill #0

## 2019-08-30 MED FILL — FREESTYLE LITE TEST STRIP: 90 days supply | Qty: 300 | Fill #0

## 2019-08-30 MED FILL — JARDIANCE 25 MG TABLET: 25 | 30 days supply | Qty: 30 | Fill #0

## 2019-11-20 ENCOUNTER — Ambulatory Visit: Payer: 59 | Admitting: Internal Medicine

## 2019-11-20 ENCOUNTER — Encounter: Payer: Self-pay | Admitting: Internal Medicine

## 2019-11-20 ENCOUNTER — Other Ambulatory Visit: Payer: Self-pay | Admitting: Internal Medicine

## 2019-11-20 ENCOUNTER — Other Ambulatory Visit: Payer: Self-pay

## 2019-11-20 VITALS — BP 130/82 | HR 84 | Ht 63.5 in | Wt 195.0 lb

## 2019-11-20 DIAGNOSIS — E782 Mixed hyperlipidemia: Secondary | ICD-10-CM

## 2019-11-20 DIAGNOSIS — E669 Obesity, unspecified: Secondary | ICD-10-CM | POA: Diagnosis not present

## 2019-11-20 DIAGNOSIS — E1165 Type 2 diabetes mellitus with hyperglycemia: Secondary | ICD-10-CM | POA: Diagnosis not present

## 2019-11-20 LAB — POCT GLYCOSYLATED HEMOGLOBIN (HGB A1C): Hemoglobin A1C: 7.1 % — AB (ref 4.0–5.6)

## 2019-11-20 MED ORDER — GLIPIZIDE 5 MG PO TABS: ORAL_TABLET | ORAL | 3 refills | Status: DC

## 2019-11-20 MED ORDER — SIMVASTATIN 20 MG PO TABS: 20.0000 mg | ORAL_TABLET | Freq: Every day | ORAL | 3 refills | Status: DC

## 2019-11-20 MED ORDER — EMPAGLIFLOZIN 25 MG PO TABS: 25.0000 mg | ORAL_TABLET | Freq: Every day | ORAL | 3 refills | Status: DC

## 2019-11-20 MED ORDER — LOSARTAN POTASSIUM 50 MG PO TABS: 50.0000 mg | ORAL_TABLET | Freq: Every day | ORAL | 3 refills | Status: DC

## 2019-11-20 MED FILL — LOSARTAN POTASSIUM 50 MG TA: 50 | 90 days supply | Qty: 90 | Fill #0

## 2019-11-20 MED FILL — SIMVASTATIN 20 MG TABLET: 20 | 90 days supply | Qty: 90 | Fill #0

## 2019-11-20 NOTE — Addendum Note (Signed)
Addended by: Cardell Peach I on: 11/20/2019 03:06 PM   Modules accepted: Orders

## 2019-11-20 NOTE — Progress Notes (Signed)
Patient ID: Angela Casey, female   DOB: June 25, 1960, 59 y.o.   MRN: [id number]  This visit occurred during the SARS-CoV-2 public health emergency.  Safety protocols were in place, including screening questions prior to the visit, additional usage of staff PPE, and extensive cleaning of exam room while observing appropriate contact time as indicated for disinfecting solutions.   HPI: Angela Casey is a 59 y.o.-year-old female, initially referred by her PCP, Dr. Wilhemena Durie, for management of DM2, dx 2010, previously non-insulin-dependent, uncontrolled, without long term complications. Last visit 8 months ago (virtual).  Since last visit, around 06/2019, she started to change her diet. She eats smaller portions - mostly a low carb diet. No sweet tea.  She feels much better and she lost a significant amount of weight.  Sugars are also better.  Reviewed HbA1c levels: Lab Results  Component Value Date   HGBA1C 8.9 (H) 07/21/2019   HGBA1C 9.2 (H) 03/28/2019   HGBA1C 7.4 (A) 02/25/2018   HGBA1C 8.6 08/06/2017   HGBA1C 8.0 10/06/2016   HGBA1C 9.2% 05/27/2016   HGBA1C 8.4 02/13/2016   HGBA1C 7.4 07/23/2015   HGBA1C 8.5 04/24/2015   HGBA1C 8.9 (H) 08/01/2014  - 09/25/2013: HbA1c 8.3% - 12/27/2012: HbA1c 9.2% - previously: HbA1c 8.4%  Pt is on a regimen of: - Metformin XR 2000 mg at bedtime - >> stopped 1 mo ago - Glipizide 5 mg before b'fast and 10 >> 5 mg before dinner - - >> stopped b/c ofdiarrhea and nausea 03/2019 She did not start insulin as suggested in 08/2017. She tried Januvia - started 07/2014 >> constipation (Mg did not help) >> try to stop and restart >> sxs restarted >> had same sxs >> she stopped 05/2015. She stopped Victoza 1.8 mg daily >> developed N/C/AP  Pt checks her sugars 2-3x a day per review of her excellent log: - am: [phone] >> 123, 139-172, 183, 202, 204 (steroids) >> 129, 140-171 - 2h after b'fast:131 >> 101-151 >> 123 >> n/c  - before lunch:  71, 88, 90, 237  >> 103 >> 152 >> 146 >> 129-147 - 2h after lunch: 146 >> 102, 228 >> n/c >> 69, 88-139 - before dinner:  157, 161, 209 >> 127, 139, 155 >> 145 >> 89-134, 155 - 2h after dinner: n/c >> 91-196 >> n/c - bedtime:109, 176, 274, 316 >> 189 >> n/c >> 195, 234 >> 98, 127-175, 202, 222 - nighttime: n/c Lowest sugar was 70 >> 103 >> 85 >> 123 >> 89; she has hypoglycemia awareness in the 80s. Highest sugar was 316 >> 209 >> 256 (11 pm) >> 234 >> 222.  She was taking nutrition classes before[facility]  -No CKD, last BUN/creatinine:  Lab Results  Component Value Date   BUN 15 03/28/2019   Lab Results  Component Value Date   CREATININE 0.78 03/28/2019  On losartan. -+ HL; lipid panel: Lab Results  Component Value Date   CHOL 108 03/28/2019   HDL 32 (L) 03/28/2019   LDLCALC 52 03/28/2019   TRIG 137 03/28/2019   CHOLHDL 4 10/06/2016  She is on simvastatin - last eye exam was in 05/2019: No DR reportedly - no numbness and tingling in her feet. + R foot pain. H/o steroid inj's.  ROS: Constitutional: no weight gain/+ weight loss, no fatigue, no subjective hyperthermia, no subjective hypothermia Eyes: no blurry vision, no xerophthalmia ENT: no sore throat, + see HPI Cardiovascular: no CP/no SOB/no palpitations/no leg swelling Respiratory:  no cough/no SOB/no wheezing Gastrointestinal: no N/no V/no D/no C/no acid reflux Musculoskeletal: no muscle aches/no joint aches Skin: no rashes, no hair loss Neurological: no tremors/no numbness/no tingling/no dizziness  I reviewed pt's medications, allergies, PMH, social hx, family hx, and changes were documented in the history of present illness. Otherwise, unchanged from my initial visit note.  Past Medical History:  Diagnosis Date  . Allergy   . Arthritis    knees  . Asthma    asthmatic bronchitis  . Diabetes mellitus without complication ([place])   . GERD (gastroesophageal reflux disease)   . Hypercholesteremia    Past Surgical  History:  Procedure Laterality Date  . ANTERIOR CRUCIATE LIGAMENT REPAIR Right   . BACK SURGERY     L5-S1  . CARPAL TUNNEL RELEASE    . KNEE ARTHROSCOPY Left 06/28/2015   Procedure: Left Knee Arthroscopy and Debridement;  Surgeon: Meredith Pel, MD;  Location: [facility];  Service: Orthopedics;  Laterality: Left;  . KNEE ARTHROSCOPY WITH MEDIAL MENISECTOMY Left 06/28/2015   Procedure: KNEE ARTHROSCOPY WITH MEDIAL MENISECTOMY;  Surgeon: Meredith Pel, MD;  Location: [facility];  Service: Orthopedics;  Laterality: Left;  . KNEE SURGERY     Social History   Socioeconomic History  . Marital status: Divorced    Spouse name: Not on file  . Number of children: Not on file  . Years of education: Not on file  . Highest education level: Not on file  Occupational History  . Not on file  Tobacco Use  . Smoking status: Never Smoker  . Smokeless tobacco: Never Used  Vaping Use  . Vaping Use: Never used  Substance and Sexual Activity  . Alcohol use: Yes    Alcohol/week: 0.0 standard drinks    Comment: social  . Drug use: No  . Sexual activity: Never    Birth control/protection: Abstinence  Other Topics Concern  . Not on file  Social History Narrative  . Not on file   Social Determinants of Health   Financial Resource Strain:   . Difficulty of Paying Living Expenses:   Food Insecurity:   . Worried About Charity fundraiser in the Last Year:   . Arboriculturist in the Last Year:   Transportation Needs:   . Film/video editor (Medical):   Marland Kitchen Lack of Transportation (Non-Medical):   Physical Activity:   . Days of Exercise per Week:   . Minutes of Exercise per Session:   Stress:   . Feeling of Stress :   Social Connections:   . Frequency of Communication with Friends and Family:   . Frequency of Social Gatherings with Friends and Family:   . Attends Religious Services:   . Active Member of Clubs or Organizations:   . Attends Theatre manager Meetings:   Marland Kitchen Marital Status:   Intimate Partner Violence:   . Fear of Current or Ex-Partner:   . Emotionally Abused:   Marland Kitchen Physically Abused:   . Sexually Abused:    Current Outpatient Medications on File Prior to Visit  Medication Sig Dispense Refill  . Albuterol Sulfate (PROAIR RESPICLICK) 130 (90 Base) MCG/ACT AEPB Inhale 2 puffs into the lungs every 4 (four) hours as needed (shortness of breath). 1 each 2  . benzonatate (TESSALON PERLES) 100 MG capsule Take 1-2 capsules (100-200 mg total) by mouth every 8 (eight) hours as needed for cough. 30 capsule 0  . Blood Glucose Monitoring Suppl (FREESTYLE LITE)  DEVI Use to check blood sugar 3 times a day 1 each 0  . Dulaglutide (TRULICITY) 8.25 KN/3.9JQ SOPN Inject 0.75 mg in am weekly under skin 4 pen 2  . Dupilumab (DUPIXENT) 300 MG/2ML SOSY Inject 300 mg into the skin every 14 (fourteen) days. 4 mL 10  . empagliflozin (JARDIANCE) 25 MG TABS tablet Take 25 mg by mouth daily before breakfast. 30 tablet 5  . glipiZIDE (GLUCOTROL) 5 MG tablet TAKE 1 TABLET BY MOUTH BEFORE BREAKFAST AND 2 TABLETS BEFORE DINNER. 270 tablet 1  . glucose blood (FREESTYLE LITE) test strip Use to check blood sugar 3 times a day. 300 each 12  . ibuprofen (ADVIL,MOTRIN) 600 MG tablet Take 600 mg by mouth every 6 (six) hours as needed.    . Lancets (FREESTYLE) lancets Use to check blood sugar 3 times a day 300 each 12  . loratadine (CLARITIN) 10 MG tablet Take by mouth.    . losartan (COZAAR) 50 MG tablet Take 1 tablet (50 mg total) by mouth daily. 90 tablet 1  . metFORMIN (GLUCOPHAGE-XR) 500 MG 24 hr tablet Take 2 tablets (1,000 mg total) by mouth 2 (two) times daily. 360 tablet 1  . metroNIDAZOLE (METROGEL) 1 % gel Apply topically daily. During flares. 45 g 0  . omeprazole (PRILOSEC) 40 MG capsule   11  . predniSONE (DELTASONE) 5 MG tablet Take 1 tablet (5 mg total) by mouth as directed. Taper 6,5,4,3,2,1 21 tablet 0  . simvastatin (ZOCOR) 20 MG tablet TAKE  1 TABLET BY MOUTH DAILY. 90 tablet PRN   Current Facility-Administered Medications on File Prior to Visit  Medication Dose Route Frequency Provider Last Rate Last Admin  . albuterol (PROVENTIL) (2.5 MG/3ML) 0.083% nebulizer solution 2.5 mg  2.5 mg Nebulization Once Wendie Agreste, MD      . betamethasone acetate-betamethasone sodium phosphate (CELESTONE) injection 3 mg  3 mg Intramuscular Once Daylene Katayama M, DPM      . betamethasone acetate-betamethasone sodium phosphate (CELESTONE) injection 3 mg  3 mg Intramuscular Once Daylene Katayama M, DPM      . betamethasone acetate-betamethasone sodium phosphate (CELESTONE) injection 3 mg  3 mg Intramuscular Once Daylene Katayama M, DPM      . ipratropium (ATROVENT) nebulizer solution 0.5 mg  0.5 mg Nebulization Once Wendie Agreste, MD       Allergies  Allergen Reactions  . Livalo [Pitavastatin] Itching, Rash and Hives  . Exenatide Nausea And Vomiting    diarrhea  . Pollen Extract     Other reaction(s): sneezing   Family History  Problem Relation Age of Onset  . Cancer Mother        uterine  . Diabetes Father   . COPD Father   . Asthma Father   . Diabetes Paternal Grandfather   . Breast cancer Paternal Grandmother        in 57's  . Allergic rhinitis Neg Hx   . Angioedema Neg Hx   . Atopy Neg Hx   . Eczema Neg Hx   . Immunodeficiency Neg Hx   . Urticaria Neg Hx     PE: BP 130/82   Pulse 84   Ht 5' 3.5" (1.613 m)   Wt 195 lb (88.5 kg)   SpO2 98%   BMI 34.00 kg/m  Body mass index is 34 kg/m.  Wt Readings from Last 3 Encounters:  11/20/19 195 lb (88.5 kg)  02/25/18 217 lb (98.4 kg)  08/06/17 233 lb (105.7 kg)   Constitutional:  overweight, in NAD Eyes: PERRLA, EOMI, no exophthalmos ENT: moist mucous membranes, no thyromegaly, no cervical lymphadenopathy Cardiovascular: RRR, No MRG Respiratory: CTA B Gastrointestinal: abdomen soft, NT, ND, BS+ Musculoskeletal: no deformities, strength intact in all 4 Skin: moist, warm, no  rashes Neurological: no tremor with outstretched hands, DTR normal in all 4 extremities including  ASSESSMENT: 1. DM2, non-insulin-dependent, uncontrolled, with hyperglycemia   - In 05/2016 I suggested a weekly GLP1R agonist but she wanted to try diet and exercise first  2. HL  3. Obesity class 1  PLAN:  1. Patient with history of poorly controlled diabetes, with better control in the past after switching to a more plant-based diet.  She continues on a regimen consisting on Metformin, sulfonylurea, but stopped the SGLT 2 inhibitor since last visit.  At last visit we tried to add a GLP-1 receptor agonist, but she could not tolerate this due to nausea and diarrhea.  However, since then, she worked on her diet and she lost weight and her sugars improved. -Latest HbA1c was better, but still above target, at 8.9% on 07/2019. -At this visit, sugars are much better, although still high in the morning.  She has either dawn phenomenon or increased gluconeogenesis overnight despite taking Metformin at night.  However, she also tells me that she stopped Jardiance in the morning as she was getting some low blood sugars in the middle of the day.  At this visit, I advised her to restart Jardiance and stop glipizide in the morning.  At night, she tells me that she takes 10 mg before dinner, she is dropping her sugars too low.  We will continue with only 5 mg of glipizide before dinner. - I suggested: Patient Instructions  Please continue: - Metformin XR 2000 mg at bedtime  Restart: - Jardiance 25 mg daily in am  Please change: - Glipizide 5 mg before dinner  Please return in 3-4 months with your sugar log.  - we checked her HbA1c: 7.1% (much better) - advised to check sugars at different times of the day - 1x a day, rotating check times - advised for yearly eye exams >> she is UTD - return to clinic in 3-4 months    2. HL  -Reviewed latest lipid panel from 03/2019: LDL at goal, HDL slightly  low Lab Results  Component Value Date   CHOL 108 03/28/2019   HDL 32 (L) 03/28/2019   LDLCALC 52 03/28/2019   TRIG 137 03/28/2019   CHOLHDL 4 10/06/2016  -Continues simvastatin no side effects  3. Obesity class 1 -In the past, she lost a significant amount of weight on a plant-based diet, however, she gained 8 pounds before last visit -However, since last OV, she lost 22 lbs!!! -We will restart her SGLT 2 inhibitor which should also help with weight loss.  She could not tolerate Trulicity due to nausea and diarrhea.  Stop glipizide in the morning may also help with weight loss   Philemon Kingdom, MD PhD [facility]

## 2019-11-20 NOTE — Patient Instructions (Addendum)
Please continue: - Metformin XR 2000 mg at bedtime  Restart: - Jardiance 25 mg daily in am  Please change: - Glipizide 5 mg before dinner  Please return in 3-4 months with your sugar log.

## 2019-11-27 ENCOUNTER — Ambulatory Visit: Payer: 59 | Admitting: Podiatry

## 2019-11-27 ENCOUNTER — Other Ambulatory Visit: Payer: Self-pay

## 2019-11-27 ENCOUNTER — Encounter: Payer: Self-pay | Admitting: Podiatry

## 2019-11-27 ENCOUNTER — Ambulatory Visit (INDEPENDENT_AMBULATORY_CARE_PROVIDER_SITE_OTHER): Payer: 59

## 2019-11-27 DIAGNOSIS — M79671 Pain in right foot: Secondary | ICD-10-CM

## 2019-11-27 DIAGNOSIS — G5761 Lesion of plantar nerve, right lower limb: Secondary | ICD-10-CM | POA: Diagnosis not present

## 2019-11-27 MED ORDER — MELOXICAM 15 MG PO TABS: 15.0000 mg | ORAL_TABLET | Freq: Every day | ORAL | 1 refills | Status: DC

## 2019-11-27 MED ORDER — METHYLPREDNISOLONE 4 MG PO TBPK: ORAL_TABLET | ORAL | 0 refills | Status: DC

## 2019-11-27 MED FILL — METHYLPREDNISOLONE 4 MG TBP: 4 | 6 days supply | Qty: 21 | Fill #0

## 2019-11-27 MED FILL — MELOXICAM 15 MG TABLET: 15 | 30 days supply | Qty: 30 | Fill #0

## 2019-11-27 NOTE — Progress Notes (Signed)
   HPI: 59 y.o. female presenting today as an established patient for evaluation regarding a new complaint of right foot pain that is been going on for several months now.  Patient states that it hurts in certain shoes.  By the end of the day she has significant pain and tenderness.  She has not done anything for treatment  Past Medical History:  Diagnosis Date  . Allergy   . Arthritis    knees  . Asthma    asthmatic bronchitis  . Diabetes mellitus without complication ([place])   . GERD (gastroesophageal reflux disease)   . Hypercholesteremia      Physical Exam: General: The patient is alert and oriented x3 in no acute distress.  Dermatology: Skin is warm, dry and supple bilateral lower extremities. Negative for open lesions or macerations.  Vascular: Palpable pedal pulses bilaterally. No edema or erythema noted. Capillary refill within normal limits.  Neurological: Epicritic and protective threshold grossly intact bilaterally.   Musculoskeletal Exam: Range of motion within normal limits to all pedal and ankle joints bilateral. Muscle strength 5/5 in all groups bilateral.  There is pain on palpation to the third interspace right foot with a positive Mulder sign.  Pain with lateral compression of the metatarsal heads also noted.  Assessment: 1.  Morton's neuroma right third interspace   Plan of Care:  1. Patient evaluated.  2.  Injection 0.5 cc Celestone Soluspan injected into the third interspace right foot 3.  Prescription for a Medrol Dosepak 4.  Prescription for meloxicam 15 mg daily after completion of the Dosepak 5.  Recommend wide fitting shoes that do not compress the forefoot or toebox area 6.  Return to clinic in 4 weeks      Edrick Kins, DPM [facility]  Dr. Edrick Kins, DPM    [address], [place] [address]                Office [phone]  Fax [phone]

## 2019-12-14 ENCOUNTER — Other Ambulatory Visit: Payer: Self-pay | Admitting: Podiatry

## 2019-12-14 DIAGNOSIS — G5761 Lesion of plantar nerve, right lower limb: Secondary | ICD-10-CM

## 2019-12-18 MED FILL — metFORMIN HCL ER 500 MG TB2: 500 | 90 days supply | Qty: 360 | Fill #1

## 2019-12-25 ENCOUNTER — Ambulatory Visit: Payer: 59 | Admitting: Podiatry

## 2019-12-27 MED FILL — JARDIANCE 25 MG TABLET: 25 | 30 days supply | Qty: 30 | Fill #1

## 2020-01-24 MED FILL — JARDIANCE 25 MG TABLET: 25 | 30 days supply | Qty: 30 | Fill #2

## 2020-03-25 ENCOUNTER — Other Ambulatory Visit: Payer: Self-pay | Admitting: Internal Medicine

## 2020-03-26 ENCOUNTER — Other Ambulatory Visit: Payer: Self-pay | Admitting: Internal Medicine

## 2020-03-26 MED FILL — metFORMIN HCL ER 500 MG TB2: 500 | 90 days supply | Qty: 360 | Fill #0

## 2020-03-27 ENCOUNTER — Ambulatory Visit: Payer: 59 | Admitting: Internal Medicine

## 2020-03-27 DIAGNOSIS — H40013 Open angle with borderline findings, low risk, bilateral: Secondary | ICD-10-CM | POA: Diagnosis not present

## 2020-03-27 DIAGNOSIS — H04123 Dry eye syndrome of bilateral lacrimal glands: Secondary | ICD-10-CM | POA: Diagnosis not present

## 2020-03-27 DIAGNOSIS — E119 Type 2 diabetes mellitus without complications: Secondary | ICD-10-CM | POA: Diagnosis not present

## 2020-03-27 DIAGNOSIS — H52211 Irregular astigmatism, right eye: Secondary | ICD-10-CM | POA: Diagnosis not present

## 2020-03-27 LAB — HM DIABETES EYE EXAM

## 2020-05-20 DIAGNOSIS — Z1151 Encounter for screening for human papillomavirus (HPV): Secondary | ICD-10-CM | POA: Diagnosis not present

## 2020-05-20 DIAGNOSIS — Z6836 Body mass index (BMI) 36.0-36.9, adult: Secondary | ICD-10-CM | POA: Diagnosis not present

## 2020-05-20 DIAGNOSIS — Z01419 Encounter for gynecological examination (general) (routine) without abnormal findings: Secondary | ICD-10-CM | POA: Diagnosis not present

## 2020-05-23 DIAGNOSIS — R922 Inconclusive mammogram: Secondary | ICD-10-CM | POA: Diagnosis not present

## 2020-05-23 DIAGNOSIS — R921 Mammographic calcification found on diagnostic imaging of breast: Secondary | ICD-10-CM | POA: Diagnosis not present

## 2020-05-23 LAB — HM MAMMOGRAPHY

## 2020-05-24 MED FILL — LOSARTAN POTASSIUM 50 MG TA: 50 | 90 days supply | Qty: 90 | Fill #1

## 2020-05-24 MED FILL — SIMVASTATIN 20 MG TABLET: 20 | 90 days supply | Qty: 90 | Fill #1

## 2020-05-24 MED FILL — JARDIANCE 25 MG TABLET: 25 | 30 days supply | Qty: 30 | Fill #3

## 2020-05-29 ENCOUNTER — Ambulatory Visit: Payer: 59 | Admitting: Internal Medicine

## 2020-06-20 ENCOUNTER — Other Ambulatory Visit: Payer: Self-pay | Admitting: Physician Assistant

## 2020-06-20 DIAGNOSIS — L719 Rosacea, unspecified: Secondary | ICD-10-CM

## 2020-06-26 MED FILL — metFORMIN HCL ER 500 MG TB2: 500 | 90 days supply | Qty: 360 | Fill #1

## 2020-07-09 ENCOUNTER — Telehealth: Payer: Self-pay | Admitting: Internal Medicine

## 2020-07-09 NOTE — Telephone Encounter (Signed)
Patient just called stating she needed to cancel her appointment Friday - she cancelled her last 3 appointments back to back, ok to reschedule or no?

## 2020-07-09 NOTE — Telephone Encounter (Signed)
Please see below and advise Front desk

## 2020-07-09 NOTE — Telephone Encounter (Signed)
Ok to reschedule for he cannot miss any more appointments going forward.

## 2020-07-10 NOTE — Telephone Encounter (Signed)
Please see Dr. Arman Filter message

## 2020-07-12 ENCOUNTER — Ambulatory Visit: Payer: 59 | Admitting: Internal Medicine

## 2020-08-27 ENCOUNTER — Other Ambulatory Visit: Payer: Self-pay | Admitting: Internal Medicine

## 2020-08-27 MED FILL — FREESTYLE LITE TEST STRIP: 90 days supply | Qty: 300 | Fill #1

## 2020-08-27 MED FILL — LOSARTAN POTASSIUM 50 MG TA: 50 | 90 days supply | Qty: 90 | Fill #2

## 2020-08-27 MED FILL — JARDIANCE 25 MG TABLET: 25 | 30 days supply | Qty: 30 | Fill #4

## 2020-08-27 MED FILL — SIMVASTATIN 20 MG TABLET: 20 | 90 days supply | Qty: 90 | Fill #2

## 2020-08-29 ENCOUNTER — Other Ambulatory Visit (HOSPITAL_BASED_OUTPATIENT_CLINIC_OR_DEPARTMENT_OTHER): Payer: Self-pay

## 2020-09-04 ENCOUNTER — Telehealth: Payer: Self-pay | Admitting: Internal Medicine

## 2020-09-04 ENCOUNTER — Encounter: Payer: Self-pay | Admitting: Internal Medicine

## 2020-09-04 NOTE — Telephone Encounter (Signed)
Patient stated she will call us back if she decides to come in office. It won't be in two months and that she will be searching for another provider closer to her

## 2020-09-04 NOTE — Telephone Encounter (Signed)
Advise pt to schedule her next appt when she is in [place] in 2 months. She needs to be seen in office. If she needs a referral to another endocrinologist in [place] let us know.

## 2020-09-04 NOTE — Telephone Encounter (Signed)
Pt states since she is working from home now, she is never in [place] anymore and would like to see if Dr.Gherghe is open to virtual [facility] visits for the time being. Pt states she is normally in [place] and wont be in [place] for another at least 2 months and still wants to see the Dr. She wants to make sure its ok before she books an appt because if it's in person she will not be available.

## 2020-09-05 ENCOUNTER — Other Ambulatory Visit: Payer: Self-pay | Admitting: Internal Medicine

## 2020-09-05 DIAGNOSIS — E669 Obesity, unspecified: Secondary | ICD-10-CM

## 2020-09-05 DIAGNOSIS — E1165 Type 2 diabetes mellitus with hyperglycemia: Secondary | ICD-10-CM

## 2020-09-05 DIAGNOSIS — E782 Mixed hyperlipidemia: Secondary | ICD-10-CM

## 2020-09-10 ENCOUNTER — Emergency Department
Admission: EM | Admit: 2020-09-10 | Discharge: 2020-09-10 | Disposition: A | Discharge status: Home / Self Care | Payer: 59 | Source: Home / Self Care

## 2020-09-10 ENCOUNTER — Other Ambulatory Visit: Payer: Self-pay

## 2020-09-10 ENCOUNTER — Encounter: Payer: Self-pay | Admitting: Emergency Medicine

## 2020-09-10 DIAGNOSIS — G44209 Tension-type headache, unspecified, not intractable: Secondary | ICD-10-CM

## 2020-09-10 DIAGNOSIS — I10 Essential (primary) hypertension: Secondary | ICD-10-CM | POA: Diagnosis not present

## 2020-09-10 NOTE — ED Provider Notes (Signed)
[name]    CSN: [id number] Arrival date & time: 09/10/20  1420      History   Chief Complaint Chief Complaint  Patient presents with  . Headache    HPI Angela Casey is a 60 y.o. female.   HPI   Patient is here for taking high blood pressure.  She states that yesterday she she had a headache and when she looked at herself in the rearview mirror of her car her face looked flushed.  She states the headache is significant.  She took some ibuprofen with relief.  She took her blood pressure that was higher than it usually runs.  She states last night it was 159/95.  She usually runs in the 110-120/70 range.  She is on losartan daily.  She is a known type II diabetic.  Last A1c is 7.1.  She does get headaches periodically.  They are usually muscle tension headaches.  They are usually from disc disease in her neck and stress.  She did report that she had a deadline yesterday and thinks this may have caused some stress and exacerbated her headache.  No chest pain or shortness of breath.  No visual or hearing alterations.  No numbness or weakness in arms legs or trouble with balance or coordination.  No recent infection or sinus pressure.  No fall or head injury.  Past Medical History:  Diagnosis Date  . Allergy   . Arthritis    knees  . Asthma    asthmatic bronchitis  . Diabetes mellitus without complication ([place])   . GERD (gastroesophageal reflux disease)   . Hypercholesteremia     Patient Active Problem List   Diagnosis Date Noted  . Obesity (BMI 30-39.9) 02/25/2018  . Chronic urticaria 08/06/2016  . Perennial allergic rhinitis 08/06/2016  . Intrinsic atopic dermatitis 08/06/2016  . Hyperlipidemia 02/13/2016  . RAD (reactive airway disease) 07/02/2013  . BMI 40.0-44.9, adult ([place]) 07/02/2013  . Type 2 diabetes mellitus, uncontrolled ([place]) 06/29/2013    Past Surgical History:  Procedure Laterality Date  . ANTERIOR CRUCIATE LIGAMENT REPAIR Right   . BACK  SURGERY     L5-S1  . CARPAL TUNNEL RELEASE    . KNEE ARTHROSCOPY Left 06/28/2015   Procedure: Left Knee Arthroscopy and Debridement;  Surgeon: Meredith Pel, MD;  Location: [facility];  Service: Orthopedics;  Laterality: Left;  . KNEE ARTHROSCOPY WITH MEDIAL MENISECTOMY Left 06/28/2015   Procedure: KNEE ARTHROSCOPY WITH MEDIAL MENISECTOMY;  Surgeon: Meredith Pel, MD;  Location: [facility];  Service: Orthopedics;  Laterality: Left;  . KNEE SURGERY      OB History   No obstetric history on file.      Home Medications    Prior to Admission medications   Medication Sig Start Date End Date Taking? Authorizing Provider  Albuterol Sulfate (PROAIR RESPICLICK) 557 (90 Base) MCG/ACT AEPB Inhale 2 puffs into the lungs every 4 (four) hours as needed (shortness of breath). 12/10/17   [place], Elyse Jarvis, FNP  Blood Glucose Monitoring Suppl (FREESTYLE LITE) DEVI Use to check blood sugar 3 times a day 08/30/19   Philemon Kingdom, MD  empagliflozin (JARDIANCE) 25 MG TABS tablet Take 1 tablet (25 mg total) by mouth daily before breakfast. 11/20/19   Philemon Kingdom, MD  glipiZIDE (GLUCOTROL) 5 MG tablet TAKE 1 TABLET BY MOUTH BEFORE DINNER 11/20/19   Philemon Kingdom, MD  ibuprofen (ADVIL,MOTRIN) 600 MG tablet Take 600 mg by mouth every 6 (six)  hours as needed.    [provider]  Lancets (FREESTYLE) lancets Use to check blood sugar 3 times a day 08/30/19   Philemon Kingdom, MD  losartan (COZAAR) 50 MG tablet TAKE 1 TABLET BY MOUTH DAILY 11/20/19 11/19/20  Philemon Kingdom, MD  metFORMIN (GLUCOPHAGE-XR) 500 MG 24 hr tablet TAKE 2 TABLETS BY MOUTH 2 TIMES DAILY. 03/26/20 03/26/21  Philemon Kingdom, MD  simvastatin (ZOCOR) 20 MG tablet TAKE 1 TABLET BY MOUTH DAILY 11/20/19 11/19/20  Philemon Kingdom, MD    Family History Family History  Problem Relation Age of Onset  . Cancer Mother        uterine  . Diabetes Father   . COPD Father   . Asthma Father   .  Diabetes Paternal Grandfather   . Breast cancer Paternal Grandmother        in 54's  . Allergic rhinitis Neg Hx   . Angioedema Neg Hx   . Atopy Neg Hx   . Eczema Neg Hx   . Immunodeficiency Neg Hx   . Urticaria Neg Hx     Social History Social History   Tobacco Use  . Smoking status: Never Smoker  . Smokeless tobacco: Never Used  Vaping Use  . Vaping Use: Never used  Substance Use Topics  . Alcohol use: Yes    Alcohol/week: 0.0 standard drinks    Comment: social  . Drug use: No     Allergies   Livalo [pitavastatin], Exenatide, and Pollen extract   Review of Systems Review of Systems See HPI  Physical Exam Triage Vital Signs ED Triage Vitals  Enc Vitals Group     BP 09/10/20 1434 131/86     Pulse Rate 09/10/20 1434 97     Resp 09/10/20 1434 18     Temp 09/10/20 1434 98 F (36.7 C)     Temp Source 09/10/20 1434 Oral     SpO2 09/10/20 1434 97 %     Weight 09/10/20 1435 208 lb (94.3 kg)     Height 09/10/20 1435 5\' 4"  (1.626 m)     Head Circumference --      Peak Flow --      Pain Score 09/10/20 1435 6     Pain Loc --      Pain Edu? --      Excl. in [place]? --    No data found.  Updated Vital Signs BP 131/86 (BP Location: Left Arm)   Pulse 97   Temp 98 F (36.7 C) (Oral)   Resp 18   Ht 5\' 4"  (1.626 m)   Wt 94.3 kg   SpO2 97%   BMI 35.70 kg/m      Physical Exam Constitutional:      General: She is not in acute distress.    Appearance: Normal appearance. She is well-developed.     Comments: Overweight.  No acute distress.  Pleasant  HENT:     Head: Normocephalic and atraumatic.     Right Ear: Tympanic membrane and ear canal normal.     Left Ear: Tympanic membrane and ear canal normal.     Nose: Nose normal.     Mouth/Throat:     Mouth: Mucous membranes are moist.     Pharynx: No posterior oropharyngeal erythema.  Eyes:     Extraocular Movements: Extraocular movements intact.     Conjunctiva/sclera: Conjunctivae normal.     Pupils: Pupils are  equal, round, and reactive to light.  Cardiovascular:  Rate and Rhythm: Normal rate and regular rhythm.     Heart sounds: Normal heart sounds.  Pulmonary:     Effort: Pulmonary effort is normal. No respiratory distress.     Breath sounds: Normal breath sounds.  Abdominal:     General: There is no distension.     Palpations: Abdomen is soft.  Musculoskeletal:        General: Normal range of motion.     Cervical back: Normal range of motion.  Skin:    General: Skin is warm and dry.  Neurological:     General: No focal deficit present.     Mental Status: She is alert.  Psychiatric:        Behavior: Behavior normal.      UC Treatments / Results  Labs (all labs ordered are listed, but only abnormal results are displayed) Labs Reviewed - No data to display  EKG   Radiology No results found.  Procedures Procedures (including critical care time)  Medications Ordered in UC Medications - No data to display  Initial Impression / Assessment and Plan / UC Course  I have reviewed the triage vital signs and the nursing notes.  Pertinent labs & imaging results that were available during my care of the patient were reviewed by me and considered in my medical decision making (see chart for details).      Patient has kept a lot of her blood pressures over the last 48 hours.  She has had some elevated blood pressures, but nothing that appears to be dangerous.  No evidence of any stroke or TIA.  Blood pressure is generally well controlled.  Her blood pressure here is well controlled.  I encouraged her to continue her medication and follow-up with her primary care doctor. Final Clinical Impressions(s) / UC Diagnoses   Final diagnoses:  Tension headache  Elevated blood pressure reading in office with diagnosis of hypertension     Discharge Instructions     Continue on your current medications.  No changes necessary Your resting blood pressure is good Follow-up with your usual  primary care doctor   ED Prescriptions    None     PDMP not reviewed this encounter.   Raylene Everts, MD 09/10/20 1536

## 2020-09-10 NOTE — ED Triage Notes (Signed)
Headache started last night, concerned about BP, 159/95, flushed cheeks. Today 131/86. Vaccinated

## 2020-09-10 NOTE — Discharge Instructions (Addendum)
Continue on your current medications.  No changes necessary Your resting blood pressure is good Follow-up with your usual primary care doctor

## 2020-09-12 ENCOUNTER — Other Ambulatory Visit (HOSPITAL_COMMUNITY): Payer: Self-pay

## 2020-09-24 ENCOUNTER — Other Ambulatory Visit (HOSPITAL_COMMUNITY): Payer: Self-pay

## 2020-09-24 ENCOUNTER — Other Ambulatory Visit: Payer: Self-pay

## 2020-09-24 ENCOUNTER — Encounter: Payer: Self-pay | Admitting: Family Medicine

## 2020-09-24 ENCOUNTER — Ambulatory Visit: Payer: 59 | Admitting: Family Medicine

## 2020-09-24 VITALS — BP 119/69 | HR 91 | Temp 97.5°F | Ht 63.78 in | Wt 214.8 lb

## 2020-09-24 DIAGNOSIS — E1165 Type 2 diabetes mellitus with hyperglycemia: Secondary | ICD-10-CM

## 2020-09-24 DIAGNOSIS — J4520 Mild intermittent asthma, uncomplicated: Secondary | ICD-10-CM

## 2020-09-24 DIAGNOSIS — E782 Mixed hyperlipidemia: Secondary | ICD-10-CM | POA: Diagnosis not present

## 2020-09-24 MED ORDER — METFORMIN HCL ER 500 MG PO TB24
ORAL_TABLET | Freq: Two times a day (BID) | ORAL | 1 refills | Status: DC
  Filled 2020-09-24: qty 360, 90d supply, fill #0
  Filled 2020-12-30: qty 360, 90d supply, fill #1

## 2020-09-24 NOTE — Progress Notes (Signed)
Angela Casey - 60 y.o. female MRN [id number]  Date of birth: 24-Sep-1960  Subjective Chief Complaint  Patient presents with  . Diabetes    HPI Angela Casey is a 60 y.o. female here today to establish care.  She has a history of T2DM, asthma and HLD.  She was seeing endocrinology for management of diabetes but has not followed up in several months.  She would prefer PCP to manage as she doesn't want to continue driving to [facility] for appts.   Her diabetes is currently managed with glipizide 5mg  daily, metformin and jardiance.  She is tolerating these well. She has not been very diligent with following a low carb diet.  Blood sugars have been higher recently, around 150.  She plans to make changes to get back on track with diet.  She did take victoza at one point but had severe constipation.   She is doing well with simvastatin for management of HLD.    Asthma is well controlled with albuterol as needed.   ROS:  A comprehensive ROS was completed and negative except as noted per HPI  Allergies  Allergen Reactions  . Livalo [Pitavastatin] Itching, Rash and Hives  . Exenatide Nausea And Vomiting    diarrhea  . Pollen Extract     Other reaction(s): sneezing  . Januvia [Sitagliptin] Diarrhea    Past Medical History:  Diagnosis Date  . Allergy   . Arthritis    knees  . Asthma    asthmatic bronchitis  . Diabetes mellitus without complication ([place])   . GERD (gastroesophageal reflux disease)   . Hypercholesteremia     Past Surgical History:  Procedure Laterality Date  . ANTERIOR CRUCIATE LIGAMENT REPAIR Right   . BACK SURGERY     L5-S1  . CARPAL TUNNEL RELEASE    . DILATION AND CURETTAGE, DIAGNOSTIC / THERAPEUTIC    . KNEE ARTHROSCOPY Left 06/28/2015   Procedure: Left Knee Arthroscopy and Debridement;  Surgeon: Meredith Pel, MD;  Location: [place];  Service: Orthopedics;  Laterality: Left;  . KNEE ARTHROSCOPY WITH MEDIAL MENISECTOMY Left 06/28/2015    Procedure: KNEE ARTHROSCOPY WITH MEDIAL MENISECTOMY;  Surgeon: Meredith Pel, MD;  Location: [place];  Service: Orthopedics;  Laterality: Left;  . KNEE SURGERY      Social History   Socioeconomic History  . Marital status: Divorced    Spouse name: Not on file  . Number of children: 1  . Years of education: Not on file  . Highest education level: Not on file  Occupational History  . Not on file  Tobacco Use  . Smoking status: Never Smoker  . Smokeless tobacco: Never Used  Vaping Use  . Vaping Use: Never used  Substance and Sexual Activity  . Alcohol use: Yes    Alcohol/week: 0.0 - 1.0 standard drinks    Comment: social  . Drug use: No  . Sexual activity: Not Currently    Partners: Male    Birth control/protection: Condom  Other Topics Concern  . Not on file  Social History Narrative  . Not on file   Social Determinants of Health   Financial Resource Strain: Not on file  Food Insecurity: Not on file  Transportation Needs: Not on file  Physical Activity: Not on file  Stress: Not on file  Social Connections: Not on file    Family History  Problem Relation Age of Onset  . Cancer Mother  uterine  . Diabetes Father   . COPD Father   . Asthma Father   . Diabetes Paternal Grandfather   . Breast cancer Paternal Grandmother        in 37's  . Heart attack Paternal Grandmother   . Diabetes Paternal Grandmother   . Hypertension Maternal Grandmother   . Heart attack Maternal Grandmother   . Allergic rhinitis Neg Hx   . Angioedema Neg Hx   . Atopy Neg Hx   . Eczema Neg Hx   . Immunodeficiency Neg Hx   . Urticaria Neg Hx     Health Maintenance  Topic Date Due  . Hepatitis C Screening  Never done  . HIV Screening  Never done  . PAP SMEAR-Modifier  Never done  . COLONOSCOPY (Pts 45-39yrs Insurance coverage will need to be confirmed)  Never done  . FOOT EXAM  07/27/2014  . HEMOGLOBIN A1C  05/21/2020  . TETANUS/TDAP  09/24/2021  (Originally 04/17/1981)  . INFLUENZA VACCINE  01/06/2021  . OPHTHALMOLOGY EXAM  03/27/2021  . MAMMOGRAM  05/23/2022  . PNEUMOCOCCAL POLYSACCHARIDE VACCINE AGE 33-64 HIGH RISK  Completed  . COVID-19 Vaccine  Completed  . HPV VACCINES  Aged Out     ----------------------------------------------------------------------------------------------------------------------------------------------------------------------------------------------------------------- Physical Exam BP 119/69 (BP Location: Left Arm, Patient Position: Sitting, Cuff Size: Large)   Pulse 91   Temp (!) 97.5 F (36.4 C)   Ht 5' 3.78" (1.62 m)   Wt 214 lb 12.5 oz (97.4 kg)   LMP 06/08/2010 (Within Months)   SpO2 99%   BMI 37.12 kg/m   Physical Exam Constitutional:      Appearance: Normal appearance.  Eyes:     General: No scleral icterus. Cardiovascular:     Rate and Rhythm: Normal rate and regular rhythm.  Pulmonary:     Effort: Pulmonary effort is normal.     Breath sounds: Normal breath sounds.  Musculoskeletal:     Cervical back: Neck supple.  Neurological:     General: No focal deficit present.     Mental Status: She is alert.  Psychiatric:        Mood and Affect: Mood normal.        Behavior: Behavior normal.     ------------------------------------------------------------------------------------------------------------------------------------------------------------------------------------------------------------------- Assessment and Plan  No problem-specific Assessment & Plan notes found for this encounter.   Meds ordered this encounter  Medications  . metFORMIN (GLUCOPHAGE-XR) 500 MG 24 hr tablet    Sig: TAKE 2 TABLETS BY MOUTH 2 TIMES DAILY.    Dispense:  360 tablet    Refill:  1    Return in about 3 months (around 12/24/2020) for T2DM.    This visit occurred during the SARS-CoV-2 public health emergency.  Safety protocols were in place, including screening questions prior to the  visit, additional usage of staff PPE, and extensive cleaning of exam room while observing appropriate contact time as indicated for disinfecting solutions.

## 2020-09-24 NOTE — Assessment & Plan Note (Signed)
Doing well with simvstatin.  Update lipid panel.

## 2020-09-24 NOTE — Patient Instructions (Addendum)
Great to meet you today! Continue to work on dietary changes to improve blood sugars.  Have labs completed.  See me again in 3 months.      Diabetes Mellitus and Nutrition, Adult When you have diabetes, or diabetes mellitus, it is very important to have healthy eating habits because your blood sugar (glucose) levels are greatly affected by what you eat and drink. Eating healthy foods in the right amounts, at about the same times every day, can help you:  Control your blood glucose.  Lower your risk of heart disease.  Improve your blood pressure.  Reach or maintain a healthy weight. What can affect my meal plan? Every person with diabetes is different, and each person has different needs for a meal plan. Your health care provider may recommend that you work with a dietitian to make a meal plan that is best for you. Your meal plan may vary depending on factors such as:  The calories you need.  The medicines you take.  Your weight.  Your blood glucose, blood pressure, and cholesterol levels.  Your activity level.  Other health conditions you have, such as heart or kidney disease. How do carbohydrates affect me? Carbohydrates, also called carbs, affect your blood glucose level more than any other type of food. Eating carbs naturally raises the amount of glucose in your blood. Carb counting is a method for keeping track of how many carbs you eat. Counting carbs is important to keep your blood glucose at a healthy level, especially if you use insulin or take certain oral diabetes medicines. It is important to know how many carbs you can safely have in each meal. This is different for every person. Your dietitian can help you calculate how many carbs you should have at each meal and for each snack. How does alcohol affect me? Alcohol can cause a sudden decrease in blood glucose (hypoglycemia), especially if you use insulin or take certain oral diabetes medicines. Hypoglycemia can be a  life-threatening condition. Symptoms of hypoglycemia, such as sleepiness, dizziness, and confusion, are similar to symptoms of having too much alcohol.  Do not drink alcohol if: ? Your health care provider tells you not to drink. ? You are pregnant, may be pregnant, or are planning to become pregnant.  If you drink alcohol: ? Do not drink on an empty stomach. ? Limit how much you use to:  0-1 drink a day for women.  0-2 drinks a day for men. ? Be aware of how much alcohol is in your drink. In the [place] one drink equals one 12 oz bottle of beer (355 mL), one 5 oz glass of wine (148 mL), or one 1 oz glass of hard liquor (44 mL). ? Keep yourself hydrated with water, diet soda, or unsweetened iced tea.  Keep in mind that regular soda, juice, and other mixers may contain a lot of sugar and must be counted as carbs. What are tips for following this plan? Reading food labels  Start by checking the serving size on the "Nutrition Facts" label of packaged foods and drinks. The amount of calories, carbs, fats, and other nutrients listed on the label is based on one serving of the item. Many items contain more than one serving per package.  Check the total grams (g) of carbs in one serving. You can calculate the number of servings of carbs in one serving by dividing the total carbs by 15. For example, if a food has 30 g of total carbs  per serving, it would be equal to 2 servings of carbs.  Check the number of grams (g) of saturated fats and trans fats in one serving. Choose foods that have a low amount or none of these fats.  Check the number of milligrams (mg) of salt (sodium) in one serving. Most people should limit total sodium intake to less than 2,300 mg per day.  Always check the nutrition information of foods labeled as "low-fat" or "nonfat." These foods may be higher in added sugar or refined carbs and should be avoided.  Talk to your dietitian to identify your daily goals for nutrients  listed on the label. Shopping  Avoid buying canned, pre-made, or processed foods. These foods tend to be high in fat, sodium, and added sugar.  Shop around the outside edge of the grocery store. This is where you will most often find fresh fruits and vegetables, bulk grains, fresh meats, and fresh dairy. Cooking  Use low-heat cooking methods, such as baking, instead of high-heat cooking methods like deep frying.  Cook using healthy oils, such as olive, canola, or sunflower oil.  Avoid cooking with butter, cream, or high-fat meats. Meal planning  Eat meals and snacks regularly, preferably at the same times every day. Avoid going long periods of time without eating.  Eat foods that are high in fiber, such as fresh fruits, vegetables, beans, and whole grains. Talk with your dietitian about how many servings of carbs you can eat at each meal.  Eat 4-6 oz (112-168 g) of lean protein each day, such as lean meat, chicken, fish, eggs, or tofu. One ounce (oz) of lean protein is equal to: ? 1 oz (28 g) of meat, chicken, or fish. ? 1 egg. ?  cup (62 g) of tofu.  Eat some foods each day that contain healthy fats, such as avocado, nuts, seeds, and fish.   What foods should I eat? Fruits Berries. Apples. Oranges. Peaches. Apricots. Plums. Grapes. Mango. Papaya. Pomegranate. Kiwi. Cherries. Vegetables Lettuce. Spinach. Leafy greens, including kale, chard, collard greens, and mustard greens. Beets. Cauliflower. Cabbage. Broccoli. Carrots. Green beans. Tomatoes. Peppers. Onions. Cucumbers. Brussels sprouts. Grains Whole grains, such as whole-wheat or whole-grain bread, crackers, tortillas, cereal, and pasta. Unsweetened oatmeal. Quinoa. Brown or wild rice. Meats and other proteins Seafood. Poultry without skin. Lean cuts of poultry and beef. Tofu. Nuts. Seeds. Dairy Low-fat or fat-free dairy products such as milk, yogurt, and cheese. The items listed above may not be a complete list of foods and  beverages you can eat. Contact a dietitian for more information. What foods should I avoid? Fruits Fruits canned with syrup. Vegetables Canned vegetables. Frozen vegetables with butter or cream sauce. Grains Refined white flour and flour products such as bread, pasta, snack foods, and cereals. Avoid all processed foods. Meats and other proteins Fatty cuts of meat. Poultry with skin. Breaded or fried meats. Processed meat. Avoid saturated fats. Dairy Full-fat yogurt, cheese, or milk. Beverages Sweetened drinks, such as soda or iced tea. The items listed above may not be a complete list of foods and beverages you should avoid. Contact a dietitian for more information. Questions to ask a health care provider  Do I need to meet with a diabetes educator?  Do I need to meet with a dietitian?  What number can I call if I have questions?  When are the best times to check my blood glucose? Where to find more information:  American Diabetes Association: diabetes.org  Academy of Nutrition and  Dietetics: www.eatright.[employer] of Diabetes and Digestive and Kidney Diseases: [employer]  Association of Diabetes Care and Education Specialists: www.diabeteseducator.org Summary  It is important to have healthy eating habits because your blood sugar (glucose) levels are greatly affected by what you eat and drink.  A healthy meal plan will help you control your blood glucose and maintain a healthy lifestyle.  Your health care provider may recommend that you work with a dietitian to make a meal plan that is best for you.  Keep in mind that carbohydrates (carbs) and alcohol have immediate effects on your blood glucose levels. It is important to count carbs and to use alcohol carefully. This information is not intended to replace advice given to you by your health care provider. Make sure you discuss any questions you have with your health care provider. Document Revised:  05/02/2019 Document Reviewed: 05/02/2019 Elsevier Patient Education  2021 [employer].

## 2020-09-24 NOTE — Assessment & Plan Note (Addendum)
Some elevated blood sugars at home.  Metformin renewed.  Update a1c and renal function.  Discussed making dietary changes to help with controlling blood sugars.

## 2020-09-24 NOTE — Assessment & Plan Note (Signed)
She will continue albuterol as needed.

## 2020-09-25 LAB — COMPLETE METABOLIC PANEL WITH GFR
AG Ratio: 1.7 (calc) (ref 1.0–2.5)
ALT: 23 U/L (ref 6–29)
AST: 18 U/L (ref 10–35)
Albumin: 4.5 g/dL (ref 3.6–5.1)
Alkaline phosphatase (APISO): 80 U/L (ref 37–153)
BUN: 15 mg/dL (ref 7–25)
CO2: 27 mmol/L (ref 20–32)
Calcium: 9.4 mg/dL (ref 8.6–10.4)
Chloride: 102 mmol/L (ref 98–110)
Creat: 0.63 mg/dL (ref 0.50–1.05)
GFR, Est African American: 115 mL/min/{1.73_m2} (ref >=60)
GFR, Est Non African American: 99 mL/min/{1.73_m2} (ref >=60)
Globulin: 2.7 g/dL (calc) (ref 1.9–3.7)
Glucose, Bld: 224 mg/dL — ABNORMAL HIGH (ref 65–99)
Potassium: 4.8 mmol/L (ref 3.5–5.3)
Sodium: 138 mmol/L (ref 135–146)
Total Bilirubin: 0.5 mg/dL (ref 0.2–1.2)
Total Protein: 7.2 g/dL (ref 6.1–8.1)

## 2020-09-25 LAB — LIPID PANEL W/REFLEX DIRECT LDL
Cholesterol: 122 mg/dL (ref <200)
HDL: 44 mg/dL — ABNORMAL LOW (ref >=50)
LDL Cholesterol (Calc): 56 mg/dL (calc)
Non-HDL Cholesterol (Calc): 78 mg/dL (calc) (ref <130)
Total CHOL/HDL Ratio: 2.8 (calc) (ref <5.0)
Triglycerides: 132 mg/dL (ref <150)

## 2020-09-25 LAB — HEMOGLOBIN A1C
Hgb A1c MFr Bld: 9.1 % of total Hgb — ABNORMAL HIGH (ref <5.7)
Mean Plasma Glucose: 214 mg/dL
eAG (mmol/L): 11.9 mmol/L

## 2020-09-27 ENCOUNTER — Encounter: Payer: Self-pay | Admitting: Family Medicine

## 2020-10-15 ENCOUNTER — Other Ambulatory Visit: Payer: Self-pay | Admitting: Internal Medicine

## 2020-10-15 ENCOUNTER — Other Ambulatory Visit (HOSPITAL_COMMUNITY): Payer: Self-pay

## 2020-10-16 ENCOUNTER — Other Ambulatory Visit (HOSPITAL_COMMUNITY): Payer: Self-pay

## 2020-10-16 MED ORDER — EMPAGLIFLOZIN 25 MG PO TABS
ORAL_TABLET | Freq: Every day | ORAL | 0 refills | Status: DC
  Filled 2020-10-16: qty 30, 30d supply, fill #0

## 2020-11-27 ENCOUNTER — Encounter: Payer: Self-pay | Admitting: Family Medicine

## 2020-11-27 DIAGNOSIS — Z20822 Contact with and (suspected) exposure to covid-19: Secondary | ICD-10-CM | POA: Diagnosis not present

## 2020-11-28 ENCOUNTER — Telehealth (INDEPENDENT_AMBULATORY_CARE_PROVIDER_SITE_OTHER): Payer: 59 | Admitting: Physician Assistant

## 2020-11-28 ENCOUNTER — Other Ambulatory Visit (HOSPITAL_COMMUNITY): Payer: Self-pay

## 2020-11-28 ENCOUNTER — Encounter: Payer: Self-pay | Admitting: Physician Assistant

## 2020-11-28 ENCOUNTER — Other Ambulatory Visit: Payer: Self-pay

## 2020-11-28 VITALS — BP 132/84 | HR 90 | Temp 99.0°F

## 2020-11-28 DIAGNOSIS — U071 COVID-19: Secondary | ICD-10-CM

## 2020-11-28 DIAGNOSIS — J019 Acute sinusitis, unspecified: Secondary | ICD-10-CM

## 2020-11-28 MED ORDER — EMPAGLIFLOZIN 25 MG PO TABS
25.0000 mg | ORAL_TABLET | Freq: Every day | ORAL | 0 refills | Status: DC
  Filled 2020-11-28: qty 30, 30d supply, fill #0

## 2020-11-28 MED ORDER — BENZONATATE 200 MG PO CAPS
200.0000 mg | ORAL_CAPSULE | Freq: Three times a day (TID) | ORAL | 0 refills | Status: DC | PRN
  Filled 2020-11-28: qty 20, 6d supply, fill #0

## 2020-11-28 MED ORDER — ALBUTEROL SULFATE HFA 108 (90 BASE) MCG/ACT IN AERS
1.0000 | INHALATION_SPRAY | RESPIRATORY_TRACT | 1 refills | Status: DC | PRN
  Filled 2020-11-28: qty 18, 16d supply, fill #0

## 2020-11-28 MED ORDER — GUAIFENESIN ER 600 MG PO TB12
600.0000 mg | ORAL_TABLET | Freq: Two times a day (BID) | ORAL | 0 refills | Status: DC
  Filled 2020-11-28: qty 30, 8d supply, fill #0

## 2020-11-28 NOTE — Patient Instructions (Signed)
Nirmatrelvir; Ritonavir Oral Tablets What is this medication? NIRMATRELVIR; RITONAVIR (nir ma trel vir; ri TOE na veer) treats COVID-19. It is an antiviral medication. It may decrease the risk of developing severe symptoms of COVID-19. It may also decrease the chance of going to the hospital. This medication is not approved by the FDA. The FDA has authorized emergencyuse of this medication during the COVID-19 pandemic. This medicine may be used for other purposes; ask your health care provider orpharmacist if you have questions. COMMON BRAND NAME(S): PAXLOVID What should I tell my care team before I take this medication? They need to know if you have any of these conditions: Any allergies Any serious illness Kidney disease Liver disease An unusual or allergic reaction to nirmatrelvir, ritonavir, other medications, foods, dyes, or preservatives Pregnant or trying to get pregnant Breast-feeding How should I use this medication? Take this medication by mouth with water. Take it as directed on the prescription label at the same time every day. Swallow the tablets whole. You can take it with or without food. If it upsets your stomach, take it with food. Take all of this medication unless your care team tells you to stop it early.Keep taking it even if you think you are better. Talk to your care team about the use of this medication in children. While it may be prescribed for children as young as 12 years for selected conditions,precautions do apply. Overdosage: If you think you have taken too much of this medicine contact apoison control center or emergency room at once. NOTE: This medicine is only for you. Do not share this medicine with others. What if I miss a dose? If you miss a dose, take it as soon as you can unless it is more than 8 hours late. If it is more than 8 hours late, skip the missed dose. Take the next dose at the normal time. Do not take extra or 2 doses at the same time to make  upfor the missed dose. What may interact with this medication? Do not take this medication with any of the following medications: Alfuzosin Certain medications for anxiety or sleep like midazolam, triazolam Certain medications for cancer like apalutamide, enzalutamide Certain medications for cholesterol like lovastatin, simvastatin Certain medications for irregular heart beat like amiodarone, dronedarone, flecainide, propafenone, quinidine Certain medications for pain like meperidine, piroxicam, propoxyphene Certain medications for psychotic disorders like clozapine, lurasidone, pimozide Certain medications for seizures like carbamazepine, phenobarbital, phenytoin Colchicine Ergot alkaloids like dihydroergotamine, ergonovine, ergotamine, methylergonovine Ranolazine Rifampin Sildenafil St. John's wort This medication may also interact with the following medications: Bedaquiline Birth control pills Bosentan Certain antibiotics like erythromycin or clarithromycin Certain medications for blood pressure like amlodipine, diltiazem, felodipine, nicardipine, nifedipine Certain medications for cancer like abemaciclib, ceritinib, dasatinib, encorafenib, ibrutinib, ivosidenib, neratinib, nilotinib, venetoclax, vinblastine, vincristine Certain medications for cholesterol like atorvastatin, rosuvastatin Certain medications for depression like bupropion, trazodone Certain medications for fungal infections like isavuconazonium, itraconazole, ketoconazole, voriconazole Certain medications for hepatitis C like elbasvir; grazoprevir, dasabuvir; ombitasvir; paritaprevir; ritonavir, glecaprevir; pibrentasvir, sofosbuvir; velpatasvir; voxilaprevir Certain medications for HIV or AIDS Certain medications for irregular heartbeat like bepridil, lidocaine Certain medications that treat or prevent blood clots like rivaroxaban, warfarin Digoxin Fentanyl Medications that lower your chance of fighting infection  like cyclosporine, sirolimus, tacrolimus Methadone Quetiapine Rifabutin Salmeterol Steroid medications like budesonide, dexamethasone, fluticasone, prednisone, triamcinolone This list may not describe all possible interactions. Give your health care provider a list of all the medicines, herbs, non-prescription drugs, or dietary supplements  you use. Also tell them if you smoke, drink alcohol, or use illegaldrugs. Some items may interact with your medicine. What should I watch for while using this medication? Your condition will be monitored carefully while you are receiving this medication. Visit your care team for regular checkups. Tell your care team ifyour symptoms do not start to get better or if they get worse. If you have untreated HIV infection, this medication may lead to some HIVmedications not working as well in the future. Birth control may not work properly while you are taking this medication. Talkto your care team about using an extra method of birth control. What side effects may I notice from receiving this medication? Side effects that you should report to your care team as soon as possible: Allergic reactions-skin rash, itching, hives, swelling of the face, lips, tongue, or throat Liver injury-right upper belly pain, loss of appetite, nausea, light-colored stool, dark yellow or brown urine, yellowing skin or eyes, unusual weakness or fatigue Side effects that usually do not require medical attention (report these toyour care team if they continue or are bothersome): Change in taste Diarrhea Increase in blood pressure Muscle pain This list may not describe all possible side effects. Call your doctor for medical advice about side effects. You may report side effects to FDA at1-800-FDA-1088. Where should I keep my medication? Keep out of the reach of children and pets. Store at room temperature between 20 and 25 degrees C (68 and 77 degrees F).Get rid of any unused medication  after the expiration date. To get rid of medications that are no longer needed or have expired: Take the medication to a medication take-back program. Check with your pharmacy or law enforcement to find a location. If you cannot return the medication, check the label or package insert to see if the medication should be thrown out in the garbage or flushed down the toilet. If you are not sure, ask your care team. If it is safe to put it in the trash, take the medication out of the container. Mix the medication with cat litter, dirt, coffee grounds, or other unwanted substance. Seal the mixture in a bag or container. Put it in the trash. NOTE: This sheet is a summary. It may not cover all possible information. If you have questions about this medicine, talk to your doctor, pharmacist, orhealth care provider.  2022 Elsevier/Gold Standard (2020-06-03 17:41:26)

## 2020-11-28 NOTE — Progress Notes (Signed)
Telemedicine Visit via  Video & Audio (App used: Hyperspace/Caregility) Audio only - telephone (patient preference /  technical difficulty with MyChart video application)  I connected with Angela Casey on 11/28/20 at 3:26 PM  by phone or  telemedicine application as noted above  I verified that I am speaking with or regarding  the correct patient using two identifiers.  Participants: Myself, Nelson Chimes PA-C Patient: Angela Casey   Patient location: [place] My location: [facility]   I discussed the limitations of evaluation and management  by telemedicine and the availability of in person appointments.  The participant(s) above expressed understanding and  agreed to proceed with this appointment via telemedicine.       History of Present Illness: Angela Casey is a 60 y.o. female with PMH of mild intermittent asthma, environmental allergies, DM2, HLD, and obesity who would like to discuss COVID symptoms   Symptom onset Monday night 11/25/20, feels better today compared to yesterday Tested positive by rapid home test on 11/26/20 Endorses fever Tmax 100F - treating with AlkaSeltzer and Advil Congestion, HA, dental/sinus pain, and dry/hacking cough Uses Albuterol inhaler as needed that expired in 2020 - would like a refill Has been taking 2 puffs every 3-4 hrs x 2 days Also taking vitamin D, Nyquil, Vicks vaporub and drinking extra fluids Fully immunized for COVID-19  New onset:  Progression: Gradually worsening Severity:  Assoc signs/symptoms: Sick contacts: Immunizations: Treatments tried:  Also needs Jardiance refill   ROS Negative for: lethargy/confusion, neck stiffness, vision changes, chest pain, orthopnea, blood-tinged sputum, abdominal pain, focal weakness, syncope   Observations/Objective: BP 132/84 Comment: MyChart::no vitals to report for this visit  Pulse 90 Comment: MyChart::no vitals to report for this visit  Temp 99 F (37.2 C) (Oral)  Comment: MyChart::no vitals to report for this visit  LMP 06/08/2010 (Within Months)  BP Readings from Last 3 Encounters:  11/28/20 132/84  09/24/20 119/69  09/10/20 131/86   Exam limited by telehealth Gen: alert, appears fatigued, non-toxic appearing Pulm: normal phonation, speaking in full sentences   Lab and Radiology Results No results found for this or any previous visit (from the past 72 hour(s)). No results found.  Lab Results  Component Value Date   HGBA1C 9.1 (H) 09/24/2020   Lab Results  Component Value Date   CREATININE 0.63 09/24/2020   BUN 15 09/24/2020   NA 138 09/24/2020   K 4.8 09/24/2020   CL 102 09/24/2020   CO2 27 09/24/2020   Lab Results  Component Value Date   ALT 23 09/24/2020   AST 18 09/24/2020   ALKPHOS 78 03/28/2019   BILITOT 0.5 09/24/2020      Assessment and Plan: 60 y.o. female with The primary encounter diagnosis was COVID-19 virus infection. A diagnosis of Acute non-recurrent sinusitis, unspecified location was also pertinent to this visit.    Reviewed risks and benefits of Paxlovid. Patient deferred prescription. Counseled must be started within 5 days and to notify provider tomorrow if she changes her mind. EUA info sent via [place]   Patient deferred Prednisone for asthma symptoms. Instructed to schedule Albuterol 2 puffs three times per day and continue Q4H prn bronchospasm  Counseled on supportive care for COVID-19 and viral sinusitis/cough Counseled on ER precautions  Renal function and A1C dated 09/24/20 reviewed - GFR>60 We discussed that diabetes is not controlled and she requires Q50month follow-ups with PCP  Sent in 30 day supply of Jardiance and instructed patient to schedule a  f/u with Dr. Zigmund Daniel for mid-July  PDMP not reviewed this encounter. No orders of the defined types were placed in this encounter.  Meds ordered this encounter  Medications   albuterol (VENTOLIN HFA) 108 (90 Base) MCG/ACT inhaler    Sig:  Inhale 1-2 puffs into the lungs every 4 (four) hours as needed for wheezing or shortness of breath (bronchospasm).    Dispense:  18 g    Refill:  1    Order Specific Question:   Supervising Provider    Answer:   Emeterio Reeve [[id number]]   benzonatate (TESSALON) 200 MG capsule    Sig: Take 1 capsule (200 mg total) by mouth 3 (three) times daily as needed for cough.    Dispense:  20 capsule    Refill:  0    Order Specific Question:   Supervising Provider    Answer:   Emeterio Reeve [[id number]]   guaiFENesin (MUCINEX) 600 MG 12 hr tablet    Sig: Take 1-2 tablets (600-1,200 mg total) by mouth 2 (two) times daily.    Dispense:  30 tablet    Refill:  0    1 package    Order Specific Question:   Supervising Provider    Answer:   Emeterio Reeve [[id number]]   empagliflozin (JARDIANCE) 25 MG TABS tablet    Sig: Take 1 tablet (25 mg total) by mouth daily before breakfast.    Dispense:  30 tablet    Refill:  0    Pt due for appt in July    Order Specific Question:   Supervising Provider    Answer:   Emeterio Reeve [[id number]]   Patient Instructions  Nirmatrelvir; Ritonavir Oral Tablets What is this medication? NIRMATRELVIR; RITONAVIR (nir ma trel vir; ri TOE na veer) treats COVID-19. It is an antiviral medication. It may decrease the risk of developing severe symptoms of COVID-19. It may also decrease the chance of going to the hospital. This medication is not approved by the FDA. The FDA has authorized emergencyuse of this medication during the COVID-19 pandemic. This medicine may be used for other purposes; ask your health care provider orpharmacist if you have questions. COMMON BRAND NAME(S): PAXLOVID What should I tell my care team before I take this medication? They need to know if you have any of these conditions: Any allergies Any serious illness Kidney disease Liver disease An unusual or allergic reaction to nirmatrelvir, ritonavir, other medications, foods, dyes, or  preservatives Pregnant or trying to get pregnant Breast-feeding How should I use this medication? Take this medication by mouth with water. Take it as directed on the prescription label at the same time every day. Swallow the tablets whole. You can take it with or without food. If it upsets your stomach, take it with food. Take all of this medication unless your care team tells you to stop it early.Keep taking it even if you think you are better. Talk to your care team about the use of this medication in children. While it may be prescribed for children as young as 12 years for selected conditions,precautions do apply. Overdosage: If you think you have taken too much of this medicine contact apoison control center or emergency room at once. NOTE: This medicine is only for you. Do not share this medicine with others. What if I miss a dose? If you miss a dose, take it as soon as you can unless it is more than 8 hours late. If it is more than 8 hours late,  skip the missed dose. Take the next dose at the normal time. Do not take extra or 2 doses at the same time to make upfor the missed dose. What may interact with this medication? Do not take this medication with any of the following medications: Alfuzosin Certain medications for anxiety or sleep like midazolam, triazolam Certain medications for cancer like apalutamide, enzalutamide Certain medications for cholesterol like lovastatin, simvastatin Certain medications for irregular heart beat like amiodarone, dronedarone, flecainide, propafenone, quinidine Certain medications for pain like meperidine, piroxicam, propoxyphene Certain medications for psychotic disorders like clozapine, lurasidone, pimozide Certain medications for seizures like carbamazepine, phenobarbital, phenytoin Colchicine Ergot alkaloids like dihydroergotamine, ergonovine, ergotamine, methylergonovine Ranolazine Rifampin Sildenafil St. John's wort This medication may also  interact with the following medications: Bedaquiline Birth control pills Bosentan Certain antibiotics like erythromycin or clarithromycin Certain medications for blood pressure like amlodipine, diltiazem, felodipine, nicardipine, nifedipine Certain medications for cancer like abemaciclib, ceritinib, dasatinib, encorafenib, ibrutinib, ivosidenib, neratinib, nilotinib, venetoclax, vinblastine, vincristine Certain medications for cholesterol like atorvastatin, rosuvastatin Certain medications for depression like bupropion, trazodone Certain medications for fungal infections like isavuconazonium, itraconazole, ketoconazole, voriconazole Certain medications for hepatitis C like elbasvir; grazoprevir, dasabuvir; ombitasvir; paritaprevir; ritonavir, glecaprevir; pibrentasvir, sofosbuvir; velpatasvir; voxilaprevir Certain medications for HIV or AIDS Certain medications for irregular heartbeat like bepridil, lidocaine Certain medications that treat or prevent blood clots like rivaroxaban, warfarin Digoxin Fentanyl Medications that lower your chance of fighting infection like cyclosporine, sirolimus, tacrolimus Methadone Quetiapine Rifabutin Salmeterol Steroid medications like budesonide, dexamethasone, fluticasone, prednisone, triamcinolone This list may not describe all possible interactions. Give your health care provider a list of all the medicines, herbs, non-prescription drugs, or dietary supplements you use. Also tell them if you smoke, drink alcohol, or use illegaldrugs. Some items may interact with your medicine. What should I watch for while using this medication? Your condition will be monitored carefully while you are receiving this medication. Visit your care team for regular checkups. Tell your care team ifyour symptoms do not start to get better or if they get worse. If you have untreated HIV infection, this medication may lead to some HIVmedications not working as well in the  future. Birth control may not work properly while you are taking this medication. Talkto your care team about using an extra method of birth control. What side effects may I notice from receiving this medication? Side effects that you should report to your care team as soon as possible: Allergic reactions-skin rash, itching, hives, swelling of the face, lips, tongue, or throat Liver injury-right upper belly pain, loss of appetite, nausea, light-colored stool, dark yellow or brown urine, yellowing skin or eyes, unusual weakness or fatigue Side effects that usually do not require medical attention (report these toyour care team if they continue or are bothersome): Change in taste Diarrhea Increase in blood pressure Muscle pain This list may not describe all possible side effects. Call your doctor for medical advice about side effects. You may report side effects to FDA at1-800-FDA-1088. Where should I keep my medication? Keep out of the reach of children and pets. Store at room temperature between 20 and 25 degrees C (68 and 77 degrees F).Get rid of any unused medication after the expiration date. To get rid of medications that are no longer needed or have expired: Take the medication to a medication take-back program. Check with your pharmacy or law enforcement to find a location. If you cannot return the medication, check the label or package insert to see if the  medication should be thrown out in the garbage or flushed down the toilet. If you are not sure, ask your care team. If it is safe to put it in the trash, take the medication out of the container. Mix the medication with cat litter, dirt, coffee grounds, or other unwanted substance. Seal the mixture in a bag or container. Put it in the trash. NOTE: This sheet is a summary. It may not cover all possible information. If you have questions about this medicine, talk to your doctor, pharmacist, orhealth care provider.  2022 [employer]vier/Gold  Standard (2020-06-03 17:41:26)  Instructions sent via MyChart.   Follow Up Instructions: No follow-ups on file.    I discussed the assessment and treatment plan with the patient. The patient was provided an opportunity to ask questions and all were answered. The patient agreed with the plan and demonstrated an understanding of the instructions.   The patient was advised to call back or seek an in-person evaluation if any new concerns, if symptoms worsen or if the condition fails to improve as anticipated.  29 minutes of non-face-to-face time was provided during this encounter.      . . . . . . . . . . . . . Marland Kitchen                   Historical information moved to improve visibility of documentation.  Past Medical History:  Diagnosis Date   Allergy    Arthritis    knees   Asthma    asthmatic bronchitis   Diabetes mellitus without complication (HCC)    GERD (gastroesophageal reflux disease)    Hypercholesteremia    Past Surgical History:  Procedure Laterality Date   ANTERIOR CRUCIATE LIGAMENT REPAIR Right    BACK SURGERY     L5-S1   CARPAL TUNNEL RELEASE     DILATION AND CURETTAGE, DIAGNOSTIC / THERAPEUTIC     KNEE ARTHROSCOPY Left 06/28/2015   Procedure: Left Knee Arthroscopy and Debridement;  Surgeon: Meredith Pel, MD;  Location: [place];  Service: Orthopedics;  Laterality: Left;   KNEE ARTHROSCOPY WITH MEDIAL MENISECTOMY Left 06/28/2015   Procedure: KNEE ARTHROSCOPY WITH MEDIAL MENISECTOMY;  Surgeon: Meredith Pel, MD;  Location: [place];  Service: Orthopedics;  Laterality: Left;   KNEE SURGERY     Social History   Tobacco Use   Smoking status: Never   Smokeless tobacco: Never  Substance Use Topics   Alcohol use: Yes    Alcohol/week: 0.0 - 1.0 standard drinks    Comment: social   family history includes Asthma in her father; Breast cancer in her paternal grandmother; COPD in her father; Cancer  in her mother; Diabetes in her father, paternal grandfather, and paternal grandmother; Heart attack in her maternal grandmother and paternal grandmother; Hypertension in her maternal grandmother.  Medications: Current Outpatient Medications  Medication Sig Dispense Refill   albuterol (VENTOLIN HFA) 108 (90 Base) MCG/ACT inhaler Inhale 1-2 puffs into the lungs every 4 (four) hours as needed for wheezing or shortness of breath (bronchospasm). 18 g 1   benzonatate (TESSALON) 200 MG capsule Take 1 capsule (200 mg total) by mouth 3 (three) times daily as needed for cough. 20 capsule 0   Blood Glucose Monitoring Suppl (FREESTYLE LITE) DEVI Use to check blood sugar 3 times a day 1 each 0   glipiZIDE (GLUCOTROL) 5 MG tablet TAKE 1 TABLET BY MOUTH BEFORE DINNER 90 tablet 3   guaiFENesin (MUCINEX) 600 MG  12 hr tablet Take 1-2 tablets (600-1,200 mg total) by mouth 2 (two) times daily. 30 tablet 0   Lancets (FREESTYLE) lancets Use to check blood sugar 3 times a day 300 each 12   losartan (COZAAR) 50 MG tablet TAKE 1 TABLET BY MOUTH DAILY 90 tablet 3   metFORMIN (GLUCOPHAGE-XR) 500 MG 24 hr tablet TAKE 2 TABLETS BY MOUTH 2 TIMES DAILY. 360 tablet 1   empagliflozin (JARDIANCE) 25 MG TABS tablet Take 1 tablet (25 mg total) by mouth daily before breakfast. 30 tablet 0   simvastatin (ZOCOR) 20 MG tablet TAKE 1 TABLET BY MOUTH DAILY 90 tablet 3   No current facility-administered medications for this visit.   Allergies  Allergen Reactions   Livalo [Pitavastatin] Itching, Rash and Hives   Exenatide Nausea And Vomiting    diarrhea   Pollen Extract     Other reaction(s): sneezing   Januvia [Sitagliptin] Diarrhea     If phone visit, billing and coding can please add appropriate modifier if needed

## 2020-12-13 ENCOUNTER — Other Ambulatory Visit (HOSPITAL_COMMUNITY): Payer: Self-pay

## 2020-12-24 ENCOUNTER — Ambulatory Visit: Payer: 59 | Admitting: Family Medicine

## 2020-12-30 ENCOUNTER — Other Ambulatory Visit (HOSPITAL_COMMUNITY): Payer: Self-pay

## 2021-01-16 ENCOUNTER — Encounter: Payer: Self-pay | Admitting: Family Medicine

## 2021-01-16 DIAGNOSIS — E1165 Type 2 diabetes mellitus with hyperglycemia: Secondary | ICD-10-CM

## 2021-01-21 ENCOUNTER — Other Ambulatory Visit: Payer: Self-pay

## 2021-01-21 ENCOUNTER — Other Ambulatory Visit: Payer: Self-pay | Admitting: Family Medicine

## 2021-01-21 ENCOUNTER — Other Ambulatory Visit (HOSPITAL_COMMUNITY): Payer: Self-pay

## 2021-01-21 DIAGNOSIS — E782 Mixed hyperlipidemia: Secondary | ICD-10-CM

## 2021-01-21 DIAGNOSIS — E1165 Type 2 diabetes mellitus with hyperglycemia: Secondary | ICD-10-CM

## 2021-01-21 MED ORDER — LOSARTAN POTASSIUM 50 MG PO TABS
50.0000 mg | ORAL_TABLET | Freq: Every day | ORAL | 0 refills | Status: DC
  Filled 2021-01-21: qty 30, 30d supply, fill #0

## 2021-01-21 MED ORDER — GLIPIZIDE 5 MG PO TABS: ORAL_TABLET | ORAL | 0 refills | Status: DC

## 2021-01-21 MED ORDER — SIMVASTATIN 20 MG PO TABS
20.0000 mg | ORAL_TABLET | Freq: Every day | ORAL | 0 refills | Status: DC
Start: 2021-01-21 — End: 2021-04-23
  Filled 2021-01-21: qty 30, 30d supply, fill #0

## 2021-01-21 MED ORDER — EMPAGLIFLOZIN 25 MG PO TABS
25.0000 mg | ORAL_TABLET | Freq: Every day | ORAL | 0 refills | Status: DC
  Filled 2021-01-21: qty 30, 30d supply, fill #0

## 2021-01-22 ENCOUNTER — Other Ambulatory Visit (HOSPITAL_COMMUNITY): Payer: Self-pay

## 2021-02-14 ENCOUNTER — Other Ambulatory Visit (HOSPITAL_COMMUNITY): Payer: Self-pay

## 2021-02-17 ENCOUNTER — Ambulatory Visit: Payer: 59 | Admitting: Family Medicine

## 2021-02-19 ENCOUNTER — Other Ambulatory Visit (HOSPITAL_COMMUNITY): Payer: Self-pay

## 2021-02-19 ENCOUNTER — Emergency Department
Admission: EM | Admit: 2021-02-19 | Discharge: 2021-02-19 | Disposition: A | Discharge status: Home / Self Care | Payer: 59 | Source: Home / Self Care

## 2021-02-19 ENCOUNTER — Telehealth: Payer: 59 | Admitting: Emergency Medicine

## 2021-02-19 ENCOUNTER — Other Ambulatory Visit: Payer: Self-pay

## 2021-02-19 DIAGNOSIS — J309 Allergic rhinitis, unspecified: Secondary | ICD-10-CM | POA: Diagnosis not present

## 2021-02-19 DIAGNOSIS — J0100 Acute maxillary sinusitis, unspecified: Secondary | ICD-10-CM | POA: Diagnosis not present

## 2021-02-19 DIAGNOSIS — R059 Cough, unspecified: Secondary | ICD-10-CM

## 2021-02-19 MED ORDER — METHYLPREDNISOLONE SODIUM SUCC 125 MG IJ SOLR
125.0000 mg | Freq: Once | INTRAMUSCULAR | Status: AC
  Administered 2021-02-19: 125 mg via INTRAMUSCULAR

## 2021-02-19 MED ORDER — FEXOFENADINE HCL 180 MG PO TABS
180.0000 mg | ORAL_TABLET | Freq: Every day | ORAL | 0 refills | Status: DC
  Filled 2021-02-19 – 2021-12-02 (×2): qty 15, 15d supply, fill #0

## 2021-02-19 MED ORDER — CEFDINIR 300 MG PO CAPS
300.0000 mg | ORAL_CAPSULE | Freq: Two times a day (BID) | ORAL | 0 refills | Status: AC
  Filled 2021-02-19: qty 14, 7d supply, fill #0

## 2021-02-19 NOTE — ED Triage Notes (Signed)
Pt presents with productive cough x10d and HA. Pt states she has been taking advil for her pain

## 2021-02-19 NOTE — ED Provider Notes (Signed)
Angela Casey CARE    CSN: [id number] Arrival date & time: 02/19/21  1442      History   Chief Complaint Chief Complaint  Patient presents with   Cough    HPI Angela Casey is a 60 y.o. female.   HPI 60 year old female presents with productive cough x10 days and headache.  PMH significant for mild intermittent asthma, reactive airways disease, asthmatic bronchitis, uncontrolled T2DM.    Past Medical History:  Diagnosis Date   Allergy    Arthritis    knees   Asthma    asthmatic bronchitis   Diabetes mellitus without complication ([place])    GERD (gastroesophageal reflux disease)    Hypercholesteremia     Patient Active Problem List   Diagnosis Date Noted   Obesity (BMI 30-39.9) 02/25/2018   Perennial allergic rhinitis 08/06/2016   Hyperlipidemia 02/13/2016   RAD (reactive airway disease) 07/02/2013   BMI 40.0-44.9, adult ([place]) 07/02/2013   Type 2 diabetes mellitus, uncontrolled ([place]) 06/29/2013   Asthma, mild intermittent 12/27/2012    Past Surgical History:  Procedure Laterality Date   ANTERIOR CRUCIATE LIGAMENT REPAIR Right    BACK SURGERY     L5-S1   CARPAL TUNNEL RELEASE     DILATION AND CURETTAGE, DIAGNOSTIC / THERAPEUTIC     KNEE ARTHROSCOPY Left 06/28/2015   Procedure: Left Knee Arthroscopy and Debridement;  Surgeon: Meredith Pel, MD;  Location: [place];  Service: Orthopedics;  Laterality: Left;   KNEE ARTHROSCOPY WITH MEDIAL MENISECTOMY Left 06/28/2015   Procedure: KNEE ARTHROSCOPY WITH MEDIAL MENISECTOMY;  Surgeon: Meredith Pel, MD;  Location: [facility];  Service: Orthopedics;  Laterality: Left;   KNEE SURGERY      OB History   No obstetric history on file.      Home Medications    Prior to Admission medications   Medication Sig Start Date End Date Taking? Authorizing Provider  cefdinir (OMNICEF) 300 MG capsule Take 1 capsule (300 mg total) by mouth 2 (two) times daily for 7 days. 02/19/21 02/26/21  Yes Eliezer Lofts, FNP  fexofenadine [facility] ALLERGY) 180 MG tablet Take 1 tablet (180 mg total) by mouth daily for 15 days. 02/19/21 03/06/21 Yes Eliezer Lofts, FNP  albuterol (VENTOLIN HFA) 108 (90 Base) MCG/ACT inhaler Inhale 1-2 puffs into the lungs every 4 (four) hours as needed for wheezing or shortness of breath (bronchospasm). 11/28/20   Trixie Dredge, PA-C  benzonatate (TESSALON) 200 MG capsule Take 1 capsule (200 mg total) by mouth 3 (three) times daily as needed for cough. Patient not taking: Reported on 02/19/2021 11/28/20   Trixie Dredge, PA-C  Blood Glucose Monitoring Suppl (FREESTYLE LITE) DEVI Use to check blood sugar 3 times a day 08/30/19   Philemon Kingdom, MD  empagliflozin (JARDIANCE) 25 MG TABS tablet Take 1 tablet (25 mg total) by mouth daily before breakfast. 01/21/21 01/21/22  Emeterio Reeve, DO  glipiZIDE (GLUCOTROL) 5 MG tablet TAKE 1 TABLET BY MOUTH BEFORE DINNER 01/21/21   Emeterio Reeve, DO  guaiFENesin (MUCINEX) 600 MG 12 hr tablet Take 1-2 tablets (600-1,200 mg total) by mouth 2 (two) times daily. 11/28/20   Trixie Dredge, PA-C  Lancets (FREESTYLE) lancets Use to check blood sugar 3 times a day 08/30/19   Philemon Kingdom, MD  losartan (COZAAR) 50 MG tablet Take 1 tablet by mouth daily. 01/21/21 01/21/22  Emeterio Reeve, DO  metFORMIN (GLUCOPHAGE-XR) 500 MG 24 hr tablet TAKE 2 TABLETS BY MOUTH 2 TIMES DAILY.  09/24/20 09/24/21  Luetta Nutting, DO  simvastatin (ZOCOR) 20 MG tablet Take 1 tablet by mouth daily. 01/21/21 01/21/22  Emeterio Reeve, DO    Family History Family History  Problem Relation Age of Onset   Cancer Mother        uterine   Diabetes Father    COPD Father    Asthma Father    Diabetes Paternal Grandfather    Breast cancer Paternal Grandmother        in 17's   Heart attack Paternal Grandmother    Diabetes Paternal Grandmother    Hypertension Maternal Grandmother    Heart attack Maternal  Grandmother    Allergic rhinitis Neg Hx    Angioedema Neg Hx    Atopy Neg Hx    Eczema Neg Hx    Immunodeficiency Neg Hx    Urticaria Neg Hx     Social History Social History   Tobacco Use   Smoking status: Never   Smokeless tobacco: Never  Vaping Use   Vaping Use: Never used  Substance Use Topics   Alcohol use: Yes    Alcohol/week: 0.0 - 1.0 standard drinks    Comment: social   Drug use: No     Allergies   Livalo [pitavastatin], Exenatide, Pollen extract, and Januvia [sitagliptin]   Review of Systems Review of Systems  HENT:  Positive for congestion, postnasal drip, sinus pressure and sinus pain.   Respiratory:  Positive for cough.   Neurological:  Positive for headaches.  All other systems reviewed and are negative.   Physical Exam Triage Vital Signs ED Triage Vitals  Enc Vitals Group     BP 02/19/21 1451 124/82     Pulse Rate 02/19/21 1451 (!) 102     Resp 02/19/21 1451 14     Temp 02/19/21 1451 99 F (37.2 C)     Temp Source 02/19/21 1451 Oral     SpO2 02/19/21 1451 96 %     Weight --      Height --      Head Circumference --      Peak Flow --      Pain Score 02/19/21 1452 6     Pain Loc --      Pain Edu? --      Excl. in [place]? --    No data found.  Updated Vital Signs BP 124/82 (BP Location: Left Arm)   Pulse (!) 102   Temp 99 F (37.2 C) (Oral)   Resp 14   LMP 06/08/2010 (Within Months)   SpO2 96%    Physical Exam Vitals and nursing note reviewed.  Constitutional:      General: She is not in acute distress.    Appearance: Normal appearance. She is obese. She is not ill-appearing.  HENT:     Head: Normocephalic and atraumatic.     Right Ear: Tympanic membrane and external ear normal.     Left Ear: Tympanic membrane and external ear normal.     Ears:     Comments: Moderate eustachian tube dysfunction noted bilaterally    Nose:     Right Turbinates: Enlarged.     Left Turbinates: Enlarged.     Right Sinus: Maxillary sinus tenderness  present.     Left Sinus: Maxillary sinus tenderness present.     Comments: Turbinates are erythematous/edematous    Mouth/Throat:     Lips: Pink.     Mouth: Mucous membranes are moist.     Pharynx: Oropharynx is  clear. Uvula midline.     Comments: Moderate amount of clear drainage of posterior oropharynx noted Eyes:     Extraocular Movements: Extraocular movements intact.     Conjunctiva/sclera: Conjunctivae normal.     Pupils: Pupils are equal, round, and reactive to light.  Cardiovascular:     Rate and Rhythm: Normal rate and regular rhythm.     Pulses: Normal pulses.     Heart sounds: Normal heart sounds. No murmur heard.   No friction rub. No gallop.  Pulmonary:     Effort: Pulmonary effort is normal.     Breath sounds: Normal breath sounds. No wheezing, rhonchi or rales.  Musculoskeletal:        General: Normal range of motion.     Cervical back: Normal range of motion and neck supple.  Skin:    General: Skin is warm and dry.  Neurological:     General: No focal deficit present.     Mental Status: She is alert and oriented to person, place, and time. Mental status is at baseline.  Psychiatric:        Mood and Affect: Mood normal.        Behavior: Behavior normal.        Thought Content: Thought content normal.     UC Treatments / Results  Labs (all labs ordered are listed, but only abnormal results are displayed) Labs Reviewed - No data to display  EKG   Radiology No results found.  Procedures Procedures (including critical care time)  Medications Ordered in UC Medications  methylPREDNISolone sodium succinate (SOLU-MEDROL) 125 mg/2 mL injection 125 mg (has no administration in time range)    Initial Impression / Assessment and Plan / UC Course  I have reviewed the triage vital signs and the nursing notes.  Pertinent labs & imaging results that were available during my care of the patient were reviewed by me and considered in my medical decision making  (see chart for details).     MDM: 1.  Cough-IM Solu-Medrol 125 given once in clinic prior to discharge today; 2.  Acute maxillary sinusitis, recurrence not specified-rec cefdinir advised/instructed patient to take medication as directed with food to completion.  Advised patient to take Allegra daily with first dose of antibiotic for the next 7 days, then as needed. Encouraged patient to increase daily water intake to 48 ounces daily while taking these medications. Final Clinical Impressions(s) / UC Diagnoses   Final diagnoses:  Cough  Acute maxillary sinusitis, recurrence not specified  Allergic rhinitis, unspecified seasonality, unspecified trigger     Discharge Instructions      Advised/instructed patient to take medication as directed with food to completion.  Advised patient to take Allegra daily with first dose of antibiotic for the next 7 days, then as needed. Encouraged patient to increase daily water intake to 48 ounces daily while taking these medications.     ED Prescriptions     Medication Sig Dispense Auth. Provider   cefdinir (OMNICEF) 300 MG capsule Take 1 capsule (300 mg total) by mouth 2 (two) times daily for 7 days. 14 capsule Eliezer Lofts, FNP   fexofenadine [facility] ALLERGY) 180 MG tablet Take 1 tablet (180 mg total) by mouth daily for 15 days. 15 tablet Eliezer Lofts, FNP      PDMP not reviewed this encounter.   Eliezer Lofts, [place] 02/19/21 1533

## 2021-02-19 NOTE — Discharge Instructions (Addendum)
Advised/instructed patient to take medication as directed with food to completion.  Advised patient to take Allegra daily with first dose of antibiotic for the next 7 days, then as needed. Encouraged patient to increase daily water intake to 48 ounces daily while taking these medications.

## 2021-02-20 NOTE — Progress Notes (Unsigned)
Advised pt she needs to be seen in person.

## 2021-02-21 LAB — COVID-19, FLU A+B NAA
Influenza A, NAA: NOT DETECTED
Influenza B, NAA: NOT DETECTED
SARS-CoV-2, NAA: NOT DETECTED

## 2021-03-10 ENCOUNTER — Ambulatory Visit: Payer: 59 | Admitting: Family Medicine

## 2021-03-10 ENCOUNTER — Other Ambulatory Visit (HOSPITAL_COMMUNITY): Payer: Self-pay

## 2021-03-10 ENCOUNTER — Other Ambulatory Visit: Payer: Self-pay

## 2021-03-10 ENCOUNTER — Encounter: Payer: Self-pay | Admitting: Family Medicine

## 2021-03-10 VITALS — BP 125/77 | HR 98 | Temp 97.4°F | Ht 63.5 in | Wt 215.8 lb

## 2021-03-10 DIAGNOSIS — E782 Mixed hyperlipidemia: Secondary | ICD-10-CM | POA: Diagnosis not present

## 2021-03-10 DIAGNOSIS — I10 Essential (primary) hypertension: Secondary | ICD-10-CM | POA: Insufficient documentation

## 2021-03-10 DIAGNOSIS — E669 Obesity, unspecified: Secondary | ICD-10-CM

## 2021-03-10 DIAGNOSIS — E1169 Type 2 diabetes mellitus with other specified complication: Secondary | ICD-10-CM

## 2021-03-10 DIAGNOSIS — Z23 Encounter for immunization: Secondary | ICD-10-CM

## 2021-03-10 DIAGNOSIS — E1165 Type 2 diabetes mellitus with hyperglycemia: Secondary | ICD-10-CM

## 2021-03-10 MED ORDER — METFORMIN HCL ER 500 MG PO TB24
2000.0000 mg | ORAL_TABLET | Freq: Every day | ORAL | 1 refills | Status: DC
  Filled 2021-03-10: qty 360, 90d supply, fill #0
  Filled 2021-06-25: qty 360, 90d supply, fill #1

## 2021-03-10 MED ORDER — RYBELSUS 3 MG PO TABS
3.0000 mg | ORAL_TABLET | Freq: Every day | ORAL | 0 refills | Status: DC
  Filled 2021-03-10: qty 30, 30d supply, fill #0

## 2021-03-10 MED ORDER — GLIPIZIDE 5 MG PO TABS: ORAL_TABLET | ORAL | 1 refills | Status: DC

## 2021-03-10 MED ORDER — EMPAGLIFLOZIN 25 MG PO TABS
25.0000 mg | ORAL_TABLET | Freq: Every day | ORAL | 1 refills | Status: DC
  Filled 2021-03-10: qty 90, 90d supply, fill #0
  Filled 2021-10-06: qty 90, 90d supply, fill #1

## 2021-03-10 MED ORDER — RYBELSUS 7 MG PO TABS
7.0000 mg | ORAL_TABLET | Freq: Every day | ORAL | 3 refills | Status: DC
  Filled 2021-03-10: qty 30, fill #0

## 2021-03-10 NOTE — Patient Instructions (Signed)
Great to see you! Continue to work on dietary changes.  Continue jardiance, metformin and glipizide.  Add Rybelsus daily.  Start 3mg  for the first month and then increase to 7mg  daily.  See me again in 3 months.

## 2021-03-10 NOTE — Progress Notes (Signed)
Angela Casey - 60 y.o. female MRN [id number]  Date of birth: 01/15/1961  Subjective Chief Complaint  Patient presents with   Diabetes    HPI Angela Casey is a 60 year old female here today for follow-up visit.  She reports that since her last visit her diet has been very poor.  States that she loves to eat and has been consuming a lot of ice cream over the past few months.  She has been checking blood sugars at home with readings into the 240s.  She has not had any symptoms related to elevated blood sugars.  She is taking Jardiance, glipizide and metformin.  She did try that Victoza previously and had significant constipation with this.  She has hesitant to try another GLP-1.  She continues on losartan for management of hypertension.  She is tolerating this well and denies side effects.  She has not had chest pain, shortness of breath, palpitations, headache or vision changes.  Continues to tolerate simvastatin well for management of hyperlipidemia.  ROS:  A comprehensive ROS was completed and negative except as noted per HPI  Allergies  Allergen Reactions   Livalo [Pitavastatin] Itching, Rash and Hives   Exenatide Nausea And Vomiting    diarrhea   Pollen Extract     Other reaction(s): sneezing   Januvia [Sitagliptin] Diarrhea    Past Medical History:  Diagnosis Date   Allergy    Arthritis    knees   Asthma    asthmatic bronchitis   Diabetes mellitus without complication (HCC)    GERD (gastroesophageal reflux disease)    Hypercholesteremia     Past Surgical History:  Procedure Laterality Date   ANTERIOR CRUCIATE LIGAMENT REPAIR Right    BACK SURGERY     L5-S1   CARPAL TUNNEL RELEASE     DILATION AND CURETTAGE, DIAGNOSTIC / THERAPEUTIC     KNEE ARTHROSCOPY Left 06/28/2015   Procedure: Left Knee Arthroscopy and Debridement;  Surgeon: Meredith Pel, MD;  Location: [facility];  Service: Orthopedics;  Laterality: Left;   KNEE ARTHROSCOPY WITH MEDIAL  MENISECTOMY Left 06/28/2015   Procedure: KNEE ARTHROSCOPY WITH MEDIAL MENISECTOMY;  Surgeon: Meredith Pel, MD;  Location: [place];  Service: Orthopedics;  Laterality: Left;   KNEE SURGERY      Social History   Socioeconomic History   Marital status: Divorced    Spouse name: Not on file   Number of children: 1   Years of education: Not on file   Highest education level: Not on file  Occupational History   Not on file  Tobacco Use   Smoking status: Never   Smokeless tobacco: Never  Vaping Use   Vaping Use: Never used  Substance and Sexual Activity   Alcohol use: Yes    Alcohol/week: 0.0 - 1.0 standard drinks    Comment: social   Drug use: No   Sexual activity: Not Currently    Partners: Male    Birth control/protection: Condom  Other Topics Concern   Not on file  Social History Narrative   Not on file   Social Determinants of Health   Financial Resource Strain: Not on file  Food Insecurity: Not on file  Transportation Needs: Not on file  Physical Activity: Not on file  Stress: Not on file  Social Connections: Not on file    Family History  Problem Relation Age of Onset   Cancer Mother        uterine   Diabetes Father  COPD Father    Asthma Father    Diabetes Paternal Grandfather    Breast cancer Paternal Grandmother        in 49's   Heart attack Paternal Grandmother    Diabetes Paternal Grandmother    Hypertension Maternal Grandmother    Heart attack Maternal Grandmother    Allergic rhinitis Neg Hx    Angioedema Neg Hx    Atopy Neg Hx    Eczema Neg Hx    Immunodeficiency Neg Hx    Urticaria Neg Hx     Health Maintenance  Topic Date Due   HIV Screening  Never done   Hepatitis C Screening  Never done   PAP SMEAR-Modifier  Never done   Zoster Vaccines- Shingrix (1 of 2) Never done   COVID-19 Vaccine (4 - Booster for Pfizer series) 07/30/2020   TETANUS/TDAP  09/24/2021 (Originally 04/17/1981)   HEMOGLOBIN A1C  03/26/2021    OPHTHALMOLOGY EXAM  03/27/2021   MAMMOGRAM  05/23/2021   FOOT EXAM  03/10/2022   COLONOSCOPY (Pts 45-21yrs Insurance coverage will need to be confirmed)  11/25/2024   INFLUENZA VACCINE  Completed   HPV VACCINES  Aged Out     ----------------------------------------------------------------------------------------------------------------------------------------------------------------------------------------------------------------- Physical Exam BP 125/77 (BP Location: Left Arm, Patient Position: Sitting, Cuff Size: Large)   Pulse 98   Temp (!) 97.4 F (36.3 C)   Ht 5' 3.5" (1.613 m)   Wt 215 lb 12.8 oz (97.9 kg)   LMP 06/08/2010 (Within Months)   SpO2 97%   BMI 37.63 kg/m   Physical Exam Constitutional:      Appearance: Normal appearance.  Eyes:     General: No scleral icterus. Cardiovascular:     Rate and Rhythm: Normal rate and regular rhythm.  Pulmonary:     Effort: Pulmonary effort is normal.     Breath sounds: Normal breath sounds.  Musculoskeletal:     Cervical back: Neck supple.  Neurological:     General: No focal deficit present.     Mental Status: She is alert.  Psychiatric:        Mood and Affect: Mood normal.        Behavior: Behavior normal.    ------------------------------------------------------------------------------------------------------------------------------------------------------------------------------------------------------------------- Assessment and Plan  Hyperlipidemia Lab Results  Component Value Date   LDLCALC 56 09/24/2020  LDL at goal.  Tolerating simvastatin well.  Continue at current strength.  Type 2 diabetes mellitus with obesity ([place]) Diabetes is poorly controlled.  She will continue current medications.  We discussed making changes to diet to help with blood sugar control.  We also discussed GLP-1 options.  Starting Rybelsus as this may have less GI side effects.  Essential hypertension Blood pressure well controlled.   Continue losartan at current strength.   Meds ordered this encounter  Medications   empagliflozin (JARDIANCE) 25 MG TABS tablet    Sig: Take 1 tablet (25 mg total) by mouth daily before breakfast.    Dispense:  90 tablet    Refill:  1   glipiZIDE (GLUCOTROL) 5 MG tablet    Sig: TAKE 1 TABLET BY MOUTH BEFORE DINNER    Dispense:  90 tablet    Refill:  1   metFORMIN (GLUCOPHAGE-XR) 500 MG 24 hr tablet    Sig: Take 4 tablets (2,000 mg total) by mouth daily.    Dispense:  360 tablet    Refill:  1   Semaglutide (RYBELSUS) 3 MG TABS    Sig: Take 1 tablet by mouth daily for 30 days then  increase to 7 mg tablets daily    Dispense:  30 tablet    Refill:  0   Semaglutide (RYBELSUS) 7 MG TABS    Sig: Take 1 tablet by mouth daily.    Dispense:  30 tablet    Refill:  3    Return in about 3 months (around 06/10/2021) for T2DM.    This visit occurred during the SARS-CoV-2 public health emergency.  Safety protocols were in place, including screening questions prior to the visit, additional usage of staff PPE, and extensive cleaning of exam room while observing appropriate contact time as indicated for disinfecting solutions.

## 2021-03-10 NOTE — Assessment & Plan Note (Signed)
Lab Results  Component Value Date   LDLCALC 56 09/24/2020  LDL at goal.  Tolerating simvastatin well.  Continue at current strength.

## 2021-03-10 NOTE — Assessment & Plan Note (Signed)
Blood pressure well controlled.  Continue losartan at current strength.

## 2021-03-10 NOTE — Assessment & Plan Note (Signed)
Diabetes is poorly controlled.  She will continue current medications.  We discussed making changes to diet to help with blood sugar control.  We also discussed GLP-1 options.  Starting Rybelsus as this may have less GI side effects.

## 2021-03-11 LAB — POCT GLYCOSYLATED HEMOGLOBIN (HGB A1C): HbA1c, POC (controlled diabetic range): 11.9 % — AB (ref 0.0–7.0)

## 2021-03-11 NOTE — Progress Notes (Signed)
My bad. It's complete now. Apologies

## 2021-03-12 ENCOUNTER — Other Ambulatory Visit (HOSPITAL_COMMUNITY): Payer: Self-pay

## 2021-03-19 ENCOUNTER — Other Ambulatory Visit: Payer: Self-pay | Admitting: Family Medicine

## 2021-03-19 ENCOUNTER — Other Ambulatory Visit: Payer: Self-pay | Admitting: Osteopathic Medicine

## 2021-03-19 ENCOUNTER — Other Ambulatory Visit (HOSPITAL_COMMUNITY): Payer: Self-pay

## 2021-03-19 DIAGNOSIS — E782 Mixed hyperlipidemia: Secondary | ICD-10-CM

## 2021-03-19 DIAGNOSIS — E1165 Type 2 diabetes mellitus with hyperglycemia: Secondary | ICD-10-CM

## 2021-03-19 MED ORDER — LOSARTAN POTASSIUM 50 MG PO TABS
50.0000 mg | ORAL_TABLET | Freq: Every day | ORAL | 1 refills | Status: DC
  Filled 2021-03-19: qty 90, 90d supply, fill #0
  Filled 2021-07-21: qty 90, 90d supply, fill #1

## 2021-03-19 MED ORDER — GLIPIZIDE 5 MG PO TABS
ORAL_TABLET | ORAL | 3 refills | Status: DC
  Filled 2021-03-19: qty 90, 90d supply, fill #0
  Filled 2021-11-14: qty 90, 90d supply, fill #1
  Filled 2021-12-02 – 2022-02-06 (×2): qty 90, 90d supply, fill #2

## 2021-04-21 DIAGNOSIS — H2513 Age-related nuclear cataract, bilateral: Secondary | ICD-10-CM | POA: Diagnosis not present

## 2021-04-21 DIAGNOSIS — H04123 Dry eye syndrome of bilateral lacrimal glands: Secondary | ICD-10-CM | POA: Diagnosis not present

## 2021-04-21 DIAGNOSIS — E119 Type 2 diabetes mellitus without complications: Secondary | ICD-10-CM | POA: Diagnosis not present

## 2021-04-21 DIAGNOSIS — H40013 Open angle with borderline findings, low risk, bilateral: Secondary | ICD-10-CM | POA: Diagnosis not present

## 2021-04-21 LAB — HM DIABETES EYE EXAM

## 2021-04-23 ENCOUNTER — Other Ambulatory Visit: Payer: Self-pay | Admitting: Osteopathic Medicine

## 2021-04-23 ENCOUNTER — Other Ambulatory Visit (HOSPITAL_COMMUNITY): Payer: Self-pay

## 2021-04-23 ENCOUNTER — Other Ambulatory Visit: Payer: Self-pay | Admitting: Family Medicine

## 2021-04-23 MED ORDER — SIMVASTATIN 20 MG PO TABS
20.0000 mg | ORAL_TABLET | Freq: Every day | ORAL | 1 refills | Status: DC
  Filled 2021-04-23: qty 90, 90d supply, fill #0

## 2021-04-25 ENCOUNTER — Encounter: Payer: Self-pay | Admitting: Family Medicine

## 2021-05-22 DIAGNOSIS — Z01419 Encounter for gynecological examination (general) (routine) without abnormal findings: Secondary | ICD-10-CM | POA: Diagnosis not present

## 2021-05-22 DIAGNOSIS — Z1151 Encounter for screening for human papillomavirus (HPV): Secondary | ICD-10-CM | POA: Diagnosis not present

## 2021-05-22 DIAGNOSIS — Z6838 Body mass index (BMI) 38.0-38.9, adult: Secondary | ICD-10-CM | POA: Diagnosis not present

## 2021-05-22 LAB — HM PAP SMEAR: HM Pap smear: NEGATIVE

## 2021-05-26 ENCOUNTER — Other Ambulatory Visit: Payer: Self-pay | Admitting: Obstetrics and Gynecology

## 2021-05-26 DIAGNOSIS — Z780 Asymptomatic menopausal state: Secondary | ICD-10-CM

## 2021-05-29 DIAGNOSIS — R922 Inconclusive mammogram: Secondary | ICD-10-CM | POA: Diagnosis not present

## 2021-05-29 DIAGNOSIS — R921 Mammographic calcification found on diagnostic imaging of breast: Secondary | ICD-10-CM | POA: Diagnosis not present

## 2021-05-29 DIAGNOSIS — N6489 Other specified disorders of breast: Secondary | ICD-10-CM | POA: Diagnosis not present

## 2021-05-29 LAB — HM MAMMOGRAPHY

## 2021-06-10 ENCOUNTER — Ambulatory Visit: Payer: 59 | Admitting: Family Medicine

## 2021-06-11 ENCOUNTER — Ambulatory Visit (INDEPENDENT_AMBULATORY_CARE_PROVIDER_SITE_OTHER): Payer: 59

## 2021-06-11 ENCOUNTER — Other Ambulatory Visit: Payer: Self-pay

## 2021-06-11 DIAGNOSIS — Z0389 Encounter for observation for other suspected diseases and conditions ruled out: Secondary | ICD-10-CM | POA: Diagnosis not present

## 2021-06-11 DIAGNOSIS — Z01419 Encounter for gynecological examination (general) (routine) without abnormal findings: Secondary | ICD-10-CM | POA: Diagnosis not present

## 2021-06-11 DIAGNOSIS — Z780 Asymptomatic menopausal state: Secondary | ICD-10-CM | POA: Diagnosis not present

## 2021-06-25 ENCOUNTER — Other Ambulatory Visit (HOSPITAL_COMMUNITY): Payer: Self-pay

## 2021-07-01 ENCOUNTER — Ambulatory Visit: Payer: 59 | Admitting: Family Medicine

## 2021-07-11 ENCOUNTER — Ambulatory Visit: Payer: 59 | Admitting: Family Medicine

## 2021-07-11 ENCOUNTER — Other Ambulatory Visit: Payer: Self-pay

## 2021-07-11 ENCOUNTER — Other Ambulatory Visit (HOSPITAL_COMMUNITY): Payer: Self-pay

## 2021-07-11 ENCOUNTER — Encounter: Payer: Self-pay | Admitting: Family Medicine

## 2021-07-11 VITALS — BP 106/69 | HR 86 | Ht 63.5 in | Wt 216.0 lb

## 2021-07-11 DIAGNOSIS — E669 Obesity, unspecified: Secondary | ICD-10-CM

## 2021-07-11 DIAGNOSIS — E1169 Type 2 diabetes mellitus with other specified complication: Secondary | ICD-10-CM

## 2021-07-11 DIAGNOSIS — E782 Mixed hyperlipidemia: Secondary | ICD-10-CM

## 2021-07-11 DIAGNOSIS — E1165 Type 2 diabetes mellitus with hyperglycemia: Secondary | ICD-10-CM | POA: Diagnosis not present

## 2021-07-11 DIAGNOSIS — I10 Essential (primary) hypertension: Secondary | ICD-10-CM | POA: Diagnosis not present

## 2021-07-11 LAB — POCT GLYCOSYLATED HEMOGLOBIN (HGB A1C): HbA1c, POC (controlled diabetic range): 10.6 % — AB (ref 0.0–7.0)

## 2021-07-11 MED ORDER — TIRZEPATIDE 2.5 MG/0.5ML ~~LOC~~ SOAJ
2.5000 mg | SUBCUTANEOUS | 0 refills | Status: DC
  Filled 2021-07-11: qty 2, 28d supply, fill #0

## 2021-07-11 MED ORDER — TIRZEPATIDE 5 MG/0.5ML ~~LOC~~ SOAJ
5.0000 mg | SUBCUTANEOUS | 0 refills | Status: DC
  Filled 2021-07-11: qty 2, 28d supply, fill #0

## 2021-07-11 MED ORDER — TIRZEPATIDE 7.5 MG/0.5ML ~~LOC~~ SOAJ
7.5000 mg | SUBCUTANEOUS | 0 refills | Status: DC
  Filled 2021-07-11: qty 2, 28d supply, fill #0

## 2021-07-11 NOTE — Patient Instructions (Addendum)
Start mounjaro 2.5mg  weekly x1 month, then increase to 5mg  x1 month, then 7.5mg .   Follow up with me in 3-4 months.

## 2021-07-13 NOTE — Assessment & Plan Note (Signed)
Continues to tolerate simvastatin well.  Recommend continuation.

## 2021-07-13 NOTE — Progress Notes (Signed)
Angela Casey - 61 y.o. female MRN [id number]  Date of birth: 01/19/1961  Subjective Chief Complaint  Patient presents with   Diabetes    HPI Angela Casey is a 61 year old female here today for follow-up visit.  Reports that she feels well today.  She remains concerned about control of her diabetes.  She admits that diet was not very good over the holidays.  She has been unable to increase Rybelsus due to diarrhea.  She had the opposite problem with Trulicity.  Sh this is any better.  e is doing well with metformin, glipizide and Jardiance.  She would be willing to try another GLP-1 to see if she tolerates this any better.  Continues to tolerate simvastatin well for management of associated hyperlipidemia.  Blood pressure remains well controlled with losartan 50 mg.  ROS:  A comprehensive ROS was completed and negative except as noted per HPI  Allergies  Allergen Reactions   Livalo [Pitavastatin] Itching, Rash and Hives   Exenatide Nausea And Vomiting    diarrhea   Pollen Extract     Other reaction(s): sneezing   Januvia [Sitagliptin] Diarrhea    Past Medical History:  Diagnosis Date   Allergy    Arthritis    knees   Asthma    asthmatic bronchitis   Diabetes mellitus without complication (HCC)    GERD (gastroesophageal reflux disease)    Hypercholesteremia     Past Surgical History:  Procedure Laterality Date   ANTERIOR CRUCIATE LIGAMENT REPAIR Right    BACK SURGERY     L5-S1   CARPAL TUNNEL RELEASE     DILATION AND CURETTAGE, DIAGNOSTIC / THERAPEUTIC     KNEE ARTHROSCOPY Left 06/28/2015   Procedure: Left Knee Arthroscopy and Debridement;  Surgeon: Meredith Pel, MD;  Location: [place];  Service: Orthopedics;  Laterality: Left;   KNEE ARTHROSCOPY WITH MEDIAL MENISECTOMY Left 06/28/2015   Procedure: KNEE ARTHROSCOPY WITH MEDIAL MENISECTOMY;  Surgeon: Meredith Pel, MD;  Location: [place];  Service: Orthopedics;  Laterality: Left;    KNEE SURGERY      Social History   Socioeconomic History   Marital status: Divorced    Spouse name: Not on file   Number of children: 1   Years of education: Not on file   Highest education level: Not on file  Occupational History   Not on file  Tobacco Use   Smoking status: Never   Smokeless tobacco: Never  Vaping Use   Vaping Use: Never used  Substance and Sexual Activity   Alcohol use: Yes    Alcohol/week: 0.0 - 1.0 standard drinks    Comment: social   Drug use: No   Sexual activity: Not Currently    Partners: Male    Birth control/protection: Condom  Other Topics Concern   Not on file  Social History Narrative   Not on file   Social Determinants of Health   Financial Resource Strain: Not on file  Food Insecurity: Not on file  Transportation Needs: Not on file  Physical Activity: Not on file  Stress: Not on file  Social Connections: Not on file    Family History  Problem Relation Age of Onset   Cancer Mother        uterine   Diabetes Father    COPD Father    Asthma Father    Diabetes Paternal Grandfather    Breast cancer Paternal Grandmother        in 7's  Heart attack Paternal Grandmother    Diabetes Paternal Grandmother    Hypertension Maternal Grandmother    Heart attack Maternal Grandmother    Allergic rhinitis Neg Hx    Angioedema Neg Hx    Atopy Neg Hx    Eczema Neg Hx    Immunodeficiency Neg Hx    Urticaria Neg Hx     Health Maintenance  Topic Date Due   HIV Screening  Never done   Hepatitis C Screening  Never done   PAP SMEAR-Modifier  Never done   Zoster Vaccines- Shingrix (1 of 2) Never done   COVID-19 Vaccine (4 - Booster for Pfizer series) 05/24/2020   MAMMOGRAM  05/23/2021   TETANUS/TDAP  09/24/2021 (Originally 04/17/1981)   HEMOGLOBIN A1C  01/08/2022   FOOT EXAM  03/10/2022   OPHTHALMOLOGY EXAM  04/21/2022   COLONOSCOPY (Pts 45-85yrs Insurance coverage will need to be confirmed)  11/25/2024   INFLUENZA VACCINE   Completed   HPV VACCINES  Aged Out     ----------------------------------------------------------------------------------------------------------------------------------------------------------------------------------------------------------------- Physical Exam BP 106/69 (BP Location: Right Arm, Patient Position: Sitting, Cuff Size: Large)    Pulse 86    Ht 5' 3.5" (1.613 m)    Wt 216 lb (98 kg)    LMP 06/08/2010 (Within Months)    SpO2 98%    BMI 37.66 kg/m   Physical Exam Constitutional:      Appearance: Normal appearance.  HENT:     Head: Normocephalic and atraumatic.  Eyes:     General: No scleral icterus. Cardiovascular:     Rate and Rhythm: Normal rate and regular rhythm.  Pulmonary:     Effort: Pulmonary effort is normal.     Breath sounds: Normal breath sounds.  Musculoskeletal:     Cervical back: Neck supple.  Neurological:     Mental Status: She is alert.  Psychiatric:        Mood and Affect: Mood normal.        Behavior: Behavior normal.    ------------------------------------------------------------------------------------------------------------------------------------------------------------------------------------------------------------------- Assessment and Plan  Type 2 diabetes mellitus with obesity (HCC) A1c has improved some today however remains elevated.  Adding Mounjaro to see if this results in better blood sugar control and weight loss as well as better tolerance.  Continue current medications of glipizide, metformin and Jardiance.  Essential hypertension Blood pressure remains well controlled.  Continue losartan at current strength.  Hyperlipidemia Continues to tolerate simvastatin well.  Recommend continuation.   Meds ordered this encounter  Medications   tirzepatide (MOUNJARO) 2.5 MG/0.5ML Pen    Sig: Inject 2.5 mg into the skin once a week.    Dispense:  2 mL    Refill:  0   tirzepatide (MOUNJARO) 5 MG/0.5ML Pen    Sig: Inject 5 mg into  the skin once a week.    Dispense:  2 mL    Refill:  0   tirzepatide (MOUNJARO) 7.5 MG/0.5ML Pen    Sig: Inject 7.5 mg into the skin once a week.    Dispense:  6 mL    Refill:  0    Return in about 3 months (around 10/08/2021).    This visit occurred during the SARS-CoV-2 public health emergency.  Safety protocols were in place, including screening questions prior to the visit, additional usage of staff PPE, and extensive cleaning of exam room while observing appropriate contact time as indicated for disinfecting solutions.

## 2021-07-13 NOTE — Assessment & Plan Note (Signed)
A1c has improved some today however remains elevated.  Adding Mounjaro to see if this results in better blood sugar control and weight loss as well as better tolerance.  Continue current medications of glipizide, metformin and Jardiance.

## 2021-07-13 NOTE — Assessment & Plan Note (Signed)
Blood pressure remains well controlled.  Continue losartan at current strength.

## 2021-07-21 ENCOUNTER — Other Ambulatory Visit (HOSPITAL_COMMUNITY): Payer: Self-pay

## 2021-08-05 ENCOUNTER — Encounter: Payer: Self-pay | Admitting: Family Medicine

## 2021-08-20 ENCOUNTER — Other Ambulatory Visit: Payer: Self-pay | Admitting: Family Medicine

## 2021-08-20 ENCOUNTER — Other Ambulatory Visit (HOSPITAL_COMMUNITY): Payer: Self-pay

## 2021-08-20 MED ORDER — SIMVASTATIN 20 MG PO TABS
20.0000 mg | ORAL_TABLET | Freq: Every day | ORAL | 3 refills | Status: DC
  Filled 2021-08-20: qty 90, 90d supply, fill #0
  Filled 2021-11-14: qty 90, 90d supply, fill #1
  Filled 2021-12-02 – 2022-02-06 (×2): qty 90, 90d supply, fill #2
  Filled 2022-05-06: qty 90, 90d supply, fill #3

## 2021-08-21 ENCOUNTER — Other Ambulatory Visit (HOSPITAL_COMMUNITY): Payer: Self-pay

## 2021-08-21 MED ORDER — TIRZEPATIDE 7.5 MG/0.5ML ~~LOC~~ SOAJ
7.5000 mg | SUBCUTANEOUS | 0 refills | Status: DC
  Filled 2021-08-21: qty 6, 84d supply, fill #0

## 2021-08-25 ENCOUNTER — Other Ambulatory Visit (HOSPITAL_COMMUNITY): Payer: Self-pay

## 2021-08-25 NOTE — Addendum Note (Signed)
Addended by: Narda Rutherford on: 08/25/2021 01:26 PM ? ? Modules accepted: Orders ? ?

## 2021-08-26 ENCOUNTER — Other Ambulatory Visit (HOSPITAL_COMMUNITY): Payer: Self-pay

## 2021-08-26 MED ORDER — TIRZEPATIDE 2.5 MG/0.5ML ~~LOC~~ SOAJ
2.5000 mg | SUBCUTANEOUS | 2 refills | Status: DC
  Filled 2021-08-26 – 2021-09-04 (×2): qty 2, 28d supply, fill #0

## 2021-08-26 NOTE — Addendum Note (Signed)
Addended by: Peggye Ley on: 08/26/2021 03:48 PM ? ? Modules accepted: Orders ? ?

## 2021-09-01 ENCOUNTER — Other Ambulatory Visit (HOSPITAL_COMMUNITY): Payer: Self-pay

## 2021-09-04 ENCOUNTER — Other Ambulatory Visit (HOSPITAL_COMMUNITY): Payer: Self-pay

## 2021-09-08 ENCOUNTER — Other Ambulatory Visit (HOSPITAL_COMMUNITY): Payer: Self-pay

## 2021-09-08 ENCOUNTER — Ambulatory Visit: Payer: 59 | Admitting: Podiatry

## 2021-09-08 DIAGNOSIS — G5761 Lesion of plantar nerve, right lower limb: Secondary | ICD-10-CM

## 2021-09-08 MED ORDER — METHYLPREDNISOLONE 4 MG PO TBPK
ORAL_TABLET | ORAL | 0 refills | Status: DC
  Filled 2021-09-08 – 2021-09-18 (×2): qty 21, 6d supply, fill #0

## 2021-09-08 MED ORDER — BETAMETHASONE SOD PHOS & ACET 6 (3-3) MG/ML IJ SUSP
3.0000 mg | Freq: Once | INTRAMUSCULAR | Status: AC
  Administered 2021-09-08: 3 mg via INTRA_ARTICULAR

## 2021-09-08 MED ORDER — MELOXICAM 15 MG PO TABS
15.0000 mg | ORAL_TABLET | Freq: Every day | ORAL | 1 refills | Status: DC
  Filled 2021-09-08 – 2021-09-18 (×2): qty 30, 30d supply, fill #0
  Filled 2021-12-02: qty 30, 30d supply, fill #1

## 2021-09-08 NOTE — Progress Notes (Signed)
? ?  HPI: 61 y.o. female presenting today for recurrence of right forefoot pain secondary to a Morton's neuroma.  Patient states that the last injection she received lasted for about 2 years.  The pain is slowly returned.  She presents for further treatment and evaluation ? ?Past Medical History:  ?Diagnosis Date  ? Allergy   ? Arthritis   ? knees  ? Asthma   ? asthmatic bronchitis  ? Diabetes mellitus without complication ([facility])   ? GERD (gastroesophageal reflux disease)   ? Hypercholesteremia   ? ?  ?Physical Exam: ?General: The patient is alert and oriented x3 in no acute distress. ? ?Dermatology: Skin is warm, dry and supple bilateral lower extremities. Negative for open lesions or macerations. ? ?Vascular: Palpable pedal pulses bilaterally. No edema or erythema noted. Capillary refill within normal limits. ? ?Neurological: Epicritic and protective threshold grossly intact bilaterally.  ? ?Musculoskeletal Exam: Range of motion within normal limits to all pedal and ankle joints bilateral. Muscle strength 5/5 in all groups bilateral.  There is pain on palpation to the third interspace right foot with a positive Mulder sign.  Pain with lateral compression of the metatarsal heads also noted. ? ?Assessment: ?1.  Morton's neuroma right third interspace ? ? ?Plan of Care:  ?1. Patient evaluated.  ?2.  Injection 0.5 cc Celestone Soluspan injected into the third interspace right foot ?3.  Prescription for a Medrol Dosepak ?4.  Prescription for meloxicam 15 mg daily after completion of the Dosepak ?5.  Recommend wide fitting shoes that do not compress the forefoot or toebox area ?6.  Return to clinic as needed ? ?  ?  ?Edrick Kins, DPM ?[facility] ? ?Dr. Edrick Kins, DPM  ?  ?[address]                                        ?[place], [place] [address]                ?Office [phone]  ?Fax [phone] ? ? ? ? ?

## 2021-09-16 ENCOUNTER — Other Ambulatory Visit (HOSPITAL_COMMUNITY): Payer: Self-pay

## 2021-09-18 ENCOUNTER — Other Ambulatory Visit (HOSPITAL_COMMUNITY): Payer: Self-pay

## 2021-09-21 ENCOUNTER — Other Ambulatory Visit: Payer: Self-pay | Admitting: Family Medicine

## 2021-09-23 ENCOUNTER — Other Ambulatory Visit (HOSPITAL_COMMUNITY): Payer: Self-pay

## 2021-09-23 MED ORDER — METFORMIN HCL ER 500 MG PO TB24
2000.0000 mg | ORAL_TABLET | Freq: Every day | ORAL | 1 refills | Status: DC
  Filled 2021-09-23: qty 360, 90d supply, fill #0
  Filled 2021-11-14 – 2021-12-02 (×2): qty 360, 90d supply, fill #1

## 2021-10-06 ENCOUNTER — Other Ambulatory Visit (HOSPITAL_COMMUNITY): Payer: Self-pay

## 2021-10-08 ENCOUNTER — Ambulatory Visit: Payer: 59 | Admitting: Family Medicine

## 2021-10-08 ENCOUNTER — Other Ambulatory Visit (HOSPITAL_COMMUNITY): Payer: Self-pay

## 2021-10-08 ENCOUNTER — Other Ambulatory Visit: Payer: Self-pay

## 2021-10-08 ENCOUNTER — Encounter: Payer: Self-pay | Admitting: Family Medicine

## 2021-10-08 VITALS — BP 131/86 | HR 80 | Ht 63.5 in | Wt 211.0 lb

## 2021-10-08 DIAGNOSIS — E669 Obesity, unspecified: Secondary | ICD-10-CM

## 2021-10-08 DIAGNOSIS — Z23 Encounter for immunization: Secondary | ICD-10-CM | POA: Diagnosis not present

## 2021-10-08 DIAGNOSIS — E1169 Type 2 diabetes mellitus with other specified complication: Secondary | ICD-10-CM | POA: Diagnosis not present

## 2021-10-08 LAB — POCT GLYCOSYLATED HEMOGLOBIN (HGB A1C): HbA1c, POC (controlled diabetic range): 8.5 % — AB (ref 0.0–7.0)

## 2021-10-08 MED ORDER — TIRZEPATIDE 5 MG/0.5ML ~~LOC~~ SOAJ
5.0000 mg | SUBCUTANEOUS | 1 refills | Status: DC
  Filled 2021-10-08: qty 2, 28d supply, fill #0
  Filled 2021-11-13: qty 2, 28d supply, fill #1

## 2021-10-08 NOTE — Assessment & Plan Note (Signed)
A1c improved to 8.5% today.  Increase mounjaro to '5mg'$ .  Can increase further to 7.'5mg'$  if tolerating well.  Return in about 3 months (around 01/08/2022) for T2DM. ? ?

## 2021-10-08 NOTE — Patient Instructions (Signed)
Great to see you today! ?Increase mounjaro to '5mg'$  weekly.   ?If you tolerate this well after a month increase to 7.'5mg'$ .   ?

## 2021-10-08 NOTE — Progress Notes (Signed)
?Angela Casey - 61 y.o. female MRN [id number]  Date of birth: December 29, 1960 ? ?Subjective ?No chief complaint on file. ? ? ?HPI ?Angela Casey is a 61 y.o. female here today for follow up visit.  Started on mounjaro at previous visit.  This was a little rough starting as she had some nausea initially.  She is now tolerating this much better.  Dietary changes seems to have helped as well.  She does continue to play golf frequently. Recently returned from a trip in [place].   ? ?ROS:  A comprehensive ROS was completed and negative except as noted per HPI ? ?Allergies  ?Allergen Reactions  ? Livalo [Pitavastatin] Itching, Rash and Hives  ? Exenatide Nausea And Vomiting  ?  diarrhea  ? Pollen Extract   ?  Other reaction(s): sneezing  ? Januvia [Sitagliptin] Diarrhea  ? ? ?Past Medical History:  ?Diagnosis Date  ? Allergy   ? Arthritis   ? knees  ? Asthma   ? asthmatic bronchitis  ? Diabetes mellitus without complication ([facility])   ? GERD (gastroesophageal reflux disease)   ? Hypercholesteremia   ? ? ?Past Surgical History:  ?Procedure Laterality Date  ? ANTERIOR CRUCIATE LIGAMENT REPAIR Right   ? BACK SURGERY    ? L5-S1  ? CARPAL TUNNEL RELEASE    ? DILATION AND CURETTAGE, DIAGNOSTIC / THERAPEUTIC    ? KNEE ARTHROSCOPY Left 06/28/2015  ? Procedure: Left Knee Arthroscopy and Debridement;  Surgeon: Meredith Pel, MD;  Location: [facility];  Service: Orthopedics;  Laterality: Left;  ? KNEE ARTHROSCOPY WITH MEDIAL MENISECTOMY Left 06/28/2015  ? Procedure: KNEE ARTHROSCOPY WITH MEDIAL MENISECTOMY;  Surgeon: Meredith Pel, MD;  Location: [facility];  Service: Orthopedics;  Laterality: Left;  ? KNEE SURGERY    ? ? ?Social History  ? ?Socioeconomic History  ? Marital status: Divorced  ?  Spouse name: Not on file  ? Number of children: 1  ? Years of education: Not on file  ? Highest education level: Not on file  ?Occupational History  ? Not on file  ?Tobacco Use  ? Smoking status: Never  ? Smokeless  tobacco: Never  ?Vaping Use  ? Vaping Use: Never used  ?Substance and Sexual Activity  ? Alcohol use: Yes  ?  Alcohol/week: 0.0 - 1.0 standard drinks  ?  Comment: social  ? Drug use: No  ? Sexual activity: Not Currently  ?  Partners: Male  ?  Birth control/protection: Condom  ?Other Topics Concern  ? Not on file  ?Social History Narrative  ? Not on file  ? ?Social Determinants of Health  ? ?Financial Resource Strain: Not on file  ?Food Insecurity: Not on file  ?Transportation Needs: Not on file  ?Physical Activity: Not on file  ?Stress: Not on file  ?Social Connections: Not on file  ? ? ?Family History  ?Problem Relation Age of Onset  ? Cancer Mother   ?     uterine  ? Diabetes Father   ? COPD Father   ? Asthma Father   ? Diabetes Paternal Grandfather   ? Breast cancer Paternal Grandmother   ?     in 25's  ? Heart attack Paternal Grandmother   ? Diabetes Paternal Grandmother   ? Hypertension Maternal Grandmother   ? Heart attack Maternal Grandmother   ? Allergic rhinitis Neg Hx   ? Angioedema Neg Hx   ? Atopy Neg Hx   ? Eczema Neg  Hx   ? Immunodeficiency Neg Hx   ? Urticaria Neg Hx   ? ? ?Health Maintenance  ?Topic Date Due  ? HIV Screening  Never done  ? Hepatitis C Screening  Never done  ? TETANUS/TDAP  Never done  ? PAP SMEAR-Modifier  Never done  ? Zoster Vaccines- Shingrix (1 of 2) Never done  ? COVID-19 Vaccine (4 - Booster for Pfizer series) 05/24/2020  ? MAMMOGRAM  05/23/2021  ? INFLUENZA VACCINE  01/06/2022  ? HEMOGLOBIN A1C  01/08/2022  ? FOOT EXAM  03/10/2022  ? OPHTHALMOLOGY EXAM  04/21/2022  ? COLONOSCOPY (Pts 45-72yr Insurance coverage will need to be confirmed)  11/25/2024  ? HPV VACCINES  Aged Out  ? ? ? ?----------------------------------------------------------------------------------------------------------------------------------------------------------------------------------------------------------------- ?Physical Exam ?BP 131/86 (BP Location: Left Arm, Patient Position: Sitting, Cuff Size:  Large)   Pulse 80   Ht 5' 3.5" (1.613 m)   Wt 211 lb (95.7 kg)   LMP 06/08/2010 (Within Months)   SpO2 99%   BMI 36.79 kg/m?  ? ?Physical Exam ?Constitutional:   ?   Appearance: Normal appearance.  ?Neurological:  ?   Mental Status: She is alert.  ?Psychiatric:     ?   Mood and Affect: Mood normal.     ?   Behavior: Behavior normal.  ? ? ?------------------------------------------------------------------------------------------------------------------------------------------------------------------------------------------------------------------- ?Assessment and Plan ? ?Type 2 diabetes mellitus with obesity ([facility] ?A1c improved to 8.5% today.  Increase mounjaro to '5mg'$ .  Can increase further to 7.'5mg'$  if tolerating well.  Return in about 3 months (around 01/08/2022) for T2DM. ? ? ?No orders of the defined types were placed in this encounter. ? ? ?No follow-ups on file. ? ? ? ?This visit occurred during the SARS-CoV-2 public health emergency.  Safety protocols were in place, including screening questions prior to the visit, additional usage of staff PPE, and extensive cleaning of exam room while observing appropriate contact time as indicated for disinfecting solutions.  ? ?

## 2021-10-14 ENCOUNTER — Encounter: Payer: Self-pay | Admitting: Family Medicine

## 2021-10-14 ENCOUNTER — Other Ambulatory Visit (HOSPITAL_COMMUNITY): Payer: Self-pay

## 2021-10-14 ENCOUNTER — Telehealth: Payer: 59 | Admitting: Family Medicine

## 2021-10-14 DIAGNOSIS — L719 Rosacea, unspecified: Secondary | ICD-10-CM | POA: Diagnosis not present

## 2021-10-14 MED ORDER — METRONIDAZOLE 1 % EX GEL
Freq: Every day | CUTANEOUS | 0 refills | Status: DC
  Filled 2021-10-14: qty 60, 30d supply, fill #0

## 2021-10-14 NOTE — Progress Notes (Signed)
E visit for Rosacea ?We are sorry that you are not feeling well. Here is how we plan to help! ?Based on what you shared with me it looks like you have Rosacea.  Rosacea is a common chronic skin condition that usually only affects the face and eyes.  Occasionally, the neck, chest, or other areas may be involved.  Characterized by redness, pimples, and broken blood vessels, rosacea tends to begin after middle age (between the ages of 5 and 80).  It is more common in fair-skinned people and women in menopause. It may appear differently in dark skinned people but rosacea effects all ethnic groups. ? ?The cause of rosacea is not fully understood. We do know that rosacea is worsened by various trigger factors including, spicy or hot foods, hot beverages such as coffee or tea, alcohol, and sun exposure just to name a few. ? ?Signs of rosacea may vary greatly from person to person. In some individuals it may only flare up from time to time. ? ? ?I have prescribed: ?A topical antibiotic called Metronidazole 1%.  Apply a thin film to affected area once daily.  Wash your hands before and after use. Make sure your skin is clean and dry.  Rub in gently and completely over the affected areas. ? ? ?HOME CARE: ?Keep a record of triggers, such as stress, weather, or certain foods or drinks. Consider limiting hot or spicy foods and alcohol. ?Always use sunscreen that protects against UVA and UVB rays and has a sun-protecting factor (SPF) of 15 or higher. ?Avoid putting steroids on the skin sores. Steroids may make rosacea worse.  ?You may use small amounts of water based cosmetic while using this medication.  Apply cosmetics after cream has dried. ?If you shave your face, use an electric razor ?Don?t scrub your skin or use sponges, brushes, or other abrasive tools. Doing so can irritate your skin. ?GET HELP RIGHT AWAY IF: ?If your rosacea gets worse or is not better within 4 weeks. ?If a new skin condition or rash develops. ?Loss of  feeling or tingling of treated area ?Nausea ? ?MAKE SURE YOU  ? ?Understand these instructions. ?Will watch your condition. ?Will get help right away if you are not doing well or get worse. ? ? ?Thank you for choosing an e-visit. ? ?Your e-visit answers were reviewed by a board certified advanced clinical practitioner to complete your personal care plan. Depending upon the condition, your plan could have included both over the counter or prescription medications. ? ?Please review your pharmacy choice. Make sure the pharmacy is open so you can pick up prescription now. If there is a problem, you may contact your provider through [employer] and have the prescription routed to another pharmacy.  Your safety is important to [place]. If you have drug allergies check your prescription carefully.  ? ?For the next 24 hours you can use MyChart to ask questions about today's visit, request a non-urgent call back, or ask for a work or school excuse. ?You will get an email in the next two days asking about your experience. I hope that your e-visit has been valuable and will speed your recovery. ? ?I provided 5 minutes of non face-to-face time during this encounter for chart review, medication and order placement, as well as and documentation.  ? ?

## 2021-10-15 ENCOUNTER — Other Ambulatory Visit (HOSPITAL_COMMUNITY): Payer: Self-pay

## 2021-11-13 ENCOUNTER — Other Ambulatory Visit: Payer: Self-pay | Admitting: Family Medicine

## 2021-11-13 ENCOUNTER — Other Ambulatory Visit (HOSPITAL_COMMUNITY): Payer: Self-pay

## 2021-11-13 DIAGNOSIS — E782 Mixed hyperlipidemia: Secondary | ICD-10-CM

## 2021-11-13 MED ORDER — LOSARTAN POTASSIUM 50 MG PO TABS
50.0000 mg | ORAL_TABLET | Freq: Every day | ORAL | 1 refills | Status: DC
  Filled 2021-11-13: qty 90, 90d supply, fill #0
  Filled 2021-12-02 – 2022-02-06 (×2): qty 90, 90d supply, fill #1

## 2021-11-14 ENCOUNTER — Other Ambulatory Visit (HOSPITAL_COMMUNITY): Payer: Self-pay

## 2021-11-28 DIAGNOSIS — R922 Inconclusive mammogram: Secondary | ICD-10-CM | POA: Diagnosis not present

## 2021-11-28 DIAGNOSIS — R920 Mammographic microcalcification found on diagnostic imaging of breast: Secondary | ICD-10-CM | POA: Diagnosis not present

## 2021-11-28 LAB — HM MAMMOGRAPHY

## 2021-12-02 ENCOUNTER — Other Ambulatory Visit: Payer: Self-pay | Admitting: Family Medicine

## 2021-12-02 ENCOUNTER — Other Ambulatory Visit (HOSPITAL_COMMUNITY): Payer: Self-pay

## 2021-12-02 ENCOUNTER — Encounter (INDEPENDENT_AMBULATORY_CARE_PROVIDER_SITE_OTHER): Payer: 59 | Admitting: Family Medicine

## 2021-12-02 DIAGNOSIS — E1169 Type 2 diabetes mellitus with other specified complication: Secondary | ICD-10-CM | POA: Diagnosis not present

## 2021-12-02 DIAGNOSIS — Z6841 Body Mass Index (BMI) 40.0 and over, adult: Secondary | ICD-10-CM | POA: Diagnosis not present

## 2021-12-02 DIAGNOSIS — E1165 Type 2 diabetes mellitus with hyperglycemia: Secondary | ICD-10-CM

## 2021-12-02 MED ORDER — EMPAGLIFLOZIN 25 MG PO TABS
25.0000 mg | ORAL_TABLET | Freq: Every day | ORAL | 1 refills | Status: DC
  Filled 2021-12-02 – 2022-01-05 (×2): qty 90, 90d supply, fill #0
  Filled 2022-05-06: qty 90, 90d supply, fill #1

## 2021-12-03 ENCOUNTER — Other Ambulatory Visit (HOSPITAL_COMMUNITY): Payer: Self-pay

## 2021-12-03 MED ORDER — ONDANSETRON 8 MG PO TBDP
8.0000 mg | ORAL_TABLET | Freq: Three times a day (TID) | ORAL | 3 refills | Status: DC | PRN
  Filled 2021-12-03: qty 20, 7d supply, fill #0

## 2021-12-03 MED ORDER — TIRZEPATIDE 5 MG/0.5ML ~~LOC~~ SOAJ
5.0000 mg | SUBCUTANEOUS | 11 refills | Status: DC
  Filled 2021-12-03: qty 2, 28d supply, fill #0

## 2021-12-03 MED ORDER — POLYETHYLENE GLYCOL 3350 17 G PO PACK
17.0000 g | PACK | Freq: Two times a day (BID) | ORAL | 11 refills | Status: DC
  Filled 2021-12-03: qty 30, 15d supply, fill #0

## 2021-12-03 NOTE — Telephone Encounter (Signed)
I spent 5 total minutes of online digital evaluation and management services in this patient-initiated request for online care. 

## 2021-12-08 ENCOUNTER — Other Ambulatory Visit (HOSPITAL_COMMUNITY): Payer: Self-pay

## 2022-01-05 ENCOUNTER — Encounter: Payer: Self-pay | Admitting: Family Medicine

## 2022-01-05 ENCOUNTER — Other Ambulatory Visit (HOSPITAL_COMMUNITY): Payer: Self-pay

## 2022-01-15 ENCOUNTER — Ambulatory Visit: Payer: 59 | Admitting: Family Medicine

## 2022-01-21 ENCOUNTER — Other Ambulatory Visit: Payer: Self-pay | Admitting: Family Medicine

## 2022-01-21 ENCOUNTER — Other Ambulatory Visit (HOSPITAL_COMMUNITY): Payer: Self-pay

## 2022-01-22 ENCOUNTER — Other Ambulatory Visit (HOSPITAL_COMMUNITY): Payer: Self-pay

## 2022-01-23 ENCOUNTER — Other Ambulatory Visit (HOSPITAL_COMMUNITY): Payer: Self-pay

## 2022-01-23 MED ORDER — RYBELSUS 7 MG PO TABS
7.0000 mg | ORAL_TABLET | Freq: Every day | ORAL | 1 refills | Status: DC
  Filled 2022-01-23: qty 30, fill #0

## 2022-01-23 MED ORDER — RYBELSUS 3 MG PO TABS
3.0000 mg | ORAL_TABLET | Freq: Every day | ORAL | 0 refills | Status: DC
  Filled 2022-01-23 – 2022-02-04 (×2): qty 30, 30d supply, fill #0

## 2022-02-04 ENCOUNTER — Other Ambulatory Visit (HOSPITAL_COMMUNITY): Payer: Self-pay

## 2022-02-06 ENCOUNTER — Other Ambulatory Visit (HOSPITAL_COMMUNITY): Payer: Self-pay

## 2022-02-06 ENCOUNTER — Other Ambulatory Visit: Payer: Self-pay | Admitting: Family Medicine

## 2022-02-06 MED ORDER — METFORMIN HCL ER 500 MG PO TB24
2000.0000 mg | ORAL_TABLET | Freq: Every day | ORAL | 1 refills | Status: DC
  Filled 2022-02-06 – 2022-02-10 (×2): qty 360, 90d supply, fill #0
  Filled 2022-05-06 – 2022-06-24 (×3): qty 360, 90d supply, fill #1

## 2022-02-10 ENCOUNTER — Other Ambulatory Visit (HOSPITAL_COMMUNITY): Payer: Self-pay

## 2022-02-12 ENCOUNTER — Ambulatory Visit (INDEPENDENT_AMBULATORY_CARE_PROVIDER_SITE_OTHER): Payer: 59

## 2022-02-12 ENCOUNTER — Ambulatory Visit
Admission: RE | Admit: 2022-02-12 | Discharge: 2022-02-12 | Disposition: A | Discharge status: Home / Self Care | Payer: 59 | Source: Ambulatory Visit | Attending: Family Medicine | Admitting: Family Medicine

## 2022-02-12 ENCOUNTER — Other Ambulatory Visit (HOSPITAL_COMMUNITY): Payer: Self-pay

## 2022-02-12 VITALS — BP 117/75 | HR 96 | Temp 99.1°F | Resp 18 | Ht 64.0 in | Wt 211.0 lb

## 2022-02-12 DIAGNOSIS — S93401A Sprain of unspecified ligament of right ankle, initial encounter: Secondary | ICD-10-CM | POA: Diagnosis not present

## 2022-02-12 DIAGNOSIS — M25571 Pain in right ankle and joints of right foot: Secondary | ICD-10-CM

## 2022-02-12 DIAGNOSIS — S96911A Strain of unspecified muscle and tendon at ankle and foot level, right foot, initial encounter: Secondary | ICD-10-CM

## 2022-02-12 DIAGNOSIS — S99911A Unspecified injury of right ankle, initial encounter: Secondary | ICD-10-CM | POA: Diagnosis not present

## 2022-02-12 DIAGNOSIS — S82891A Other fracture of right lower leg, initial encounter for closed fracture: Secondary | ICD-10-CM | POA: Diagnosis not present

## 2022-02-12 DIAGNOSIS — M7989 Other specified soft tissue disorders: Secondary | ICD-10-CM | POA: Diagnosis not present

## 2022-02-12 MED ORDER — HYDROCODONE-ACETAMINOPHEN 5-325 MG PO TABS
1.0000 | ORAL_TABLET | Freq: Four times a day (QID) | ORAL | 0 refills | Status: DC | PRN
  Filled 2022-02-12: qty 12, 3d supply, fill #0

## 2022-02-12 MED ORDER — HYDROCODONE-ACETAMINOPHEN 5-325 MG PO TABS: 1.0000 | ORAL_TABLET | Freq: Four times a day (QID) | ORAL | 0 refills | Status: DC | PRN

## 2022-02-12 NOTE — ED Provider Notes (Signed)
[facility]    CSN: [id number] Arrival date & time: 02/12/22  7510      History   Chief Complaint Chief Complaint  Patient presents with   Ankle Pain    I was going down a flight of stairs on Tuesday night and missed the bottom 2 steps. I fell, spraining my right ankle. Cannot walk without a crutch. Swelling and tons of pain.  Have been elevating, icing, resting, and have on an ace bandage. - Entered by patient    HPI Angela Casey is a 61 y.o. female.   While walking down stairs two days ago, patient missed the last two steps and twisted her right ankle when contacting the floor.  She had immediate pain/swelling in her right ankle/foot that has persisted.  The history is provided by the patient.  Ankle Pain Location:  Ankle and foot Time since incident:  2 days Injury: yes   Mechanism of injury comment:  Twisted ankle Ankle location:  R ankle Foot location:  R foot Pain details:    Quality:  Aching   Radiates to:  Does not radiate   Severity:  Severe   Onset quality:  Sudden   Duration:  2 days   Timing:  Constant   Progression:  Unchanged Chronicity:  New Prior injury to area:  No Relieved by:  Nothing Worsened by:  Bearing weight Ineffective treatments:  Elevation, ice, compression and NSAIDs Associated symptoms: decreased ROM, stiffness and swelling   Associated symptoms: no back pain, no fatigue, no fever, no muscle weakness, no numbness and no tingling   Risk factors: obesity     Past Medical History:  Diagnosis Date   Allergy    Arthritis    knees   Asthma    asthmatic bronchitis   Diabetes mellitus without complication ([facility])    GERD (gastroesophageal reflux disease)    Hypercholesteremia     Patient Active Problem List   Diagnosis Date Noted   Essential hypertension 03/10/2021   Obesity (BMI 30-39.9) 02/25/2018   Perennial allergic rhinitis 08/06/2016   Hyperlipidemia 02/13/2016   RAD (reactive airway disease) 07/02/2013   BMI  40.0-44.9, adult ([facility]) 07/02/2013   Type 2 diabetes mellitus with obesity ([facility]) 06/29/2013   Asthma, mild intermittent 12/27/2012    Past Surgical History:  Procedure Laterality Date   ANTERIOR CRUCIATE LIGAMENT REPAIR Right    BACK SURGERY     L5-S1   CARPAL TUNNEL RELEASE     DILATION AND CURETTAGE, DIAGNOSTIC / THERAPEUTIC     KNEE ARTHROSCOPY Left 06/28/2015   Procedure: Left Knee Arthroscopy and Debridement;  Surgeon: Meredith Pel, MD;  Location: [facility];  Service: Orthopedics;  Laterality: Left;   KNEE ARTHROSCOPY WITH MEDIAL MENISECTOMY Left 06/28/2015   Procedure: KNEE ARTHROSCOPY WITH MEDIAL MENISECTOMY;  Surgeon: Meredith Pel, MD;  Location: [facility];  Service: Orthopedics;  Laterality: Left;   KNEE SURGERY      OB History   No obstetric history on file.      Home Medications    Prior to Admission medications   Medication Sig Start Date End Date Taking? Authorizing Provider  albuterol (VENTOLIN HFA) 108 (90 Base) MCG/ACT inhaler Inhale 1-2 puffs into the lungs every 4 (four) hours as needed for wheezing or shortness of breath (bronchospasm). 11/28/20  Yes Trixie Dredge, PA-C  Blood Glucose Monitoring Suppl (FREESTYLE LITE) DEVI Use to check blood sugar 3 times a day 08/30/19  Yes Gherghe,  Salena Saner, MD  empagliflozin (JARDIANCE) 25 MG TABS tablet Take 1 tablet (25 mg total) by mouth daily before breakfast. 12/02/21 12/02/22 Yes Zigmund Daniel, Einar Pheasant, DO  glipiZIDE (GLUCOTROL) 5 MG tablet TAKE 1 TABLET BY MOUTH BEFORE DINNER 03/19/21  Yes Luetta Nutting, DO  Lancets (FREESTYLE) lancets Use to check blood sugar 3 times a day 08/30/19  Yes Philemon Kingdom, MD  losartan (COZAAR) 50 MG tablet Take 1 tablet by mouth daily. 11/13/21 11/13/22 Yes Luetta Nutting, DO  meloxicam (MOBIC) 15 MG tablet Take 1 tablet (15 mg total) by mouth daily. 09/08/21  Yes Edrick Kins, DPM  metFORMIN (GLUCOPHAGE-XR) 500 MG 24 hr tablet Take 4 tablets by  mouth daily. 02/06/22 05/11/22 Yes Luetta Nutting, DO  metroNIDAZOLE (METROGEL) 1 % gel Apply topically daily. 10/14/21  Yes Perlie Mayo, NP  ondansetron (ZOFRAN-ODT) 8 MG disintegrating tablet Dissolve 1 tablet by mouth every 8 hours as needed for nausea. 12/03/21  Yes Silverio Decamp, MD  polyethylene glycol (MIRALAX / GLYCOLAX) 17 g packet Take 17 g by mouth 2 times daily until stooling regularly 12/03/21  Yes Silverio Decamp, MD  Semaglutide (RYBELSUS) 3 MG TABS Take 1 tablet (3 mg) by mouth daily. (Increase to '7mg'$  after 30 days.) 01/23/22  Yes Luetta Nutting, DO  Semaglutide (RYBELSUS) 7 MG TABS Take 1 tablet (7 mg) by mouth daily. 01/23/22  Yes Luetta Nutting, DO  simvastatin (ZOCOR) 20 MG tablet Take 1 tablet by mouth daily. 08/20/21 08/20/22 Yes Luetta Nutting, DO  fexofenadine (ALLEGRA ALLERGY) 180 MG tablet Take 1 tablet (180 mg total) by mouth daily for 15 days. 02/19/21 03/06/21  Eliezer Lofts, FNP  HYDROcodone-acetaminophen (NORCO/VICODIN) 5-325 MG tablet Take 1 tablet by mouth every 6 (six) hours as needed for moderate pain or severe pain. 02/12/22   Kandra Nicolas, MD    Family History Family History  Problem Relation Age of Onset   Cancer Mother        uterine   Diabetes Father    COPD Father    Asthma Father    Diabetes Paternal Grandfather    Breast cancer Paternal Grandmother        in 50's   Heart attack Paternal Grandmother    Diabetes Paternal Grandmother    Hypertension Maternal Grandmother    Heart attack Maternal Grandmother    Allergic rhinitis Neg Hx    Angioedema Neg Hx    Atopy Neg Hx    Eczema Neg Hx    Immunodeficiency Neg Hx    Urticaria Neg Hx     Social History Social History   Tobacco Use   Smoking status: Never   Smokeless tobacco: Never  Vaping Use   Vaping Use: Never used  Substance Use Topics   Alcohol use: Yes    Alcohol/week: 0.0 - 1.0 standard drinks of alcohol    Comment: social   Drug use: No     Allergies   Livalo  [pitavastatin], Exenatide, Pollen extract, and Januvia [sitagliptin]   Review of Systems Review of Systems  Constitutional:  Positive for activity change. Negative for chills, diaphoresis, fatigue and fever.  Musculoskeletal:  Positive for joint swelling and stiffness. Negative for back pain.  Skin:  Negative for color change and wound.  All other systems reviewed and are negative.    Physical Exam Triage Vital Signs ED Triage Vitals  Enc Vitals Group     BP 02/12/22 1004 117/75     Pulse Rate 02/12/22 1004 96  Resp 02/12/22 1004 18     Temp 02/12/22 1004 99.1 F (37.3 C)     Temp Source 02/12/22 1004 Oral     SpO2 02/12/22 1004 96 %     Weight 02/12/22 1007 210 lb 15.7 oz (95.7 kg)     Height 02/12/22 1007 '5\' 4"'$  (1.626 m)     Head Circumference --      Peak Flow --      Pain Score 02/12/22 1006 2     Pain Loc --      Pain Edu? --      Excl. in [place]? --    No data found.  Updated Vital Signs BP 117/75 (BP Location: Left Arm)   Pulse 96   Temp 99.1 F (37.3 C) (Oral)   Resp 18   Ht '5\' 4"'$  (1.626 m)   Wt 95.7 kg   LMP 06/08/2010 (Within Months)   SpO2 96%   BMI 36.21 kg/m   Visual Acuity Right Eye Distance:   Left Eye Distance:   Bilateral Distance:    Right Eye Near:   Left Eye Near:    Bilateral Near:     Physical Exam Vitals and nursing note reviewed.  Constitutional:      General: She is not in acute distress.    Appearance: She is not ill-appearing.  HENT:     Head: Atraumatic.  Eyes:     Pupils: Pupils are equal, round, and reactive to light.  Cardiovascular:     Rate and Rhythm: Normal rate.  Pulmonary:     Effort: Pulmonary effort is normal.  Musculoskeletal:        General: Swelling present.     Right ankle: Swelling present. No deformity, ecchymosis or lacerations. Tenderness present over the lateral malleolus. No base of 5th metatarsal or proximal fibula tenderness. Decreased range of motion.     Right Achilles Tendon: Normal.        Feet:     Comments: Right ankle:  Decreased range of motion.  Tenderness and swelling over both the medial and lateral malleoli.  Joint stable.  No tenderness over the base of the fifth metatarsal.  Distal neurovascular function is intact.   Skin:    General: Skin is warm and dry.  Neurological:     General: No focal deficit present.     Mental Status: She is alert.      UC Treatments / Results  Labs (all labs ordered are listed, but only abnormal results are displayed) Labs Reviewed - No data to display  EKG   Radiology DG Foot Complete Right  Result Date: 02/12/2022 CLINICAL DATA:  RIGHT ankle injury, rolled RIGHT foot/ankle 2 days ago going down stairs, pain mostly at ankle, swelling EXAM: RIGHT FOOT COMPLETE - 3+ VIEW COMPARISON:  None FINDINGS: Osseous mineralization normal. Minimal hallux valgus and bunion deformity at first metatarsal head medially. Joint spaces preserved. Tiny plantar calcaneal spur. Small calcific densities adjacent to tip of medial malleolus and medial margin of tarsal navicular, see ankle radiograph report. No additional fracture, dislocation, or bone destruction. IMPRESSION: No definite acute RIGHT foot osseous abnormalities. Electronically Signed   By: Lavonia Dana M.D.   On: 02/12/2022 10:43   DG Ankle Complete Right  Result Date: 02/12/2022 CLINICAL DATA:  RIGHT ankle injury, rolled RIGHT foot/ankle 2 days ago going down stairs, pain mostly at ankle, swelling EXAM: RIGHT ANKLE - COMPLETE 3+ VIEW COMPARISON:  None FINDINGS: Diffuse soft tissue swelling RIGHT ankle. Osseous mineralization  normal. Joint spaces preserved. Small calcific densities adjacent to the medial joint line and tip of medial malleolus, question avulsion fragments though uncertain if arising from talus or tip of medial malleolus. Tiny plantar calcaneal spur. Small non fused ossicle at dorsal margin of tarsal navicular. No additional fracture, dislocation, or bone destruction. IMPRESSION:  Suspicion of small avulsion fragments at the medial ankle joint though uncertain if represent avulsion fragments arising from the medial margin of the talus or the tip of the medial malleolus. Electronically Signed   By: Lavonia Dana M.D.   On: 02/12/2022 10:41    Procedures Procedures (including critical care time)  Medications Ordered in UC Medications - No data to display  Initial Impression / Assessment and Plan / UC Course  I have reviewed the triage vital signs and the nursing notes.  Pertinent labs & imaging results that were available during my care of the patient were reviewed by me and considered in my medical decision making (see chart for details).    Ace wrap applied.  Patient has her own cam walker and crutches.  Rx for Vicodin (#12, no refill). Controlled Substance Prescriptions I have consulted the [place] Controlled Substances Registry for this patient, and feel the risk/benefit ratio today is favorable for proceeding with this prescription for a controlled substance.  Followup with Dr. Aundria Mems ([facility]) in about one week.  Final Clinical Impressions(s) / UC Diagnoses   Final diagnoses:  Sprain and strain of right ankle  Closed avulsion fracture of right ankle, initial encounter     Discharge Instructions      Apply ice pack for 20 to 30 minutes, 3 to 4 times daily  Continue until pain and swelling decrease.  Elevate leg when possible.  Wear cam walker boot.  Continue using crutches.  May take meloxicam '15mg'$  once daily.  May take Tylenol '1000mg'$  once or twice daily.    ED Prescriptions     Medication Sig Dispense Auth. Provider   HYDROcodone-acetaminophen (NORCO/VICODIN) 5-325 MG tablet  (Status: Discontinued) Take 1 tablet by mouth every 6 (six) hours as needed for moderate pain or severe pain. 12 tablet Kandra Nicolas, MD   HYDROcodone-acetaminophen (NORCO/VICODIN) 5-325 MG tablet Take 1 tablet by mouth every 6 (six) hours as needed for  moderate pain or severe pain. 12 tablet Kandra Nicolas, MD         Kandra Nicolas, MD 02/14/22 [phone]

## 2022-02-12 NOTE — ED Triage Notes (Signed)
Patient states that she missed 2 steps going down to the basement x 2 days ago.  Patient is having some swelling.  Patient has been elevating foot, applied compression, taken Advil.

## 2022-02-12 NOTE — Discharge Instructions (Signed)
Apply ice pack for 20 to 30 minutes, 3 to 4 times daily  Continue until pain and swelling decrease.  Elevate leg when possible.  Wear cam walker boot.  Continue using crutches.  May take meloxicam '15mg'$  once daily.  May take Tylenol '1000mg'$  once or twice daily.

## 2022-02-16 ENCOUNTER — Ambulatory Visit: Payer: 59 | Admitting: Family Medicine

## 2022-02-19 ENCOUNTER — Other Ambulatory Visit (HOSPITAL_COMMUNITY): Payer: Self-pay

## 2022-02-25 ENCOUNTER — Ambulatory Visit (INDEPENDENT_AMBULATORY_CARE_PROVIDER_SITE_OTHER): Payer: 59 | Admitting: Sports Medicine

## 2022-02-25 ENCOUNTER — Other Ambulatory Visit (HOSPITAL_COMMUNITY): Payer: Self-pay

## 2022-02-25 DIAGNOSIS — S8251XA Displaced fracture of medial malleolus of right tibia, initial encounter for closed fracture: Secondary | ICD-10-CM | POA: Diagnosis not present

## 2022-02-25 MED ORDER — IBUPROFEN 800 MG PO TABS
800.0000 mg | ORAL_TABLET | Freq: Three times a day (TID) | ORAL | 2 refills | Status: DC | PRN
Start: 2022-02-25 — End: 2022-09-11
  Filled 2022-02-25: qty 90, 30d supply, fill #0

## 2022-02-25 MED ORDER — CALCIUM CARB-CHOLECALCIFEROL 600-10 MG-MCG PO TABS
1.0000 | ORAL_TABLET | Freq: Two times a day (BID) | ORAL | 3 refills | Status: DC
  Filled 2022-02-25: qty 180, fill #0
  Filled 2022-02-26: qty 150, 75d supply, fill #0
  Filled 2022-08-05: qty 150, 75d supply, fill #1

## 2022-02-25 NOTE — Progress Notes (Signed)
    Procedures performed today:    None.  Independent interpretation of notes and tests performed by another provider:   X-rays personally reviewed, I do see a medial malleolus avulsion.  Brief History, Exam, Impression, and Recommendations:    Avulsion fracture of medial malleolus, right, closed, initial encounter This is a 62 year old female, recent inversion injury, x-rays did show an avulsion from the medial malleolus, she is tender in this location. We discussed boot immobilization for 4 to 6 weeks followed by ASO. Minimize weightbearing for now, she can use ibuprofen and then hydrocodone prescribed in the urgent care for breakthrough pain, calcium and vitamin D supplementation given. Return to see me in 4 weeks.    ____________________________________________ Gwen Her. Dianah Field, M.D., ABFM., CAQSM., AME. Primary Care and Sports Medicine [place] [facility]  Adjunct Professor of [employer]  Risk manager

## 2022-02-25 NOTE — Assessment & Plan Note (Signed)
This is a 61 year old female, recent inversion injury, x-rays did show an avulsion from the medial malleolus, she is tender in this location. We discussed boot immobilization for 4 to 6 weeks followed by ASO. Minimize weightbearing for now, she can use ibuprofen and then hydrocodone prescribed in the urgent care for breakthrough pain, calcium and vitamin D supplementation given. Return to see me in 4 weeks.

## 2022-02-26 ENCOUNTER — Other Ambulatory Visit (HOSPITAL_COMMUNITY): Payer: Self-pay

## 2022-03-03 ENCOUNTER — Other Ambulatory Visit (HOSPITAL_COMMUNITY): Payer: Self-pay

## 2022-03-03 ENCOUNTER — Telehealth: Payer: 59 | Admitting: Nurse Practitioner

## 2022-03-03 DIAGNOSIS — L719 Rosacea, unspecified: Secondary | ICD-10-CM | POA: Diagnosis not present

## 2022-03-03 MED ORDER — DOXYCYCLINE HYCLATE 100 MG PO TABS
100.0000 mg | ORAL_TABLET | Freq: Every day | ORAL | 0 refills | Status: AC
  Filled 2022-03-03: qty 45, 45d supply, fill #0

## 2022-03-03 NOTE — Progress Notes (Signed)
E visit for Rosacea We are sorry that you are not feeling well. Here is how we plan to help! Based on what you shared with me it looks like you have Rosacea.  Rosacea is a common chronic skin condition that usually only affects the face and eyes.  Occasionally, the neck, chest, or other areas may be involved.  Characterized by redness, pimples, and broken blood vessels, rosacea tends to begin after middle age (between the ages of 80 and 45).  It is more common in fair-skinned people and women in menopause. It may appear differently in dark skinned people but rosacea effects all ethnic groups.  The cause of rosacea is not fully understood. We do know that rosacea is worsened by various trigger factors including, spicy or hot foods, hot beverages such as coffee or tea, alcohol, and sun exposure just to name a few.  Signs of rosacea may vary greatly from person to person. In some individuals it may only flare up from time to time.   I have prescribed: An oral antibiotic that may lessen the redness of your skin called doxycycline '100mg'$  daily.  Overuse of topical antibiotics can be irritating, this time we recommend an oral antibiotic, you should take with food.   HOME CARE: Keep a record of triggers, such as stress, weather, or certain foods or drinks. Consider limiting hot or spicy foods and alcohol. Always use sunscreen that protects against UVA and UVB rays and has a sun-protecting factor (SPF) of 15 or higher. Avoid putting steroids on the skin sores. Steroids may make rosacea worse.  You may use small amounts of water based cosmetic while using this medication.  Apply cosmetics after cream has dried. If you shave your face, use an electric razor Don't scrub your skin or use sponges, brushes, or other abrasive tools. Doing so can irritate your skin. GET HELP RIGHT AWAY IF: If your rosacea gets worse or is not better within 4 weeks. If a new skin condition or rash develops. Loss of feeling or  tingling of treated area Nausea  MAKE SURE YOU   Understand these instructions. Will watch your condition. Will get help right away if you are not doing well or get worse.   Thank you for choosing an e-visit.  Your e-visit answers were reviewed by a board certified advanced clinical practitioner to complete your personal care plan. Depending upon the condition, your plan could have included both over the counter or prescription medications.  Please review your pharmacy choice. Make sure the pharmacy is open so you can pick up prescription now. If there is a problem, you may contact your provider through [employer] and have the prescription routed to another pharmacy.  Your safety is important to [place]. If you have drug allergies check your prescription carefully.   For the next 24 hours you can use MyChart to ask questions about today's visit, request a non-urgent call back, or ask for a work or school excuse. You will get an email in the next two days asking about your experience. I hope that your e-visit has been valuable and will speed your recovery.   I spent approximately 5 minutes reviewing the patient's history, current symptoms and coordinating their plan of care today.    Meds ordered this encounter  Medications   doxycycline (VIBRA-TABS) 100 MG tablet    Sig: Take 1 tablet (100 mg total) by mouth daily.    Dispense:  45 tablet    Refill:  0

## 2022-03-09 ENCOUNTER — Other Ambulatory Visit (HOSPITAL_COMMUNITY): Payer: Self-pay

## 2022-03-09 ENCOUNTER — Ambulatory Visit: Payer: 59 | Admitting: Family Medicine

## 2022-03-09 ENCOUNTER — Encounter: Payer: Self-pay | Admitting: Family Medicine

## 2022-03-09 VITALS — BP 104/68 | HR 93 | Ht 64.0 in | Wt 211.0 lb

## 2022-03-09 DIAGNOSIS — E782 Mixed hyperlipidemia: Secondary | ICD-10-CM

## 2022-03-09 DIAGNOSIS — Z23 Encounter for immunization: Secondary | ICD-10-CM | POA: Diagnosis not present

## 2022-03-09 DIAGNOSIS — E1169 Type 2 diabetes mellitus with other specified complication: Secondary | ICD-10-CM

## 2022-03-09 DIAGNOSIS — I10 Essential (primary) hypertension: Secondary | ICD-10-CM

## 2022-03-09 DIAGNOSIS — E669 Obesity, unspecified: Secondary | ICD-10-CM

## 2022-03-09 LAB — POCT GLYCOSYLATED HEMOGLOBIN (HGB A1C): HbA1c, POC (controlled diabetic range): 10.5 % — AB (ref 0.0–7.0)

## 2022-03-09 LAB — POCT UA - MICROALBUMIN
Creatinine, POC: 300 mg/dL
Microalbumin Ur, POC: 80 mg/L

## 2022-03-09 MED ORDER — METRONIDAZOLE 1 % EX GEL
Freq: Every day | CUTANEOUS | 0 refills | Status: DC
  Filled 2022-03-09: qty 60, 30d supply, fill #0

## 2022-03-09 MED ORDER — SYNJARDY XR 25-1000 MG PO TB24
1.0000 | ORAL_TABLET | Freq: Every day | ORAL | 1 refills | Status: DC
  Filled 2022-03-09: qty 90, 90d supply, fill #0

## 2022-03-09 NOTE — Assessment & Plan Note (Addendum)
A1c is up some.  She has not been as active due to her ankle and I would expect this improve once she is able to incorporate more activity.  I also encouraged her to work on her diet.  Will increase Rybelsus.  She will let me know if she is still taking the '3mg'$  or '7mg'$  at this time.  Making jardiance and metformin combo as Synjardy XR

## 2022-03-09 NOTE — Patient Instructions (Signed)
Let's try change to Synjardy (stop the metformin and jardiance and start this combination)  Let me know your current strength of Rybelsus.   I know you are anxious to get back to activity-Hang in there. Let me know if I can help!

## 2022-03-09 NOTE — Assessment & Plan Note (Signed)
Tolerating simvastatin well.  Continue at current strength.  

## 2022-03-09 NOTE — Assessment & Plan Note (Signed)
BP remains well controlled.  Continue current medications.  

## 2022-03-09 NOTE — Progress Notes (Signed)
Angela Casey - 61 y.o. female MRN [id number]  Date of birth: [birth year]  Subjective Chief Complaint  Patient presents with   Diabetes   Depression    HPI Angela Casey is a 61 y.o. female here today for follow up.  Since last visit she had a fall down stairs.  Has avulsion of medial malleolus.  Seen by Dr. Dianah Field.  Currently using walking boot.   Diabetes is currently managed with Rybelsus, glipizide, metformin and jardiance.  Activity has been decreased due to ankle injury.  Weight is stable from last visit.   BP is stable with losartan.  Denies side effects at current strength.    Tolerating simvastatin well at current strength.   ROS:  A comprehensive ROS was completed and negative except as noted per HPI  Allergies  Allergen Reactions   Livalo [Pitavastatin] Itching, Rash and Hives   Exenatide Nausea And Vomiting    diarrhea   Liraglutide     Victoza, Saxenda   Pollen Extract     Other reaction(s): sneezing   Tirzepatide Diarrhea    Mounjaro   Januvia [Sitagliptin] Diarrhea    Past Medical History:  Diagnosis Date   Allergy    Arthritis    knees   Asthma    asthmatic bronchitis   Diabetes mellitus without complication (HCC)    GERD (gastroesophageal reflux disease)    Hypercholesteremia     Past Surgical History:  Procedure Laterality Date   ANTERIOR CRUCIATE LIGAMENT REPAIR Right    BACK SURGERY     L5-S1   CARPAL TUNNEL RELEASE     DILATION AND CURETTAGE, DIAGNOSTIC / THERAPEUTIC     KNEE ARTHROSCOPY Left 06/28/2015   Procedure: Left Knee Arthroscopy and Debridement;  Surgeon: Meredith Pel, MD;  Location: [place];  Service: Orthopedics;  Laterality: Left;   KNEE ARTHROSCOPY WITH MEDIAL MENISECTOMY Left 06/28/2015   Procedure: KNEE ARTHROSCOPY WITH MEDIAL MENISECTOMY;  Surgeon: Meredith Pel, MD;  Location: [place];  Service: Orthopedics;  Laterality: Left;   KNEE SURGERY      Social History    Socioeconomic History   Marital status: Divorced    Spouse name: Not on file   Number of children: 1   Years of education: Not on file   Highest education level: Not on file  Occupational History   Not on file  Tobacco Use   Smoking status: Never   Smokeless tobacco: Never  Vaping Use   Vaping Use: Never used  Substance and Sexual Activity   Alcohol use: Yes    Alcohol/week: 0.0 - 1.0 standard drinks of alcohol    Comment: social   Drug use: No   Sexual activity: Not Currently    Partners: Male    Birth control/protection: Condom  Other Topics Concern   Not on file  Social History Narrative   Not on file   Social Determinants of Health   Financial Resource Strain: Not on file  Food Insecurity: Not on file  Transportation Needs: Not on file  Physical Activity: Not on file  Stress: Not on file  Social Connections: Not on file    Family History  Problem Relation Age of Onset   Cancer Mother        uterine   Diabetes Father    COPD Father    Asthma Father    Diabetes Paternal Grandfather    Breast cancer Paternal Grandmother        in  70's   Heart attack Paternal Grandmother    Diabetes Paternal Grandmother    Hypertension Maternal Grandmother    Heart attack Maternal Grandmother    Allergic rhinitis Neg Hx    Angioedema Neg Hx    Atopy Neg Hx    Eczema Neg Hx    Immunodeficiency Neg Hx    Urticaria Neg Hx     Health Maintenance  Topic Date Due   Diabetic kidney evaluation - GFR measurement  09/24/2021   Zoster Vaccines- Shingrix (1 of 2) 06/09/2022 (Originally 04/17/2012)   MAMMOGRAM  10/09/2022 (Originally 05/29/2022)   Hepatitis C Screening  10/09/2022 (Originally 04/17/1980)   HIV Screening  10/09/2022 (Originally 04/17/1977)   OPHTHALMOLOGY EXAM  04/21/2022   HEMOGLOBIN A1C  09/08/2022   Diabetic kidney evaluation - Urine ACR  03/10/2023   FOOT EXAM  03/10/2023   PAP SMEAR-Modifier  05/22/2024   COLONOSCOPY (Pts 45-22yr Insurance coverage  will need to be confirmed)  11/25/2024   TETANUS/TDAP  10/09/2031   INFLUENZA VACCINE  Completed   COVID-19 Vaccine  Completed   HPV VACCINES  Aged Out     ----------------------------------------------------------------------------------------------------------------------------------------------------------------------------------------------------------------- Physical Exam BP 104/68 (BP Location: Left Arm, Patient Position: Sitting, Cuff Size: Large)   Pulse 93   Ht '5\' 4"'$  (1.626 m)   Wt 211 lb (95.7 kg)   LMP 06/08/2010 (Within Months)   SpO2 97%   BMI 36.22 kg/m   Physical Exam Constitutional:      Appearance: Normal appearance.  Eyes:     General: No scleral icterus. Cardiovascular:     Rate and Rhythm: Normal rate and regular rhythm.  Pulmonary:     Effort: Pulmonary effort is normal.     Breath sounds: Normal breath sounds.  Musculoskeletal:     Cervical back: Neck supple.  Neurological:     Mental Status: She is alert.  Psychiatric:        Mood and Affect: Mood normal.        Behavior: Behavior normal.     ------------------------------------------------------------------------------------------------------------------------------------------------------------------------------------------------------------------- Assessment and Plan  Type 2 diabetes mellitus with obesity (HCC) A1c is up some.  She has not been as active due to her ankle and I would expect this improve once she is able to incorporate more activity.  I also encouraged her to work on her diet.  Will increase Rybelsus.  She will let me know if she is still taking the '3mg'$  or '7mg'$  at this time.  Making jardiance and metformin combo as Synjardy XR  Hyperlipidemia Tolerating simvastatin well.  Continue at current strength.    Essential hypertension BP remains well controlled.  Continue current medications.    Meds ordered this encounter  Medications   Empagliflozin-metFORMIN HCl ER (SYNJARDY XR)  25-1000 MG TB24    Sig: Take 1 tablet by mouth daily.    Dispense:  90 tablet    Refill:  1   metroNIDAZOLE (METROGEL) 1 % gel    Sig: Apply to affected area daily.    Dispense:  60 g    Refill:  0    Return in about 4 months (around 07/10/2022) for T2DM.    This visit occurred during the SARS-CoV-2 public health emergency.  Safety protocols were in place, including screening questions prior to the visit, additional usage of staff PPE, and extensive cleaning of exam room while observing appropriate contact time as indicated for disinfecting solutions.

## 2022-03-10 LAB — CBC WITH DIFFERENTIAL/PLATELET
Absolute Monocytes: 518 cells/uL (ref 200–950)
Basophils Absolute: 21 cells/uL (ref 0–200)
Basophils Relative: 0.3 %
Eosinophils Absolute: 186 cells/uL (ref 15–500)
Eosinophils Relative: 2.7 %
HCT: 40.2 % (ref 35.0–45.0)
Hemoglobin: 13.2 g/dL (ref 11.7–15.5)
Lymphs Abs: 2084 cells/uL (ref 850–3900)
MCH: 29.8 pg (ref 27.0–33.0)
MCHC: 32.8 g/dL (ref 32.0–36.0)
MCV: 90.7 fL (ref 80.0–100.0)
MPV: 10.9 fL (ref 7.5–12.5)
Monocytes Relative: 7.5 %
Neutro Abs: 4092 cells/uL (ref 1500–7800)
Neutrophils Relative %: 59.3 %
Platelets: 270 10*3/uL (ref 140–400)
RBC: 4.43 10*6/uL (ref 3.80–5.10)
RDW: 12.2 % (ref 11.0–15.0)
Total Lymphocyte: 30.2 %
WBC: 6.9 10*3/uL (ref 3.8–10.8)

## 2022-03-10 LAB — COMPLETE METABOLIC PANEL WITH GFR
AG Ratio: 1.7 (calc) (ref 1.0–2.5)
ALT: 19 U/L (ref 6–29)
AST: 13 U/L (ref 10–35)
Albumin: 4.1 g/dL (ref 3.6–5.1)
Alkaline phosphatase (APISO): 71 U/L (ref 37–153)
BUN: 15 mg/dL (ref 7–25)
CO2: 27 mmol/L (ref 20–32)
Calcium: 9.3 mg/dL (ref 8.6–10.4)
Chloride: 103 mmol/L (ref 98–110)
Creat: 0.69 mg/dL (ref 0.50–1.03)
Globulin: 2.4 g/dL (calc) (ref 1.9–3.7)
Glucose, Bld: 291 mg/dL — ABNORMAL HIGH (ref 65–99)
Potassium: 4.5 mmol/L (ref 3.5–5.3)
Sodium: 138 mmol/L (ref 135–146)
Total Bilirubin: 0.4 mg/dL (ref 0.2–1.2)
Total Protein: 6.5 g/dL (ref 6.1–8.1)
eGFR: 100 mL/min/{1.73_m2} (ref >=60)

## 2022-03-10 LAB — LIPID PANEL W/REFLEX DIRECT LDL
Cholesterol: 102 mg/dL (ref <200)
HDL: 40 mg/dL — ABNORMAL LOW (ref >=50)
LDL Cholesterol (Calc): 43 mg/dL (calc)
Non-HDL Cholesterol (Calc): 62 mg/dL (calc) (ref <130)
Total CHOL/HDL Ratio: 2.6 (calc) (ref <5.0)
Triglycerides: 105 mg/dL (ref <150)

## 2022-03-10 LAB — TSH: TSH: 3.5 mIU/L (ref 0.40–4.50)

## 2022-03-12 ENCOUNTER — Encounter: Payer: Self-pay | Admitting: Family Medicine

## 2022-03-12 ENCOUNTER — Other Ambulatory Visit (HOSPITAL_COMMUNITY): Payer: Self-pay

## 2022-03-12 MED ORDER — RYBELSUS 7 MG PO TABS
7.0000 mg | ORAL_TABLET | Freq: Every day | ORAL | 3 refills | Status: DC
  Filled 2022-03-12: qty 30, 30d supply, fill #0
  Filled 2022-05-06: qty 30, 30d supply, fill #1
  Filled 2022-08-05: qty 30, 30d supply, fill #2

## 2022-03-25 ENCOUNTER — Ambulatory Visit: Payer: 59 | Admitting: Sports Medicine

## 2022-03-25 ENCOUNTER — Other Ambulatory Visit (HOSPITAL_COMMUNITY): Payer: Self-pay

## 2022-03-25 ENCOUNTER — Encounter: Payer: Self-pay | Admitting: Sports Medicine

## 2022-03-25 DIAGNOSIS — L989 Disorder of the skin and subcutaneous tissue, unspecified: Secondary | ICD-10-CM | POA: Insufficient documentation

## 2022-03-25 DIAGNOSIS — S8251XA Displaced fracture of medial malleolus of right tibia, initial encounter for closed fracture: Secondary | ICD-10-CM | POA: Diagnosis not present

## 2022-03-25 MED ORDER — CLOTRIMAZOLE-BETAMETHASONE 1-0.05 % EX CREA
1.0000 | TOPICAL_CREAM | Freq: Two times a day (BID) | CUTANEOUS | 0 refills | Status: DC
  Filled 2022-03-25: qty 45, 30d supply, fill #0

## 2022-03-25 NOTE — Progress Notes (Signed)
    Procedures performed today:    None.  Independent interpretation of notes and tests performed by another provider:   None.  Brief History, Exam, Impression, and Recommendations:    Avulsion fracture of medial malleolus, right, closed, initial encounter This is a pleasant 61 year old female, she had a recent inversion injury, 6 weeks ago, we saw her at the last visit, x-rays did show a small medial malleoli are avulsion, she also had some arthritis in the ankle. She has been in the boot now for about 4 weeks, doing a lot better, only minimally tender at the tip of the medial malleolus. She is good motion, good strength. We are going to transition her into a lace up brace to be worn every day when up and about, I would also like her to go ahead and start some home conditioning, return to see me in about 4 to 6 weeks to reevaluate.  Skin lesion right anterior shoulder There is a somewhat scaly slightly erythematous and excoriated nonhealing skin lesion right upper shoulder that is been present for about 2 weeks. We will start with Lotrisone and if insufficient improvement at the 4-week point we will do a shave biopsy.    ____________________________________________ Gwen Her. Dianah Field, M.D., ABFM., CAQSM., AME. Primary Care and Sports Medicine  [facility]  Adjunct Professor of [place] [facility] [employer]  Risk manager

## 2022-03-25 NOTE — Assessment & Plan Note (Signed)
There is a somewhat scaly slightly erythematous and excoriated nonhealing skin lesion right upper shoulder that is been present for about 2 weeks. We will start with Lotrisone and if insufficient improvement at the 4-week point we will do a shave biopsy.

## 2022-03-25 NOTE — Assessment & Plan Note (Signed)
This is a pleasant 61 year old female, she had a recent inversion injury, 6 weeks ago, we saw her at the last visit, x-rays did show a small medial malleoli are avulsion, she also had some arthritis in the ankle. She has been in the boot now for about 4 weeks, doing a lot better, only minimally tender at the tip of the medial malleolus. She is good motion, good strength. We are going to transition her into a lace up brace to be worn every day when up and about, I would also like her to go ahead and start some home conditioning, return to see me in about 4 to 6 weeks to reevaluate.

## 2022-04-22 ENCOUNTER — Ambulatory Visit: Payer: 59 | Admitting: Sports Medicine

## 2022-04-22 DIAGNOSIS — E119 Type 2 diabetes mellitus without complications: Secondary | ICD-10-CM | POA: Diagnosis not present

## 2022-04-22 DIAGNOSIS — H2513 Age-related nuclear cataract, bilateral: Secondary | ICD-10-CM | POA: Diagnosis not present

## 2022-04-22 DIAGNOSIS — H04123 Dry eye syndrome of bilateral lacrimal glands: Secondary | ICD-10-CM | POA: Diagnosis not present

## 2022-04-22 DIAGNOSIS — H40013 Open angle with borderline findings, low risk, bilateral: Secondary | ICD-10-CM | POA: Diagnosis not present

## 2022-04-22 DIAGNOSIS — H52213 Irregular astigmatism, bilateral: Secondary | ICD-10-CM | POA: Diagnosis not present

## 2022-04-22 LAB — HM DIABETES EYE EXAM

## 2022-04-27 ENCOUNTER — Encounter: Payer: Self-pay | Admitting: Sports Medicine

## 2022-04-27 ENCOUNTER — Ambulatory Visit: Payer: 59 | Admitting: Sports Medicine

## 2022-04-27 DIAGNOSIS — S32010S Wedge compression fracture of first lumbar vertebra, sequela: Secondary | ICD-10-CM | POA: Diagnosis not present

## 2022-04-27 DIAGNOSIS — S8251XA Displaced fracture of medial malleolus of right tibia, initial encounter for closed fracture: Secondary | ICD-10-CM

## 2022-04-27 DIAGNOSIS — L989 Disorder of the skin and subcutaneous tissue, unspecified: Secondary | ICD-10-CM

## 2022-04-27 NOTE — Assessment & Plan Note (Signed)
Resolved with Lotrisone

## 2022-04-27 NOTE — Progress Notes (Signed)
    Procedures performed today:    None.  Independent interpretation of notes and tests performed by another provider:   None.  Brief History, Exam, Impression, and Recommendations:    Skin lesion right anterior shoulder Resolved with Lotrisone  Avulsion fracture of medial malleolus, right, closed, initial encounter 10 to 11 weeks post medial malleolar tiny avulsion fracture, doing much better, continue home conditioning, return to see me as needed. Of note she did have some arthritis in the ankle as well.    ____________________________________________ Gwen Her. Dianah Field, M.D., ABFM., CAQSM., AME. Primary Care and Sports Medicine [place] [facility]  Adjunct Professor of [employer] [facility] [employer]  Risk manager

## 2022-04-27 NOTE — Assessment & Plan Note (Signed)
10 to 11 weeks post medial malleolar tiny avulsion fracture, doing much better, continue home conditioning, return to see me as needed. Of note she did have some arthritis in the ankle as well.

## 2022-05-06 ENCOUNTER — Other Ambulatory Visit (HOSPITAL_COMMUNITY): Payer: Self-pay

## 2022-05-06 ENCOUNTER — Other Ambulatory Visit: Payer: Self-pay | Admitting: Family Medicine

## 2022-05-06 DIAGNOSIS — E782 Mixed hyperlipidemia: Secondary | ICD-10-CM

## 2022-05-06 DIAGNOSIS — E1165 Type 2 diabetes mellitus with hyperglycemia: Secondary | ICD-10-CM

## 2022-05-06 MED ORDER — GLIPIZIDE 5 MG PO TABS
5.0000 mg | ORAL_TABLET | Freq: Every evening | ORAL | 3 refills | Status: DC
  Filled 2022-05-06: qty 90, 90d supply, fill #0
  Filled 2022-08-05: qty 90, 90d supply, fill #1
  Filled 2022-10-27: qty 90, 90d supply, fill #2
  Filled 2023-03-21: qty 90, 90d supply, fill #3

## 2022-05-06 MED ORDER — LOSARTAN POTASSIUM 50 MG PO TABS
50.0000 mg | ORAL_TABLET | Freq: Every day | ORAL | 1 refills | Status: DC
  Filled 2022-05-06: qty 90, 90d supply, fill #0
  Filled 2022-08-05: qty 90, 90d supply, fill #1

## 2022-05-08 ENCOUNTER — Other Ambulatory Visit (HOSPITAL_COMMUNITY): Payer: Self-pay

## 2022-05-08 ENCOUNTER — Telehealth: Payer: 59 | Admitting: Family Medicine

## 2022-05-08 DIAGNOSIS — B9689 Other specified bacterial agents as the cause of diseases classified elsewhere: Secondary | ICD-10-CM

## 2022-05-08 DIAGNOSIS — J019 Acute sinusitis, unspecified: Secondary | ICD-10-CM

## 2022-05-08 MED ORDER — ALBUTEROL SULFATE HFA 108 (90 BASE) MCG/ACT IN AERS
2.0000 | INHALATION_SPRAY | Freq: Four times a day (QID) | RESPIRATORY_TRACT | 0 refills | Status: DC | PRN
  Filled 2022-05-08: qty 6.7, 25d supply, fill #0

## 2022-05-08 MED ORDER — BENZONATATE 100 MG PO CAPS
200.0000 mg | ORAL_CAPSULE | Freq: Two times a day (BID) | ORAL | 0 refills | Status: DC | PRN
  Filled 2022-05-08: qty 20, 5d supply, fill #0

## 2022-05-08 MED ORDER — AMOXICILLIN-POT CLAVULANATE 875-125 MG PO TABS
1.0000 | ORAL_TABLET | Freq: Two times a day (BID) | ORAL | 0 refills | Status: AC
  Filled 2022-05-08: qty 14, 7d supply, fill #0

## 2022-05-08 NOTE — Progress Notes (Signed)
E-Visit for Sinus Problems  We are sorry that you are not feeling well.  Here is how we plan to help!  Based on what you have shared with me it looks like you have sinusitis.  Sinusitis is inflammation and infection in the sinus cavities of the head.  Based on your presentation I believe you most likely have Acute Bacterial Sinusitis.  This is an infection caused by bacteria and is treated with antibiotics. I have prescribed Augmentin '875mg'$ /'125mg'$  one tablet twice daily with food, for 7 days. You may use an oral decongestant such as Mucinex D or if you have glaucoma or high blood pressure use plain Mucinex. Saline nasal spray help and can safely be used as often as needed for congestion.  If you develop worsening sinus pain, fever or notice severe headache and vision changes, or if symptoms are not better after completion of antibiotic, please schedule an appointment with a health care provider.    I will order you tessalon perles as well as an inhaler. Start using Flonase daily- as this might have been triggered by allergies like you reported.    Sinus infections are not as easily transmitted as other respiratory infection, however we still recommend that you avoid close contact with loved ones, especially the very young and elderly.  Remember to wash your hands thoroughly throughout the day as this is the number one way to prevent the spread of infection!  Home Care: Only take medications as instructed by your medical team. Complete the entire course of an antibiotic. Do not take these medications with alcohol. A steam or ultrasonic humidifier can help congestion.  You can place a towel over your head and breathe in the steam from hot water coming from a faucet. Avoid close contacts especially the very young and the elderly. Cover your mouth when you cough or sneeze. Always remember to wash your hands.  Get Help Right Away If: You develop worsening fever or sinus pain. You develop a severe  head ache or visual changes. Your symptoms persist after you have completed your treatment plan.  Make sure you Understand these instructions. Will watch your condition. Will get help right away if you are not doing well or get worse.  Thank you for choosing an e-visit.  Your e-visit answers were reviewed by a board certified advanced clinical practitioner to complete your personal care plan. Depending upon the condition, your plan could have included both over the counter or prescription medications.  Please review your pharmacy choice. Make sure the pharmacy is open so you can pick up prescription now. If there is a problem, you may contact your provider through [employer] and have the prescription routed to another pharmacy.  Your safety is important to [place]. If you have drug allergies check your prescription carefully.   For the next 24 hours you can use MyChart to ask questions about today's visit, request a non-urgent call back, or ask for a work or school excuse. You will get an email in the next two days asking about your experience. I hope that your e-visit has been valuable and will speed your recovery.  I provided 5 minutes of non face-to-face time during this encounter for chart review, medication and order placement, as well as and documentation.

## 2022-06-04 DIAGNOSIS — Z124 Encounter for screening for malignant neoplasm of cervix: Secondary | ICD-10-CM | POA: Diagnosis not present

## 2022-06-04 DIAGNOSIS — Z01419 Encounter for gynecological examination (general) (routine) without abnormal findings: Secondary | ICD-10-CM | POA: Diagnosis not present

## 2022-06-24 ENCOUNTER — Other Ambulatory Visit (HOSPITAL_COMMUNITY): Payer: Self-pay

## 2022-06-24 ENCOUNTER — Other Ambulatory Visit: Payer: Self-pay

## 2022-08-03 ENCOUNTER — Ambulatory Visit: Payer: 59 | Admitting: Family Medicine

## 2022-08-05 ENCOUNTER — Other Ambulatory Visit: Payer: Self-pay

## 2022-08-05 ENCOUNTER — Other Ambulatory Visit: Payer: Self-pay | Admitting: Family Medicine

## 2022-08-05 ENCOUNTER — Other Ambulatory Visit (HOSPITAL_COMMUNITY): Payer: Self-pay

## 2022-08-05 MED ORDER — METFORMIN HCL ER 500 MG PO TB24
2000.0000 mg | ORAL_TABLET | Freq: Every day | ORAL | 1 refills | Status: DC
  Filled 2022-08-05 – 2022-09-28 (×2): qty 360, 90d supply, fill #0
  Filled 2022-12-23: qty 360, 90d supply, fill #1

## 2022-08-05 MED ORDER — SIMVASTATIN 20 MG PO TABS
20.0000 mg | ORAL_TABLET | Freq: Every day | ORAL | 3 refills | Status: DC
  Filled 2022-08-05: qty 90, 90d supply, fill #0
  Filled 2022-10-27: qty 90, 90d supply, fill #1
  Filled 2023-03-21: qty 90, 90d supply, fill #2
  Filled 2023-06-17: qty 90, 90d supply, fill #3

## 2022-08-06 ENCOUNTER — Other Ambulatory Visit: Payer: Self-pay

## 2022-08-06 ENCOUNTER — Other Ambulatory Visit (HOSPITAL_COMMUNITY): Payer: Self-pay

## 2022-08-21 ENCOUNTER — Ambulatory Visit: Payer: Commercial Managed Care - PPO | Admitting: Family Medicine

## 2022-08-21 ENCOUNTER — Encounter: Payer: Self-pay | Admitting: Family Medicine

## 2022-08-21 ENCOUNTER — Other Ambulatory Visit (HOSPITAL_COMMUNITY): Payer: Self-pay

## 2022-08-21 ENCOUNTER — Ambulatory Visit: Payer: Commercial Managed Care - PPO | Admitting: Podiatry

## 2022-08-21 ENCOUNTER — Other Ambulatory Visit: Payer: Self-pay

## 2022-08-21 VITALS — BP 114/73 | HR 86 | Ht 64.0 in | Wt 212.0 lb

## 2022-08-21 DIAGNOSIS — E1169 Type 2 diabetes mellitus with other specified complication: Secondary | ICD-10-CM

## 2022-08-21 DIAGNOSIS — I10 Essential (primary) hypertension: Secondary | ICD-10-CM

## 2022-08-21 DIAGNOSIS — E782 Mixed hyperlipidemia: Secondary | ICD-10-CM

## 2022-08-21 DIAGNOSIS — Z23 Encounter for immunization: Secondary | ICD-10-CM

## 2022-08-21 DIAGNOSIS — G5761 Lesion of plantar nerve, right lower limb: Secondary | ICD-10-CM

## 2022-08-21 DIAGNOSIS — E669 Obesity, unspecified: Secondary | ICD-10-CM

## 2022-08-21 LAB — POCT GLYCOSYLATED HEMOGLOBIN (HGB A1C): HbA1c, POC (controlled diabetic range): 10.7 % — AB (ref 0.0–7.0)

## 2022-08-21 MED ORDER — BETAMETHASONE SOD PHOS & ACET 6 (3-3) MG/ML IJ SUSP
3.0000 mg | Freq: Once | INTRAMUSCULAR | Status: AC
  Administered 2022-08-21: 3 mg via INTRA_ARTICULAR

## 2022-08-21 MED ORDER — METHYLPREDNISOLONE 4 MG PO TBPK
ORAL_TABLET | ORAL | 0 refills | Status: DC
  Filled 2022-08-21 – 2022-09-07 (×2): qty 21, 6d supply, fill #0

## 2022-08-21 MED ORDER — RYBELSUS 14 MG PO TABS
14.0000 mg | ORAL_TABLET | Freq: Every day | ORAL | 1 refills | Status: DC
  Filled 2022-08-21: qty 90, 90d supply, fill #0
  Filled 2022-11-27: qty 30, 30d supply, fill #0
  Filled 2023-01-26: qty 30, 30d supply, fill #1

## 2022-08-21 MED ORDER — MELOXICAM 15 MG PO TABS
15.0000 mg | ORAL_TABLET | Freq: Every day | ORAL | 1 refills | Status: DC
  Filled 2022-08-21 – 2022-09-07 (×2): qty 30, 30d supply, fill #0
  Filled 2023-03-21: qty 30, 30d supply, fill #1

## 2022-08-21 NOTE — Progress Notes (Signed)
Chief Complaint  Patient presents with   Neuroma    Nerve Pain in right foot. Can't play golf or walk too far or long without a lot of pain. Request another injection. Rate of pain 20 out of 10, left 3 rd toe stays red all the time,     HPI: 62 y.o. female presenting today for recurrence of right forefoot pain secondary to a Morton's neuroma.  Patient last seen in the office on 09/08/2021 approximately 1 year ago.  Patient states that she was doing very well until she reaggravated her foot.  It has been painful and tender over the past few months.  Past Medical History:  Diagnosis Date   Allergy    Arthritis    knees   Asthma    asthmatic bronchitis   Diabetes mellitus without complication (HCC)    GERD (gastroesophageal reflux disease)    Hypercholesteremia    Past Surgical History:  Procedure Laterality Date   ANTERIOR CRUCIATE LIGAMENT REPAIR Right    BACK SURGERY     L5-S1   CARPAL TUNNEL RELEASE     DILATION AND CURETTAGE, DIAGNOSTIC / THERAPEUTIC     KNEE ARTHROSCOPY Left 06/28/2015   Procedure: Left Knee Arthroscopy and Debridement;  Surgeon: Meredith Pel, MD;  Location: [place];  Service: Orthopedics;  Laterality: Left;   KNEE ARTHROSCOPY WITH MEDIAL MENISECTOMY Left 06/28/2015   Procedure: KNEE ARTHROSCOPY WITH MEDIAL MENISECTOMY;  Surgeon: Meredith Pel, MD;  Location: [place];  Service: Orthopedics;  Laterality: Left;   KNEE SURGERY     Allergies  Allergen Reactions   Livalo [Pitavastatin] Itching, Rash and Hives   Exenatide Nausea And Vomiting    diarrhea   Liraglutide     Victoza, Saxenda   Pollen Extract     Other reaction(s): sneezing   Tirzepatide Diarrhea    Mounjaro   Januvia [Sitagliptin] Diarrhea    Physical Exam: General: The patient is alert and oriented x3 in no acute distress.  Dermatology: Skin is warm, dry and supple bilateral lower extremities. Negative for open lesions or  macerations.  Vascular: Palpable pedal pulses bilaterally. No edema or erythema noted. Capillary refill within normal limits.  Neurological: Light touch and protective threshold grossly intact bilaterally.   Musculoskeletal Exam: Range of motion within normal limits to all pedal and ankle joints bilateral. Muscle strength 5/5 in all groups bilateral.  There is pain on palpation to the third interspace right foot with a positive Mulder sign.  Pain with lateral compression of the metatarsal heads also noted.  Assessment: 1.  Morton's neuroma right third interspace   Plan of Care:  1. Patient evaluated.  The patient had very good success that seem to last for several months with her treatment regimen from last year.  We will continue to do the same as long as she continues to have lasting alleviation of her symptoms with the conservative treatment 2.  Injection 0.5 cc Celestone Soluspan injected into the third interspace right foot 3.  Prescription for a Medrol Dosepak 4.  Prescription for meloxicam 15 mg daily after completion of the Dosepak 5.  Recommend wide fitting shoes that do not compress the forefoot or toebox area 6.  Return to clinic as needed      Edrick Kins, DPM [facility]  Dr. Edrick Kins, DPM    [address]  [place], [place] [address]                Office [phone]  Fax [phone]

## 2022-08-21 NOTE — Progress Notes (Unsigned)
Angela Casey - 62 y.o. female MRN [id number]  Date of birth: 28-Oct-1960  Subjective No chief complaint on file.   HPI  Allergies  Allergen Reactions   Livalo [Pitavastatin] Itching, Rash and Hives   Exenatide Nausea And Vomiting    diarrhea   Liraglutide     Victoza, Saxenda   Pollen Extract     Other reaction(s): sneezing   Tirzepatide Diarrhea    Mounjaro   Januvia [Sitagliptin] Diarrhea    Past Medical History:  Diagnosis Date   Allergy    Arthritis    knees   Asthma    asthmatic bronchitis   Diabetes mellitus without complication (HCC)    GERD (gastroesophageal reflux disease)    Hypercholesteremia     Past Surgical History:  Procedure Laterality Date   ANTERIOR CRUCIATE LIGAMENT REPAIR Right    BACK SURGERY     L5-S1   CARPAL TUNNEL RELEASE     DILATION AND CURETTAGE, DIAGNOSTIC / THERAPEUTIC     KNEE ARTHROSCOPY Left 06/28/2015   Procedure: Left Knee Arthroscopy and Debridement;  Surgeon: Meredith Pel, MD;  Location: [facility];  Service: Orthopedics;  Laterality: Left;   KNEE ARTHROSCOPY WITH MEDIAL MENISECTOMY Left 06/28/2015   Procedure: KNEE ARTHROSCOPY WITH MEDIAL MENISECTOMY;  Surgeon: Meredith Pel, MD;  Location: [facility];  Service: Orthopedics;  Laterality: Left;   KNEE SURGERY      Social History   Socioeconomic History   Marital status: Divorced    Spouse name: Not on file   Number of children: 1   Years of education: Not on file   Highest education level: Not on file  Occupational History   Not on file  Tobacco Use   Smoking status: Never   Smokeless tobacco: Never  Vaping Use   Vaping Use: Never used  Substance and Sexual Activity   Alcohol use: Yes    Alcohol/week: 0.0 - 1.0 standard drinks of alcohol    Comment: social   Drug use: No   Sexual activity: Not Currently    Partners: Male    Birth control/protection: Condom  Other Topics Concern   Not on file  Social History Narrative    Not on file   Social Determinants of Health   Financial Resource Strain: Not on file  Food Insecurity: Not on file  Transportation Needs: Not on file  Physical Activity: Not on file  Stress: Not on file  Social Connections: Not on file    Family History  Problem Relation Age of Onset   Cancer Mother        uterine   Diabetes Father    COPD Father    Asthma Father    Diabetes Paternal Grandfather    Breast cancer Paternal Grandmother        in 52's   Heart attack Paternal Grandmother    Diabetes Paternal Grandmother    Hypertension Maternal Grandmother    Heart attack Maternal Grandmother    Allergic rhinitis Neg Hx    Angioedema Neg Hx    Atopy Neg Hx    Eczema Neg Hx    Immunodeficiency Neg Hx    Urticaria Neg Hx     Health Maintenance  Topic Date Due   Zoster Vaccines- Shingrix (1 of 2) Never done   COVID-19 Vaccine (5 - 2023-24 season) 02/06/2022   OPHTHALMOLOGY EXAM  04/21/2022   MAMMOGRAM  10/09/2022 (Originally 05/29/2022)   Hepatitis C Screening  10/09/2022 (Originally 04/17/1980)  HIV Screening  10/09/2022 (Originally 04/17/1977)   HEMOGLOBIN A1C  09/08/2022   Diabetic kidney evaluation - eGFR measurement  03/10/2023   Diabetic kidney evaluation - Urine ACR  03/10/2023   FOOT EXAM  03/10/2023   PAP SMEAR-Modifier  05/22/2024   COLONOSCOPY (Pts 45-54yrs Insurance coverage will need to be confirmed)  11/25/2024   DTaP/Tdap/Td (2 - Td or Tdap) 10/09/2031   INFLUENZA VACCINE  Completed   HPV VACCINES  Aged Out     ----------------------------------------------------------------------------------------------------------------------------------------------------------------------------------------------------------------- Physical Exam LMP 06/08/2010 (Within Months)   Physical  Exam  ------------------------------------------------------------------------------------------------------------------------------------------------------------------------------------------------------------------- Assessment and Plan  No problem-specific Assessment & Plan notes found for this encounter.   No orders of the defined types were placed in this encounter.   No follow-ups on file.    This visit occurred during the SARS-CoV-2 public health emergency.  Safety protocols were in place, including screening questions prior to the visit, additional usage of staff PPE, and extensive cleaning of exam room while observing appropriate contact time as indicated for disinfecting solutions.

## 2022-08-23 ENCOUNTER — Encounter: Payer: Self-pay | Admitting: Family Medicine

## 2022-08-23 NOTE — Assessment & Plan Note (Signed)
Blood pressure is well-controlled with losartan at current strength.  Recommend continuation.

## 2022-08-23 NOTE — Assessment & Plan Note (Signed)
Diabetes remains uncontrolled.  Continue titration of Rybelsus to 14 mg daily.  Encouraged to work on dietary changes and incorporation of regular activity.

## 2022-08-23 NOTE — Assessment & Plan Note (Signed)
Tolerating simvastatin well at current strength.  Will plan to continue this at current strength.

## 2022-08-24 ENCOUNTER — Other Ambulatory Visit: Payer: Self-pay

## 2022-08-24 ENCOUNTER — Other Ambulatory Visit (HOSPITAL_COMMUNITY): Payer: Self-pay

## 2022-09-07 ENCOUNTER — Other Ambulatory Visit (HOSPITAL_COMMUNITY): Payer: Self-pay

## 2022-09-07 ENCOUNTER — Other Ambulatory Visit: Payer: Self-pay | Admitting: Family Medicine

## 2022-09-08 ENCOUNTER — Other Ambulatory Visit: Payer: Self-pay

## 2022-09-08 MED ORDER — FREESTYLE LITE TEST VI STRP
ORAL_STRIP | 12 refills | Status: DC
  Filled 2022-09-08: qty 300, 90d supply, fill #0
  Filled 2023-03-21: qty 300, 90d supply, fill #1
  Filled 2023-06-17: qty 300, 90d supply, fill #2

## 2022-09-11 ENCOUNTER — Ambulatory Visit (INDEPENDENT_AMBULATORY_CARE_PROVIDER_SITE_OTHER): Payer: Commercial Managed Care - PPO

## 2022-09-11 ENCOUNTER — Ambulatory Visit: Payer: Commercial Managed Care - PPO | Admitting: Sports Medicine

## 2022-09-11 DIAGNOSIS — M545 Low back pain, unspecified: Secondary | ICD-10-CM | POA: Diagnosis not present

## 2022-09-11 DIAGNOSIS — M5136 Other intervertebral disc degeneration, lumbar region: Secondary | ICD-10-CM | POA: Diagnosis not present

## 2022-09-11 DIAGNOSIS — M51369 Other intervertebral disc degeneration, lumbar region without mention of lumbar back pain or lower extremity pain: Secondary | ICD-10-CM | POA: Insufficient documentation

## 2022-09-11 MED ORDER — PREDNISONE 50 MG PO TABS: ORAL_TABLET | ORAL | 0 refills | Status: DC

## 2022-09-11 NOTE — Progress Notes (Signed)
    Procedures performed today:    None.  Independent interpretation of notes and tests performed by another provider:   None.  Brief History, Exam, Impression, and Recommendations:    Lumbar degenerative disc disease This is a very pleasant 62 year old female, she has a long history of back pain, known L5-S1 microdiscectomy in the distant past. More recently had increasing axial low back pain, worse with sitting, flexion, Valsalva consistent with a discogenic pain generator. No red flag symptoms. We discussed the anatomy, pathophysiology and evolutionary anthropology of lumbar disc disease to help her understand the treatment approach. She will do aggressive core conditioning, I will give her a prescription of prednisone to use as she grew up and should her pain return if she does have to drive 6 hours to [place] to play golf. I would like some baseline lumbar spine x-rays. Return to see me as needed.    ____________________________________________ Ihor Austin. Benjamin Stain, M.D., ABFM., CAQSM., AME. Primary Care and Sports Medicine [facility]  Adjunct Professor of Family Medicine  [employer] [employer]  Restaurant manager, fast food

## 2022-09-11 NOTE — Assessment & Plan Note (Signed)
This is a very pleasant 62 year old female, she has a long history of back pain, known L5-S1 microdiscectomy in the distant past. More recently had increasing axial low back pain, worse with sitting, flexion, Valsalva consistent with a discogenic pain generator. No red flag symptoms. We discussed the anatomy, pathophysiology and evolutionary anthropology of lumbar disc disease to help her understand the treatment approach. She will do aggressive core conditioning, I will give her a prescription of prednisone to use as she grew up and should her pain return if she does have to drive 6 hours to [place] to play golf. I would like some baseline lumbar spine x-rays. Return to see me as needed.

## 2022-09-28 ENCOUNTER — Other Ambulatory Visit: Payer: Self-pay

## 2022-10-27 ENCOUNTER — Other Ambulatory Visit (HOSPITAL_COMMUNITY): Payer: Self-pay

## 2022-10-27 ENCOUNTER — Other Ambulatory Visit: Payer: Self-pay | Admitting: Family Medicine

## 2022-10-27 ENCOUNTER — Other Ambulatory Visit: Payer: Self-pay

## 2022-10-27 DIAGNOSIS — E782 Mixed hyperlipidemia: Secondary | ICD-10-CM

## 2022-10-28 ENCOUNTER — Other Ambulatory Visit (HOSPITAL_COMMUNITY): Payer: Self-pay

## 2022-10-28 MED ORDER — LOSARTAN POTASSIUM 50 MG PO TABS
50.0000 mg | ORAL_TABLET | Freq: Every day | ORAL | 1 refills | Status: DC
  Filled 2022-10-28: qty 90, 90d supply, fill #0
  Filled 2022-11-27 – 2023-03-21 (×2): qty 90, 90d supply, fill #1

## 2022-11-27 ENCOUNTER — Other Ambulatory Visit (HOSPITAL_COMMUNITY): Payer: Self-pay

## 2022-11-27 ENCOUNTER — Other Ambulatory Visit: Payer: Self-pay | Admitting: Family Medicine

## 2022-11-27 DIAGNOSIS — E1165 Type 2 diabetes mellitus with hyperglycemia: Secondary | ICD-10-CM

## 2022-11-30 ENCOUNTER — Other Ambulatory Visit: Payer: Self-pay

## 2022-11-30 ENCOUNTER — Other Ambulatory Visit (HOSPITAL_COMMUNITY): Payer: Self-pay

## 2022-11-30 MED ORDER — EMPAGLIFLOZIN 25 MG PO TABS
25.0000 mg | ORAL_TABLET | Freq: Every day | ORAL | 0 refills | Status: DC
  Filled 2022-11-30: qty 90, 90d supply, fill #0

## 2022-12-22 ENCOUNTER — Ambulatory Visit: Payer: Commercial Managed Care - PPO | Admitting: Family Medicine

## 2022-12-23 ENCOUNTER — Other Ambulatory Visit (HOSPITAL_COMMUNITY): Payer: Self-pay

## 2023-01-04 ENCOUNTER — Other Ambulatory Visit (HOSPITAL_COMMUNITY): Payer: Self-pay

## 2023-01-04 DIAGNOSIS — R112 Nausea with vomiting, unspecified: Secondary | ICD-10-CM | POA: Diagnosis not present

## 2023-01-04 DIAGNOSIS — R1084 Generalized abdominal pain: Secondary | ICD-10-CM | POA: Diagnosis not present

## 2023-01-04 DIAGNOSIS — R197 Diarrhea, unspecified: Secondary | ICD-10-CM | POA: Diagnosis not present

## 2023-01-04 MED ORDER — DICYCLOMINE HCL 10 MG PO CAPS
10.0000 mg | ORAL_CAPSULE | Freq: Three times a day (TID) | ORAL | 11 refills | Status: DC | PRN
  Filled 2023-01-04: qty 90, 30d supply, fill #0

## 2023-01-20 ENCOUNTER — Ambulatory Visit: Payer: Commercial Managed Care - PPO | Admitting: Family Medicine

## 2023-01-26 ENCOUNTER — Other Ambulatory Visit: Payer: Self-pay

## 2023-01-28 ENCOUNTER — Other Ambulatory Visit (HOSPITAL_COMMUNITY): Payer: Self-pay

## 2023-01-28 ENCOUNTER — Ambulatory Visit: Payer: Commercial Managed Care - PPO | Admitting: Family Medicine

## 2023-01-28 ENCOUNTER — Encounter: Payer: Self-pay | Admitting: Family Medicine

## 2023-01-28 VITALS — BP 123/83 | HR 83 | Ht 64.0 in | Wt 209.0 lb

## 2023-01-28 DIAGNOSIS — E782 Mixed hyperlipidemia: Secondary | ICD-10-CM | POA: Diagnosis not present

## 2023-01-28 DIAGNOSIS — Z7984 Long term (current) use of oral hypoglycemic drugs: Secondary | ICD-10-CM

## 2023-01-28 DIAGNOSIS — E669 Obesity, unspecified: Secondary | ICD-10-CM | POA: Diagnosis not present

## 2023-01-28 DIAGNOSIS — E1169 Type 2 diabetes mellitus with other specified complication: Secondary | ICD-10-CM

## 2023-01-28 DIAGNOSIS — I10 Essential (primary) hypertension: Secondary | ICD-10-CM | POA: Diagnosis not present

## 2023-01-28 DIAGNOSIS — Z23 Encounter for immunization: Secondary | ICD-10-CM | POA: Diagnosis not present

## 2023-01-28 LAB — POCT GLYCOSYLATED HEMOGLOBIN (HGB A1C): Hemoglobin A1C: 10.2 % — AB (ref 4.0–5.6)

## 2023-01-28 MED ORDER — TIRZEPATIDE 5 MG/0.5ML ~~LOC~~ SOAJ
5.0000 mg | SUBCUTANEOUS | 0 refills | Status: DC
  Filled 2023-01-28: qty 6, 84d supply, fill #0
  Filled 2023-02-17 – 2023-03-21 (×2): qty 2, 28d supply, fill #0

## 2023-01-28 NOTE — Assessment & Plan Note (Signed)
Diabetes remains uncontrolled.  She is on maximum dose of rybelsus, jarsiance and metformin.  She is willing to and would like to try [facility] again.  May need endo referral or addition of insulin if not tolerating mounjaro. Encouraged to work on dietary changes and incorporation of regular activity.

## 2023-01-28 NOTE — Assessment & Plan Note (Signed)
Tolerating simvastatin well at current strength.  Will plan to continue this at current strength.

## 2023-01-28 NOTE — Assessment & Plan Note (Signed)
Blood pressure is well-controlled with losartan at current strength.  Recommend continuation.

## 2023-01-28 NOTE — Progress Notes (Signed)
Angela Casey - 62 y.o. female MRN [id number]  Date of birth: January 07, 1961  Subjective Chief Complaint  Patient presents with   Medical Management of Chronic Issues    HPI Angela Casey is a 62 y.o. female here today for follow up visit.   Her A1c remains elevated at 10.2%.  Remains on Jardiance, metformin and Rybelsus 14mg  daily.  She has not tolerated other GLP-1 medications due to nausea.  Weight is up some since last visit.   She has not really made any changes to her diet or activity level since last time.  She is interested in trying mounjaro again. She is tolerating simvastatin well for associated HLD  Remains on losartan for management of HTN.  BP remains well controlled.  Denies chest pain, shortness of breath, palpitations, headache or vision changes.   ROS:  A comprehensive ROS was completed and negative except as noted per HPI  Allergies  Allergen Reactions   Livalo [Pitavastatin] Itching, Rash and Hives   Exenatide Nausea And Vomiting    diarrhea   Liraglutide     Victoza, Saxenda   Pollen Extract     Other reaction(s): sneezing   Tirzepatide Diarrhea    Mounjaro   Januvia [Sitagliptin] Diarrhea    Past Medical History:  Diagnosis Date   Allergy    Arthritis    knees   Asthma    asthmatic bronchitis   Diabetes mellitus without complication (HCC)    GERD (gastroesophageal reflux disease)    Hypercholesteremia     Past Surgical History:  Procedure Laterality Date   ANTERIOR CRUCIATE LIGAMENT REPAIR Right    BACK SURGERY     L5-S1   CARPAL TUNNEL RELEASE     DILATION AND CURETTAGE, DIAGNOSTIC / THERAPEUTIC     KNEE ARTHROSCOPY Left 06/28/2015   Procedure: Left Knee Arthroscopy and Debridement;  Surgeon: Cammy Copa, MD;  Location: [facility];  Service: Orthopedics;  Laterality: Left;   KNEE ARTHROSCOPY WITH MEDIAL MENISECTOMY Left 06/28/2015   Procedure: KNEE ARTHROSCOPY WITH MEDIAL MENISECTOMY;  Surgeon: Cammy Copa, MD;   Location: [facility];  Service: Orthopedics;  Laterality: Left;   KNEE SURGERY      Social History   Socioeconomic History   Marital status: Divorced    Spouse name: Not on file   Number of children: 1   Years of education: Not on file   Highest education level: Master's degree (e.g., MA, MS, MEng, MEd, MSW, MBA)  Occupational History   Not on file  Tobacco Use   Smoking status: Never   Smokeless tobacco: Never  Vaping Use   Vaping status: Never Used  Substance and Sexual Activity   Alcohol use: Yes    Alcohol/week: 0.0 - 1.0 standard drinks of alcohol    Comment: social   Drug use: No   Sexual activity: Not Currently    Partners: Male    Birth control/protection: Condom  Other Topics Concern   Not on file  Social History Narrative   Not on file   Social Determinants of Health   Financial Resource Strain: Low Risk  (01/27/2023)   Overall Financial Resource Strain (CARDIA)    Difficulty of Paying Living Expenses: Not hard at all  Food Insecurity: No Food Insecurity (01/27/2023)   Hunger Vital Sign    Worried About Running Out of Food in the Last Year: Never true    Ran Out of Food in the Last Year: Never true  Transportation Needs: No Transportation Needs (01/27/2023)   PRAPARE - Administrator, Civil Service (Medical): No    Lack of Transportation (Non-Medical): No  Physical Activity: Insufficiently Active (01/27/2023)   Exercise Vital Sign    Days of Exercise per Week: 2 days    Minutes of Exercise per Session: 30 min  Stress: No Stress Concern Present (01/27/2023)   [employer] of Occupational Health - Occupational Stress Questionnaire    Feeling of Stress : Only a little  Social Connections: Moderately Integrated (01/27/2023)   Social Connection and Isolation Panel [NHANES]    Frequency of Communication with Friends and Family: Twice a week    Frequency of Social Gatherings with Friends and Family: Once a week    Attends Religious  Services: More than 4 times per year    Active Member of Clubs or Organizations: Yes    Attends Banker Meetings: Not on file    Marital Status: Divorced    Family History  Problem Relation Age of Onset   Cancer Mother        uterine   Diabetes Father    COPD Father    Asthma Father    Diabetes Paternal Grandfather    Breast cancer Paternal Grandmother        in 72's   Heart attack Paternal Grandmother    Diabetes Paternal Grandmother    Hypertension Maternal Grandmother    Heart attack Maternal Grandmother    Allergic rhinitis Neg Hx    Angioedema Neg Hx    Atopy Neg Hx    Eczema Neg Hx    Immunodeficiency Neg Hx    Urticaria Neg Hx     Health Maintenance  Topic Date Due   HIV Screening  Never done   Hepatitis C Screening  Never done   COVID-19 Vaccine (5 - 2023-24 season) 02/06/2022   OPHTHALMOLOGY EXAM  04/21/2022   MAMMOGRAM  05/29/2022   Diabetic kidney evaluation - eGFR measurement  03/10/2023   Diabetic kidney evaluation - Urine ACR  03/10/2023   FOOT EXAM  03/10/2023   HEMOGLOBIN A1C  07/31/2023   PAP SMEAR-Modifier  05/22/2024   Colonoscopy  11/25/2024   DTaP/Tdap/Td (2 - Td or Tdap) 10/09/2031   INFLUENZA VACCINE  Completed   Zoster Vaccines- Shingrix  Completed   HPV VACCINES  Aged Out     ----------------------------------------------------------------------------------------------------------------------------------------------------------------------------------------------------------------- Physical Exam BP 123/83   Pulse 83   Ht 5\' 4"  (1.626 m)   Wt 209 lb (94.8 kg)   LMP 06/08/2010 (Within Months)   SpO2 99%   BMI 35.87 kg/m   Physical Exam Constitutional:      Appearance: Normal appearance.  HENT:     Head: Normocephalic and atraumatic.  Eyes:     General: No scleral icterus. Cardiovascular:     Rate and Rhythm: Normal rate and regular rhythm.  Pulmonary:     Effort: Pulmonary effort is normal.     Breath sounds:  Normal breath sounds.  Musculoskeletal:     Cervical back: Neck supple.  Neurological:     Mental Status: She is alert.  Psychiatric:        Mood and Affect: Mood normal.        Behavior: Behavior normal.     ------------------------------------------------------------------------------------------------------------------------------------------------------------------------------------------------------------------- Assessment and Plan  Type 2 diabetes mellitus with obesity (HCC) Diabetes remains uncontrolled.  She is on maximum dose of rybelsus, jarsiance and metformin.  She is willing to and would like to try [facility]  again.  May need endo referral or addition of insulin if not tolerating mounjaro. Encouraged to work on dietary changes and incorporation of regular activity.  Hyperlipidemia Tolerating simvastatin well at current strength.  Will plan to continue this at current strength.  Essential hypertension Blood pressure is well-controlled with losartan at current strength.  Recommend continuation.   Meds ordered this encounter  Medications   tirzepatide (MOUNJARO) 5 MG/0.5ML Pen    Sig: Inject 5 mg into the skin once a week.    Dispense:  6 mL    Refill:  0    No follow-ups on file.    This visit occurred during the SARS-CoV-2 public health emergency.  Safety protocols were in place, including screening questions prior to the visit, additional usage of staff PPE, and extensive cleaning of exam room while observing appropriate contact time as indicated for disinfecting solutions.

## 2023-02-17 ENCOUNTER — Other Ambulatory Visit: Payer: Self-pay | Admitting: Family Medicine

## 2023-02-17 ENCOUNTER — Other Ambulatory Visit (HOSPITAL_COMMUNITY): Payer: Self-pay

## 2023-02-17 ENCOUNTER — Encounter (HOSPITAL_COMMUNITY): Payer: Self-pay

## 2023-02-17 ENCOUNTER — Other Ambulatory Visit: Payer: Self-pay

## 2023-02-18 ENCOUNTER — Other Ambulatory Visit (HOSPITAL_COMMUNITY): Payer: Self-pay

## 2023-02-18 ENCOUNTER — Other Ambulatory Visit: Payer: Self-pay

## 2023-02-18 MED ORDER — ONDANSETRON 8 MG PO TBDP
8.0000 mg | ORAL_TABLET | Freq: Three times a day (TID) | ORAL | 3 refills | Status: DC | PRN
  Filled 2023-02-18: qty 20, 7d supply, fill #0

## 2023-02-19 ENCOUNTER — Other Ambulatory Visit (HOSPITAL_COMMUNITY): Payer: Self-pay

## 2023-02-19 ENCOUNTER — Other Ambulatory Visit: Payer: Self-pay

## 2023-02-25 ENCOUNTER — Encounter: Payer: Self-pay | Admitting: Family Medicine

## 2023-03-08 ENCOUNTER — Other Ambulatory Visit (HOSPITAL_COMMUNITY): Payer: Self-pay

## 2023-03-08 DIAGNOSIS — Z1211 Encounter for screening for malignant neoplasm of colon: Secondary | ICD-10-CM | POA: Diagnosis not present

## 2023-03-08 DIAGNOSIS — R1084 Generalized abdominal pain: Secondary | ICD-10-CM | POA: Diagnosis not present

## 2023-03-08 DIAGNOSIS — R198 Other specified symptoms and signs involving the digestive system and abdomen: Secondary | ICD-10-CM | POA: Diagnosis not present

## 2023-03-08 DIAGNOSIS — R112 Nausea with vomiting, unspecified: Secondary | ICD-10-CM | POA: Diagnosis not present

## 2023-03-08 DIAGNOSIS — R6881 Early satiety: Secondary | ICD-10-CM | POA: Diagnosis not present

## 2023-03-08 DIAGNOSIS — R630 Anorexia: Secondary | ICD-10-CM | POA: Diagnosis not present

## 2023-03-08 DIAGNOSIS — R634 Abnormal weight loss: Secondary | ICD-10-CM | POA: Diagnosis not present

## 2023-03-08 MED ORDER — NA SULFATE-K SULFATE-MG SULF 17.5-3.13-1.6 GM/177ML PO SOLN
ORAL | 0 refills | Status: DC
  Filled 2023-03-08: qty 354, 1d supply, fill #0

## 2023-03-21 ENCOUNTER — Other Ambulatory Visit: Payer: Self-pay | Admitting: Family Medicine

## 2023-03-22 ENCOUNTER — Other Ambulatory Visit (HOSPITAL_COMMUNITY): Payer: Self-pay

## 2023-03-22 ENCOUNTER — Encounter: Payer: Self-pay | Admitting: Family Medicine

## 2023-03-22 ENCOUNTER — Other Ambulatory Visit: Payer: Self-pay

## 2023-03-22 MED ORDER — METFORMIN HCL ER 500 MG PO TB24
2000.0000 mg | ORAL_TABLET | Freq: Every day | ORAL | 1 refills | Status: DC
  Filled 2023-03-22: qty 360, 90d supply, fill #0
  Filled 2023-06-23: qty 360, 90d supply, fill #1

## 2023-03-23 ENCOUNTER — Other Ambulatory Visit (HOSPITAL_COMMUNITY): Payer: Self-pay

## 2023-03-23 ENCOUNTER — Encounter: Payer: Self-pay | Admitting: Family Medicine

## 2023-04-23 ENCOUNTER — Other Ambulatory Visit: Payer: Self-pay | Admitting: Family Medicine

## 2023-04-23 ENCOUNTER — Encounter: Payer: Self-pay | Admitting: Family Medicine

## 2023-04-23 ENCOUNTER — Other Ambulatory Visit: Payer: Self-pay

## 2023-04-23 ENCOUNTER — Other Ambulatory Visit (HOSPITAL_COMMUNITY): Payer: Self-pay

## 2023-04-23 MED ORDER — RYBELSUS 14 MG PO TABS
14.0000 mg | ORAL_TABLET | Freq: Every day | ORAL | 3 refills | Status: DC
  Filled 2023-04-23: qty 30, 30d supply, fill #0
  Filled 2023-06-17: qty 30, 30d supply, fill #1
  Filled 2023-07-30: qty 30, 30d supply, fill #2
  Filled 2023-09-30: qty 30, 30d supply, fill #3

## 2023-04-27 LAB — HM DIABETES EYE EXAM

## 2023-04-30 ENCOUNTER — Ambulatory Visit: Payer: Commercial Managed Care - PPO | Admitting: Family Medicine

## 2023-05-25 ENCOUNTER — Ambulatory Visit (INDEPENDENT_AMBULATORY_CARE_PROVIDER_SITE_OTHER): Payer: Commercial Managed Care - PPO | Admitting: Family Medicine

## 2023-05-25 ENCOUNTER — Encounter: Payer: Self-pay | Admitting: Family Medicine

## 2023-05-25 VITALS — BP 134/81 | HR 91 | Ht 64.0 in | Wt 206.0 lb

## 2023-05-25 DIAGNOSIS — E1169 Type 2 diabetes mellitus with other specified complication: Secondary | ICD-10-CM

## 2023-05-25 DIAGNOSIS — I10 Essential (primary) hypertension: Secondary | ICD-10-CM

## 2023-05-25 DIAGNOSIS — Z7984 Long term (current) use of oral hypoglycemic drugs: Secondary | ICD-10-CM

## 2023-05-25 DIAGNOSIS — E669 Obesity, unspecified: Secondary | ICD-10-CM | POA: Diagnosis not present

## 2023-05-25 LAB — POCT GLYCOSYLATED HEMOGLOBIN (HGB A1C): HbA1c, POC (controlled diabetic range): 9 % — AB (ref 0.0–7.0)

## 2023-05-25 NOTE — Assessment & Plan Note (Signed)
A1c is better controlled.  Continue rybelsus and encouraged continued dietary changes.

## 2023-05-25 NOTE — Assessment & Plan Note (Signed)
Blood pressure is well-controlled with losartan at current strength.  Recommend continuation.

## 2023-05-25 NOTE — Progress Notes (Signed)
Angela Casey - 62 y.o. female MRN [id number]  Date of birth: 05-06-1961  Subjective Chief Complaint  Patient presents with   Diabetes   Hypertension   Hyperlipidemia    HPI Angela Casey is a 62 y.o. female here today for follow up visit.   We tried restarting mounjaro at last visit but she did not tolerate this.  Rybelsus added back on.  Her diabetes has not been well controlled in several months.  She is tolerating Rybelsus well at this time.  A1c improved to 9.0%.  She is working on dietary changes as well since last visit.  ROS:  A comprehensive ROS was completed and negative except as noted per HPI  Allergies  Allergen Reactions   Livalo [Pitavastatin] Itching, Rash and Hives   Exenatide Nausea And Vomiting    diarrhea   Liraglutide     Victoza, Saxenda   Pollen Extract     Other reaction(s): sneezing   Tirzepatide Diarrhea    Mounjaro   Januvia [Sitagliptin] Diarrhea    Past Medical History:  Diagnosis Date   Allergy    Arthritis    knees   Asthma    asthmatic bronchitis   Diabetes mellitus without complication (HCC)    GERD (gastroesophageal reflux disease)    Hypercholesteremia     Past Surgical History:  Procedure Laterality Date   ANTERIOR CRUCIATE LIGAMENT REPAIR Right    BACK SURGERY     L5-S1   CARPAL TUNNEL RELEASE     DILATION AND CURETTAGE, DIAGNOSTIC / THERAPEUTIC     KNEE ARTHROSCOPY Left 06/28/2015   Procedure: Left Knee Arthroscopy and Debridement;  Surgeon: Cammy Copa, MD;  Location: [facility];  Service: Orthopedics;  Laterality: Left;   KNEE ARTHROSCOPY WITH MEDIAL MENISECTOMY Left 06/28/2015   Procedure: KNEE ARTHROSCOPY WITH MEDIAL MENISECTOMY;  Surgeon: Cammy Copa, MD;  [facility];  Service: Orthopedics;  Laterality: Left;   KNEE SURGERY      Social History   Socioeconomic History   Marital status: Divorced    Spouse name: Not on file   Number of children: 1   Years of  education: Not on file   Highest education level: Master's degree (e.g., MA, MS, MEng, MEd, MSW, MBA)  Occupational History   Not on file  Tobacco Use   Smoking status: Never   Smokeless tobacco: Never  Vaping Use   Vaping status: Never Used  Substance and Sexual Activity   Alcohol use: Yes    Alcohol/week: 0.0 - 1.0 standard drinks of alcohol    Comment: social   Drug use: No   Sexual activity: Not Currently    Partners: Male    Birth control/protection: Condom  Other Topics Concern   Not on file  Social History Narrative   Not on file   Social Drivers of Health   Financial Resource Strain: Low Risk  (05/24/2023)   Overall Financial Resource Strain (CARDIA)    Difficulty of Paying Living Expenses: Not hard at all  Food Insecurity: No Food Insecurity (05/24/2023)   Hunger Vital Sign    Worried About Running Out of Food in the Last Year: Never true    Ran Out of Food in the Last Year: Never true  Transportation Needs: No Transportation Needs (05/24/2023)   PRAPARE - Administrator, Civil Service (Medical): No    Lack of Transportation (Non-Medical): No  Physical Activity: Insufficiently Active (05/24/2023)   Exercise Vital  Sign    Days of Exercise per Week: 2 days    Minutes of Exercise per Session: 30 min  Stress: No Stress Concern Present (05/24/2023)   [employer] of Occupational Health - Occupational Stress Questionnaire    Feeling of Stress : Only a little  Social Connections: Moderately Integrated (05/24/2023)   Social Connection and Isolation Panel [NHANES]    Frequency of Communication with Friends and Family: Twice a week    Frequency of Social Gatherings with Friends and Family: Once a week    Attends Religious Services: More than 4 times per year    Active Member of [employer] or Organizations: Yes    Attends Banker Meetings: Not on file    Marital Status: Divorced    Family History  Problem Relation Age of Onset   Cancer  Mother        uterine   Diabetes Father    COPD Father    Asthma Father    Diabetes Paternal Grandfather    Breast cancer Paternal Grandmother        in 71's   Heart attack Paternal Grandmother    Diabetes Paternal Grandmother    Hypertension Maternal Grandmother    Heart attack Maternal Grandmother    Allergic rhinitis Neg Hx    Angioedema Neg Hx    Atopy Neg Hx    Eczema Neg Hx    Immunodeficiency Neg Hx    Urticaria Neg Hx     Health Maintenance  Topic Date Due   HIV Screening  Never done   Hepatitis C Screening  Never done   MAMMOGRAM  11/29/2022   COVID-19 Vaccine (5 - 2024-25 season) 02/07/2023   Diabetic kidney evaluation - eGFR measurement  03/10/2023   Diabetic kidney evaluation - Urine ACR  03/10/2023   HEMOGLOBIN A1C  11/23/2023   OPHTHALMOLOGY EXAM  04/26/2024   Cervical Cancer Screening (HPV/Pap Cotest)  05/22/2024   FOOT EXAM  05/24/2024   Colonoscopy  11/25/2024   DTaP/Tdap/Td (2 - Td or Tdap) 10/09/2031   INFLUENZA VACCINE  Completed   Zoster Vaccines- Shingrix  Completed   HPV VACCINES  Aged Out     ----------------------------------------------------------------------------------------------------------------------------------------------------------------------------------------------------------------- Physical Exam BP 134/81 (BP Location: Left Arm, Patient Position: Sitting, Cuff Size: Large)   Pulse 91   Ht 5\' 4"  (1.626 m)   Wt 206 lb (93.4 kg)   LMP 06/08/2010 (Within Months)   SpO2 98%   BMI 35.36 kg/m   Physical Exam Constitutional:      Appearance: Normal appearance.  HENT:     Head: Normocephalic and atraumatic.  Eyes:     General: No scleral icterus. Cardiovascular:     Rate and Rhythm: Normal rate and regular rhythm.  Pulmonary:     Effort: Pulmonary effort is normal.     Breath sounds: Normal breath sounds.  Neurological:     Mental Status: She is alert.  Psychiatric:        Mood and Affect: Mood normal.        Behavior:  Behavior normal.     ------------------------------------------------------------------------------------------------------------------------------------------------------------------------------------------------------------------- Assessment and Plan  Essential hypertension Blood pressure is well-controlled with losartan at current strength.  Recommend continuation.  Type 2 diabetes mellitus with obesity (HCC) A1c is better controlled.  Continue rybelsus and encouraged continued dietary changes.    No orders of the defined types were placed in this encounter.   Return in about 3 months (around 08/23/2023) for Type 2 Diabetes.    This visit  occurred during the SARS-CoV-2 public health emergency.  Safety protocols were in place, including screening questions prior to the visit, additional usage of staff PPE, and extensive cleaning of exam room while observing appropriate contact time as indicated for disinfecting solutions.

## 2023-06-07 DIAGNOSIS — Z1231 Encounter for screening mammogram for malignant neoplasm of breast: Secondary | ICD-10-CM | POA: Diagnosis not present

## 2023-06-07 DIAGNOSIS — Z1331 Encounter for screening for depression: Secondary | ICD-10-CM | POA: Diagnosis not present

## 2023-06-07 DIAGNOSIS — Z01419 Encounter for gynecological examination (general) (routine) without abnormal findings: Secondary | ICD-10-CM | POA: Diagnosis not present

## 2023-06-17 ENCOUNTER — Other Ambulatory Visit: Payer: Self-pay

## 2023-06-17 ENCOUNTER — Other Ambulatory Visit: Payer: Self-pay | Admitting: Family Medicine

## 2023-06-17 DIAGNOSIS — E782 Mixed hyperlipidemia: Secondary | ICD-10-CM

## 2023-06-17 DIAGNOSIS — E1165 Type 2 diabetes mellitus with hyperglycemia: Secondary | ICD-10-CM

## 2023-06-17 MED ORDER — LOSARTAN POTASSIUM 50 MG PO TABS
50.0000 mg | ORAL_TABLET | Freq: Every day | ORAL | 1 refills | Status: DC
Start: 2023-06-17 — End: 2023-12-01
  Filled 2023-06-17: qty 90, 90d supply, fill #0
  Filled 2023-07-30 – 2023-09-30 (×2): qty 90, 90d supply, fill #1

## 2023-06-17 MED ORDER — GLIPIZIDE 5 MG PO TABS
5.0000 mg | ORAL_TABLET | Freq: Every evening | ORAL | 1 refills | Status: DC
  Filled 2023-06-17: qty 90, 90d supply, fill #0
  Filled 2023-09-30: qty 90, 90d supply, fill #1

## 2023-06-18 ENCOUNTER — Other Ambulatory Visit: Payer: Self-pay

## 2023-06-24 ENCOUNTER — Other Ambulatory Visit: Payer: Self-pay

## 2023-07-30 ENCOUNTER — Encounter (HOSPITAL_COMMUNITY): Payer: Self-pay | Admitting: Pharmacist

## 2023-07-30 ENCOUNTER — Other Ambulatory Visit: Payer: Self-pay

## 2023-07-30 ENCOUNTER — Other Ambulatory Visit (HOSPITAL_COMMUNITY): Payer: Self-pay

## 2023-08-04 ENCOUNTER — Ambulatory Visit
Admission: EM | Admit: 2023-08-04 | Discharge: 2023-08-04 | Disposition: A | Discharge status: Home / Self Care | Payer: Commercial Managed Care - PPO | Attending: Internal Medicine | Admitting: Internal Medicine

## 2023-08-04 ENCOUNTER — Other Ambulatory Visit: Payer: Self-pay

## 2023-08-04 DIAGNOSIS — R051 Acute cough: Secondary | ICD-10-CM | POA: Diagnosis not present

## 2023-08-04 DIAGNOSIS — J45901 Unspecified asthma with (acute) exacerbation: Secondary | ICD-10-CM | POA: Diagnosis not present

## 2023-08-04 DIAGNOSIS — J3489 Other specified disorders of nose and nasal sinuses: Secondary | ICD-10-CM | POA: Diagnosis not present

## 2023-08-04 DIAGNOSIS — B9689 Other specified bacterial agents as the cause of diseases classified elsewhere: Secondary | ICD-10-CM

## 2023-08-04 DIAGNOSIS — U071 COVID-19: Secondary | ICD-10-CM

## 2023-08-04 DIAGNOSIS — H66001 Acute suppurative otitis media without spontaneous rupture of ear drum, right ear: Secondary | ICD-10-CM

## 2023-08-04 LAB — POC COVID19/FLU A&B COMBO
Covid Antigen, POC: POSITIVE — AB
Influenza A Antigen, POC: NEGATIVE
Influenza B Antigen, POC: NEGATIVE

## 2023-08-04 MED ORDER — ALBUTEROL SULFATE (2.5 MG/3ML) 0.083% IN NEBU: 2.5000 mg | INHALATION_SOLUTION | Freq: Four times a day (QID) | RESPIRATORY_TRACT | 2 refills | Status: AC | PRN

## 2023-08-04 MED ORDER — IPRATROPIUM-ALBUTEROL 0.5-2.5 (3) MG/3ML IN SOLN
3.0000 mL | Freq: Once | RESPIRATORY_TRACT | Status: AC
  Administered 2023-08-04: 3 mL via RESPIRATORY_TRACT

## 2023-08-04 MED ORDER — MOLNUPIRAVIR 200 MG PO CAPS: 4.0000 | ORAL_CAPSULE | Freq: Two times a day (BID) | ORAL | 0 refills | Status: AC

## 2023-08-04 MED ORDER — ALBUTEROL SULFATE HFA 108 (90 BASE) MCG/ACT IN AERS: 2.0000 | INHALATION_SPRAY | Freq: Four times a day (QID) | RESPIRATORY_TRACT | 2 refills | Status: AC | PRN

## 2023-08-04 MED ORDER — BENZONATATE 200 MG PO CAPS: 200.0000 mg | ORAL_CAPSULE | Freq: Three times a day (TID) | ORAL | 0 refills | Status: DC | PRN

## 2023-08-04 MED ORDER — MUPIROCIN 2 % EX OINT: 1.0000 | TOPICAL_OINTMENT | Freq: Three times a day (TID) | CUTANEOUS | 0 refills | Status: AC | PRN

## 2023-08-04 MED ORDER — METHYLPREDNISOLONE 4 MG PO TBPK: ORAL_TABLET | ORAL | 0 refills | Status: DC

## 2023-08-04 MED ORDER — AMOXICILLIN-POT CLAVULANATE 875-125 MG PO TABS: 1.0000 | ORAL_TABLET | Freq: Two times a day (BID) | ORAL | 0 refills | Status: AC

## 2023-08-04 NOTE — Discharge Instructions (Addendum)
 You are positive for COVID. COVID is a virus. Recommend you rest and increase oral fluids. I have sent several medications to your pharmacy including refills of your inhaler, your solution for you nebulizer, and a cough medicine. I have also sent an antibiotic called Augmentin for your ear. Finally, I sent a steroid pack to your pharmacy as well to help with your asthma.   A prescription (Lagevrio) was sent to your pharmacy to help with the duration of symptoms. Tylenol/Ibuprofen as needed for fevers and aches. It is important for you to pay attention to any new symptoms or worsening of your current condition. Please go directly to the Emergency Department immediately should you have any of the following symptoms: chest pain, shortness of breath or difficulty breathing.  You should quarantine until you are fever free for at least 24 hours without taking tylenol or ibuprofen and you are improving. Once you are out of quarantine, you should continue wearing a mask for the next 5 days when out in public.

## 2023-08-04 NOTE — ED Triage Notes (Signed)
 C/O productive cough with yellow sputum, wheezing and fever. Onset last night. Patient states fever 100.5 last night. C/O headache. Patient states she feels short of breath.

## 2023-08-04 NOTE — ED Provider Notes (Signed)
 [facility]  Note:  This document was prepared using Dragon voice recognition software and may include unintentional dictation errors.  MRN: [id number] DOB: [birth year] DATE: 08/04/23   Subjective:  Chief Complaint:  Chief Complaint  Patient presents with   Cough     HPI: Angela Casey is a 63 y.o. female presenting for productive cough and fever for the past 2-3 days. Patient reports ongoing post nasal drip for the past week. She states she suddenly started feeling worse earlier in the week. She reports productive cough with wheezing. She states she used her rescue inhaler which helped her symptoms. She was doing better until last night she started with fever and chills as well as some myalgias. She reports max temperature of 100.5. She has been using her inhaler more as well as taking Advil with some relief. No known sick contacts. Of note, prior to discharge patient also reports lesion in her left nostril that has been ongoing for several weeks. She describes it as an abrasion. She has been apply neosporin with no relief. Denies nausea/vomiting, abdominal pain, sore throat. Endorses fever, cough, congestion, headache, otalgia. Presents NAD.  Prior to Admission medications   Medication Sig Start Date End Date Taking? Authorizing Provider  albuterol (VENTOLIN HFA) 108 (90 Base) MCG/ACT inhaler Inhale 2 puffs into the lungs every 6 (six) hours as needed for wheezing or shortness of breath. 05/08/22   Freddy Finner, NP  Blood Glucose Monitoring Suppl (FREESTYLE LITE) DEVI Use to check blood sugar 3 times a day 08/30/19   Carlus Pavlov, MD  Calcium Carb-Cholecalciferol (CALCIUM 600+D) 600-10 MG-MCG TABS Take 1 tablet by mouth 2 (two) times daily. Patient not taking: Reported on 05/25/2023 02/25/22   Monica Becton, MD  empagliflozin (JARDIANCE) 25 MG TABS tablet Take 1 tablet (25 mg total) by mouth daily before breakfast. 11/30/22 11/30/23  Everrett Coombe, DO  glipiZIDE  (GLUCOTROL) 5 MG tablet Take 1 tablet (5 mg total) by mouth every evening before dinner. 06/17/23   Everrett Coombe, DO  glucose blood (FREESTYLE LITE) test strip USE TO CHECK BLOOD SUGAR 3 TIMES A DAY. 09/08/22 09/20/23  Everrett Coombe, DO  Lancets (FREESTYLE) lancets Use to check blood sugar 3 times a day 08/30/19   Carlus Pavlov, MD  losartan (COZAAR) 50 MG tablet Take 1 tablet (50 mg total) by mouth daily. 06/17/23   Everrett Coombe, DO  metFORMIN (GLUCOPHAGE-XR) 500 MG 24 hr tablet Take 4 tablets (2,000 mg total) by mouth daily. 03/22/23   Everrett Coombe, DO  Semaglutide (RYBELSUS) 14 MG TABS Take 1 tablet (14 mg total) by mouth daily. 04/23/23   Everrett Coombe, DO  simvastatin (ZOCOR) 20 MG tablet Take 1 tablet by mouth daily. 08/05/22 09/20/23  Everrett Coombe, DO     Allergies  Allergen Reactions   Livalo [Pitavastatin] Itching, Rash and Hives   Exenatide Nausea And Vomiting    diarrhea   Liraglutide     Victoza, Saxenda   Pollen Extract     Other reaction(s): sneezing   Tirzepatide Diarrhea    Mounjaro   Januvia [Sitagliptin] Diarrhea    History:   Past Medical History:  Diagnosis Date   Allergy    Arthritis    knees   Asthma    asthmatic bronchitis   Diabetes mellitus without complication (HCC)    GERD (gastroesophageal reflux disease)    Hypercholesteremia      Past Surgical History:  Procedure Laterality Date   ANTERIOR CRUCIATE LIGAMENT REPAIR Right  BACK SURGERY     L5-S1   CARPAL TUNNEL RELEASE     DILATION AND CURETTAGE, DIAGNOSTIC / THERAPEUTIC     KNEE ARTHROSCOPY Left 06/28/2015   Procedure: Left Knee Arthroscopy and Debridement;  Surgeon: Cammy Copa, MD;  Location: [facility];  Service: Orthopedics;  Laterality: Left;   KNEE ARTHROSCOPY WITH MEDIAL MENISECTOMY Left 06/28/2015   Procedure: KNEE ARTHROSCOPY WITH MEDIAL MENISECTOMY;  Surgeon: Cammy Copa, MD;  Location: [facility];  Service: Orthopedics;  Laterality:  Left;   KNEE SURGERY      Family History  Problem Relation Age of Onset   Cancer Mother        uterine   Diabetes Father    COPD Father    Asthma Father    Diabetes Paternal Grandfather    Breast cancer Paternal Grandmother        in 55's   Heart attack Paternal Grandmother    Diabetes Paternal Grandmother    Hypertension Maternal Grandmother    Heart attack Maternal Grandmother    Allergic rhinitis Neg Hx    Angioedema Neg Hx    Atopy Neg Hx    Eczema Neg Hx    Immunodeficiency Neg Hx    Urticaria Neg Hx     Social History   Tobacco Use   Smoking status: Never   Smokeless tobacco: Never  Vaping Use   Vaping status: Never Used  Substance Use Topics   Alcohol use: Yes    Alcohol/week: 0.0 - 1.0 standard drinks of alcohol    Comment: social   Drug use: No    Review of Systems  Constitutional:  Positive for chills, fatigue and fever.  HENT:  Positive for congestion, ear pain, postnasal drip and rhinorrhea. Negative for sore throat.   Respiratory:  Positive for cough, shortness of breath and wheezing.   Gastrointestinal:  Negative for abdominal pain, nausea and vomiting.  Musculoskeletal:  Positive for myalgias.  Neurological:  Positive for headaches.     Objective:   Vitals: BP 122/78 (BP Location: Right Arm)   Pulse (!) 104   Temp 98.8 F (37.1 C) (Oral)   Resp 20   LMP 06/08/2010 (Within Months)   SpO2 97%   Physical Exam Constitutional:      General: She is not in acute distress.    Appearance: Normal appearance. She is well-developed. She is obese. She is not ill-appearing or toxic-appearing.  HENT:     Head: Normocephalic and atraumatic.     Right Ear: A middle ear effusion is present.     Left Ear: Ear canal normal. A middle ear effusion is present.     Ears:     Comments: R TM effusion with discolored fluid and bulging    Nose: Rhinorrhea present. Rhinorrhea is clear.     Mouth/Throat:     Pharynx: Oropharynx is clear. Uvula midline.  Posterior oropharyngeal erythema present. No pharyngeal swelling or oropharyngeal exudate.     Tonsils: No tonsillar exudate or tonsillar abscesses.  Cardiovascular:     Rate and Rhythm: Normal rate and regular rhythm.     Heart sounds: Normal heart sounds.  Pulmonary:     Effort: Pulmonary effort is normal.     Breath sounds: Normal breath sounds.     Comments: Clear to auscultation bilaterally  Abdominal:     General: Bowel sounds are normal.     Palpations: Abdomen is soft.     Tenderness: There is no  abdominal tenderness.  Skin:    General: Skin is warm and dry.  Neurological:     General: No focal deficit present.     Mental Status: She is alert.  Psychiatric:        Mood and Affect: Mood and affect normal.     Results:  Labs: Results for orders placed or performed during the hospital encounter of 08/04/23 (from the past 24 hours)  POC Covid19/Flu A&B Antigen     Status: Abnormal   Collection Time: 08/04/23 11:08 AM  Result Value Ref Range   Influenza A Antigen, POC Negative Negative   Influenza B Antigen, POC Negative Negative   Covid Antigen, POC Positive (A) Negative    Radiology: No results found.   UC Course/Treatments:  Procedures: Procedures   Medications Ordered in UC: Medications  ipratropium-albuterol (DUONEB) 0.5-2.5 (3) MG/3ML nebulizer solution 3 mL (3 mLs Nebulization Given 08/04/23 1055)     Assessment and Plan :     ICD-10-CM   1. COVID-19  U07.1     2. Acute cough  R05.1     3. Non-recurrent acute suppurative otitis media of right ear without spontaneous rupture of tympanic membrane  H66.001     4. Nasal vestibulitis  J34.89     5. Acute bacterial sinusitis  J01.90 albuterol (VENTOLIN HFA) 108 (90 Base) MCG/ACT inhaler   B96.89     6. Asthma with acute exacerbation, unspecified asthma severity, unspecified whether persistent  J45.901     COVID-19 Afebrile, nontoxic-appearing, NAD. VSS. DDX includes but not limited to: COVID, flu,  bronchitis, pneumonia, viral URI COVID was positive today in office. Benzonatate 200mg  TID PRN was prescribed for cough. Patient is unsure about starting antiviral therapy at this time, but RX was provided and patient will decided. Lagevrio 800mg  BID was prescribed. Strict ED precautions were given and patient verbalized understanding.  Acute cough Afebrile, nontoxic-appearing, NAD. VSS. DDX includes but not limited to: COVID, flu, bronchitis, pneumonia, viral URI COVID was positive today in office. Benzonatate 200mg  TID PRN was prescribed for cough. Strict ED precautions were given and patient verbalized understanding.  Non-Recurrent Acute Suppurative Otitis Media of Right Ear without Spontaneous Rupture of Tympanic Membrane Afebrile, nontoxic-appearing, NAD. VSS. DDX includes but not limited to: otitis media, otitis externa, eustachian tube dysfunction, cerumen impaction R TM bulging with opacified/discolored fluid noted. Augmentin 875mg  BID was prescribed. Recommend OTC analgesics as needed for pain. Strict ED precautions were given and patient verbalized understanding.  Nasal Vestibulitis Afebrile, nontoxic-appearing, NAD. VSS. DDX includes but not limited to: nasal vestibulitis, cellulitis, abrasion, abscess Mupirocin 2% BID was prescribed for ongoing abrasion. Strict ED precautions were given and patient verbalized understanding.  Asthma with acute exacerbation, unspecified asthma severity, unspecified whether persistent  Afebrile, nontoxic-appearing, NAD. VSS. Prior history of asthma. DuoNeb given in office and patient reported improvement. Albuterol inhaler 2 puffs every 6 hours PRN for shortness of breath or wheezing was refilled as well as she was given Albuterol nebulizer solution every 6 hours PRN for her machine. She was also given a Medrol DosePak for current exacerbation. She was instructed to monitor her glucose closely and increase fluids. Strict ED precautions were given and  patient verbalized understanding.  ED Discharge Orders          Ordered    albuterol (PROVENTIL) (2.5 MG/3ML) 0.083% nebulizer solution  Every 6 hours PRN        08/04/23 1112    albuterol (VENTOLIN HFA) 108 (90 Base) MCG/ACT inhaler  Every 6 hours PRN        08/04/23 1118    amoxicillin-clavulanate (AUGMENTIN) 875-125 MG tablet  Every 12 hours        08/04/23 1119    benzonatate (TESSALON) 200 MG capsule  3 times daily PRN        08/04/23 1119    mupirocin ointment (BACTROBAN) 2 %  3 times daily PRN        08/04/23 1119    molnupiravir EUA (LAGEVRIO) 200 MG CAPS capsule  2 times daily        08/04/23 1120    methylPREDNISolone (MEDROL DOSEPAK) 4 MG TBPK tablet        08/04/23 1123             PDMP not reviewed this encounter.      Cynda Acres, PA-C 08/04/23 1152

## 2023-08-24 ENCOUNTER — Ambulatory Visit: Payer: Commercial Managed Care - PPO | Admitting: Family Medicine

## 2023-08-25 ENCOUNTER — Encounter: Payer: Self-pay | Admitting: Podiatry

## 2023-08-25 ENCOUNTER — Ambulatory Visit: Payer: Commercial Managed Care - PPO | Admitting: Podiatry

## 2023-08-25 ENCOUNTER — Other Ambulatory Visit: Payer: Self-pay

## 2023-08-25 VITALS — Ht 64.0 in | Wt 206.0 lb

## 2023-08-25 DIAGNOSIS — G5761 Lesion of plantar nerve, right lower limb: Secondary | ICD-10-CM | POA: Diagnosis not present

## 2023-08-25 MED ORDER — METHYLPREDNISOLONE 4 MG PO TBPK
ORAL_TABLET | ORAL | 0 refills | Status: DC
  Filled 2023-08-25: qty 21, 6d supply, fill #0

## 2023-08-25 NOTE — Progress Notes (Unsigned)
 Chief Complaint  Patient presents with   Injections     Need another shot in my right foot for nerve pain. Going to [place] for a week of golf on 08/29/23.     HPI: 63 y.o. female presenting today for recurrence of right forefoot pain secondary to a Morton's neuroma.  Patient last seen in the office on 09/08/2021 approximately 1 year ago.  Patient states that she was doing very well until she reaggravated her foot.  It has been painful and tender over the past few months.  Past Medical History:  Diagnosis Date   Allergy    Arthritis    knees   Asthma    asthmatic bronchitis   Diabetes mellitus without complication (HCC)    GERD (gastroesophageal reflux disease)    Hypercholesteremia    Past Surgical History:  Procedure Laterality Date   ANTERIOR CRUCIATE LIGAMENT REPAIR Right    BACK SURGERY     L5-S1   CARPAL TUNNEL RELEASE     DILATION AND CURETTAGE, DIAGNOSTIC / THERAPEUTIC     KNEE ARTHROSCOPY Left 06/28/2015   Procedure: Left Knee Arthroscopy and Debridement;  Surgeon: Cammy Copa, MD;  Location: [facility];  Service: Orthopedics;  Laterality: Left;   KNEE ARTHROSCOPY WITH MEDIAL MENISECTOMY Left 06/28/2015   Procedure: KNEE ARTHROSCOPY WITH MEDIAL MENISECTOMY;  Surgeon: Cammy Copa, MD;  Location: [facility];  Service: Orthopedics;  Laterality: Left;   KNEE SURGERY     Allergies  Allergen Reactions   Livalo [Pitavastatin] Itching, Rash and Hives   Exenatide Nausea And Vomiting    diarrhea   Liraglutide     Victoza, Saxenda   Pollen Extract     Other reaction(s): sneezing   Tirzepatide Diarrhea    Mounjaro   Januvia [Sitagliptin] Diarrhea    Physical Exam: General: The patient is alert and oriented x3 in no acute distress.  Dermatology: Skin is warm, dry and supple bilateral lower extremities. Negative for open lesions or macerations.  Vascular: Palpable pedal pulses bilaterally. No edema or erythema noted. Capillary  refill within normal limits.  Neurological: Light touch and protective threshold grossly intact bilaterally.   Musculoskeletal Exam: Range of motion within normal limits to all pedal and ankle joints bilateral. Muscle strength 5/5 in all groups bilateral.  There is pain on palpation to the third interspace right foot with a positive Mulder sign.  Pain with lateral compression of the metatarsal heads also noted.  Assessment: 1.  Morton's neuroma right third interspace   Plan of Care:  1. Patient evaluated.  The patient had very good success that seem to last for several months with her treatment regimen from last year.  We will continue to do the same as long as she continues to have lasting alleviation of her symptoms with the conservative treatment 2.  Injection 0.5 cc Celestone Soluspan injected into the third interspace right foot 3.  Prescription for a Medrol Dosepak 4.  Prescription for meloxicam 15 mg daily after completion of the Dosepak 5.  Recommend wide fitting shoes that do not compress the forefoot or toebox area 6.  Return to clinic as needed      Felecia Shelling, DPM [facility]  Dr. Felecia Shelling, DPM    [address]  [place], [place] [address]                Office [phone]  Fax [phone]

## 2023-08-26 DIAGNOSIS — G5761 Lesion of plantar nerve, right lower limb: Secondary | ICD-10-CM

## 2023-08-26 MED ORDER — BETAMETHASONE SOD PHOS & ACET 6 (3-3) MG/ML IJ SUSP
3.0000 mg | Freq: Once | INTRAMUSCULAR | Status: AC
  Administered 2023-08-26: 3 mg via INTRA_ARTICULAR

## 2023-09-30 ENCOUNTER — Other Ambulatory Visit (HOSPITAL_COMMUNITY): Payer: Self-pay

## 2023-09-30 ENCOUNTER — Other Ambulatory Visit: Payer: Self-pay | Admitting: Family Medicine

## 2023-09-30 ENCOUNTER — Other Ambulatory Visit: Payer: Self-pay

## 2023-09-30 ENCOUNTER — Ambulatory Visit: Admitting: Family Medicine

## 2023-09-30 MED ORDER — SIMVASTATIN 20 MG PO TABS
20.0000 mg | ORAL_TABLET | Freq: Every day | ORAL | 3 refills | Status: AC
  Filled 2023-09-30: qty 90, 90d supply, fill #0
  Filled 2023-11-23 – 2024-01-25 (×2): qty 90, 90d supply, fill #1
  Filled 2024-04-19: qty 90, 90d supply, fill #2

## 2023-09-30 MED ORDER — METFORMIN HCL ER 500 MG PO TB24
2000.0000 mg | ORAL_TABLET | Freq: Every day | ORAL | 1 refills | Status: DC
  Filled 2023-09-30: qty 360, 90d supply, fill #0
  Filled 2023-12-22: qty 360, 90d supply, fill #1

## 2023-11-23 ENCOUNTER — Ambulatory Visit: Admitting: Family Medicine

## 2023-11-23 ENCOUNTER — Other Ambulatory Visit: Payer: Self-pay

## 2023-11-23 ENCOUNTER — Encounter: Payer: Self-pay | Admitting: Family Medicine

## 2023-11-23 VITALS — BP 102/70 | HR 97 | Ht 64.0 in | Wt 206.0 lb

## 2023-11-23 DIAGNOSIS — Z114 Encounter for screening for human immunodeficiency virus [HIV]: Secondary | ICD-10-CM | POA: Diagnosis not present

## 2023-11-23 DIAGNOSIS — Z1159 Encounter for screening for other viral diseases: Secondary | ICD-10-CM

## 2023-11-23 DIAGNOSIS — Z7984 Long term (current) use of oral hypoglycemic drugs: Secondary | ICD-10-CM

## 2023-11-23 DIAGNOSIS — E782 Mixed hyperlipidemia: Secondary | ICD-10-CM

## 2023-11-23 DIAGNOSIS — R350 Frequency of micturition: Secondary | ICD-10-CM | POA: Diagnosis not present

## 2023-11-23 DIAGNOSIS — Z23 Encounter for immunization: Secondary | ICD-10-CM | POA: Diagnosis not present

## 2023-11-23 DIAGNOSIS — I10 Essential (primary) hypertension: Secondary | ICD-10-CM | POA: Diagnosis not present

## 2023-11-23 DIAGNOSIS — E1169 Type 2 diabetes mellitus with other specified complication: Secondary | ICD-10-CM | POA: Diagnosis not present

## 2023-11-23 DIAGNOSIS — E669 Obesity, unspecified: Secondary | ICD-10-CM

## 2023-11-23 LAB — POCT GLYCOSYLATED HEMOGLOBIN (HGB A1C): HbA1c, POC (controlled diabetic range): 9.6 % — AB (ref 0.0–7.0)

## 2023-11-23 MED ORDER — RYBELSUS 14 MG PO TABS
14.0000 mg | ORAL_TABLET | Freq: Every day | ORAL | 1 refills | Status: AC
Start: 2023-11-23 — End: ?
  Filled 2023-11-23: qty 90, 90d supply, fill #0
  Filled 2024-02-16: qty 90, 90d supply, fill #1

## 2023-11-23 MED ORDER — RYBELSUS 14 MG PO TABS
14.0000 mg | ORAL_TABLET | Freq: Every day | ORAL | 1 refills | Status: DC
  Filled 2023-11-23: qty 90, 90d supply, fill #0

## 2023-11-23 NOTE — Progress Notes (Signed)
 Angela Casey - 63 y.o. female MRN [id number]  Date of birth: [birth year]  Subjective Chief Complaint  Patient presents with   Diabetes    HPI Angela Casey is a 63 year old female here today for follow-up visit.  Reports overall she is doing pretty well.  Continues on Rybelsus  14 mg daily, glipizide , metformin  and Jardiance .  Overall she is tolerating medications pretty well.  She does have some constipation with Rybelsus .  A1c has increased some since last visit, currently at 9.6%.  She admits to eating ice cream frequently.  She does try to stay fairly active.  She does think she can make changes to her diet.  Blood pressure is well-controlled with losartan  at current strength.  She denies side effects of medication.  She has not had chest pain, shortness of breath, palpitations, headaches or vision change.    ROS:  A comprehensive ROS was completed and negative except as noted per HPI  Allergies  Allergen Reactions   Livalo  [Pitavastatin ] Itching, Rash and Hives   Exenatide Nausea And Vomiting    diarrhea   Liraglutide      Victoza , Saxenda    Pollen Extract     Other reaction(s): sneezing   Tirzepatide  Diarrhea    Mounjaro    Januvia  [Sitagliptin ] Diarrhea    Past Medical History:  Diagnosis Date   Allergy    Arthritis    knees   Asthma    asthmatic bronchitis   Diabetes mellitus without complication (HCC)    GERD (gastroesophageal reflux disease)    Hypercholesteremia     Past Surgical History:  Procedure Laterality Date   ANTERIOR CRUCIATE LIGAMENT REPAIR Right    BACK SURGERY     L5-S1   CARPAL TUNNEL RELEASE     DILATION AND CURETTAGE, DIAGNOSTIC / THERAPEUTIC     KNEE ARTHROSCOPY Left 06/28/2015   Procedure: Left Knee Arthroscopy and Debridement;  Surgeon: Jasmine Mesi, MD;  Location: [facility];  Service: Orthopedics;  Laterality: Left;   KNEE ARTHROSCOPY WITH MEDIAL MENISECTOMY Left 06/28/2015   Procedure: KNEE ARTHROSCOPY WITH MEDIAL  MENISECTOMY;  Surgeon: Jasmine Mesi, MD;  Location: [facility];  Service: Orthopedics;  Laterality: Left;   KNEE SURGERY      Social History   Socioeconomic History   Marital status: Divorced    Spouse name: Not on file   Number of children: 1   Years of education: Not on file   Highest education level: Master's degree (e.g., MA, MS, MEng, MEd, MSW, MBA)  Occupational History   Not on file  Tobacco Use   Smoking status: Never   Smokeless tobacco: Never  Vaping Use   Vaping status: Never Used  Substance and Sexual Activity   Alcohol use: Yes    Alcohol/week: 0.0 - 1.0 standard drinks of alcohol    Comment: social   Drug use: No   Sexual activity: Not Currently    Partners: Male    Birth control/protection: Condom  Other Topics Concern   Not on file  Social History Narrative   Not on file   Social Drivers of Health   Financial Resource Strain: Low Risk  (11/22/2023)   Overall Financial Resource Strain (CARDIA)    Difficulty of Paying Living Expenses: Not hard at all  Food Insecurity: No Food Insecurity (11/22/2023)   Hunger Vital Sign    Worried About Running Out of Food in the Last Year: Never true    Ran Out of Food in the  Last Year: Never true  Transportation Needs: No Transportation Needs (11/22/2023)   PRAPARE - Administrator, Civil Service (Medical): No    Lack of Transportation (Non-Medical): No  Physical Activity: Insufficiently Active (11/22/2023)   Exercise Vital Sign    Days of Exercise per Week: 3 days    Minutes of Exercise per Session: 30 min  Stress: No Stress Concern Present (11/22/2023)   [employer] of Occupational Health - Occupational Stress Questionnaire    Feeling of Stress: Only a little  Social Connections: Moderately Isolated (11/22/2023)   Social Connection and Isolation Panel    Frequency of Communication with Friends and Family: Twice a week    Frequency of Social Gatherings with Friends and Family: Once  a week    Attends Religious Services: More than 4 times per year    Active Member of Clubs or Organizations: No    Attends Engineer, structural: Not on file    Marital Status: Divorced    Family History  Problem Relation Age of Onset   Cancer Mother        uterine   Diabetes Father    COPD Father    Asthma Father    Diabetes Paternal Grandfather    Breast cancer Paternal Grandmother        in 3's   Heart attack Paternal Grandmother    Diabetes Paternal Grandmother    Hypertension Maternal Grandmother    Heart attack Maternal Grandmother    Allergic rhinitis Neg Hx    Angioedema Neg Hx    Atopy Neg Hx    Eczema Neg Hx    Immunodeficiency Neg Hx    Urticaria Neg Hx     Health Maintenance  Topic Date Due   HIV Screening  Never done   Hepatitis C Screening  Never done   MAMMOGRAM  11/29/2022   COVID-19 Vaccine (5 - 2024-25 season) 02/07/2023   Diabetic kidney evaluation - eGFR measurement  03/10/2023   Diabetic kidney evaluation - Urine ACR  03/10/2023   INFLUENZA VACCINE  01/07/2024   OPHTHALMOLOGY EXAM  04/26/2024   Cervical Cancer Screening (HPV/Pap Cotest)  05/22/2024   FOOT EXAM  05/24/2024   HEMOGLOBIN A1C  05/24/2024   Colonoscopy  11/25/2024   DTaP/Tdap/Td (2 - Td or Tdap) 10/09/2031   Pneumococcal Vaccine 13-75 Years old  Completed   Zoster Vaccines- Shingrix   Completed   HPV VACCINES  Aged Out   Meningococcal B Vaccine  Aged Out     ----------------------------------------------------------------------------------------------------------------------------------------------------------------------------------------------------------------- Physical Exam BP 102/70 (BP Location: Left Arm, Patient Position: Sitting, Cuff Size: Large)   Pulse 97   Ht 5' 4 (1.626 m)   Wt 206 lb (93.4 kg)   LMP 06/08/2010 (Within Months)   SpO2 98%   BMI 35.36 kg/m   Physical Exam Constitutional:      Appearance: Normal appearance.   Cardiovascular:      Rate and Rhythm: Normal rate and regular rhythm.  Pulmonary:     Effort: Pulmonary effort is normal.     Breath sounds: Normal breath sounds.   Neurological:     General: No focal deficit present.     Mental Status: She is alert.   Psychiatric:        Mood and Affect: Mood normal.        Behavior: Behavior normal.     ------------------------------------------------------------------------------------------------------------------------------------------------------------------------------------------------------------------- Assessment and Plan  Type 2 diabetes mellitus with obesity (HCC) Diabetes remains uncontrolled.  She does feel like she  can make changes to her diet to improve her sugars.  Will continue current medications but discussed that we may need to add some insulin  at next visit if this remains elevated.  She is understanding of this.    Essential hypertension Blood pressure is well-controlled with losartan  at current strength.  Recommend continuation.   Meds ordered this encounter  Medications   DISCONTD: Semaglutide  (RYBELSUS ) 14 MG TABS    Sig: Take 1 tablet (14 mg total) by mouth daily.    Dispense:  90 tablet    Refill:  1   Semaglutide  (RYBELSUS ) 14 MG TABS    Sig: Take 1 tablet (14 mg total) by mouth daily.    Dispense:  90 tablet    Refill:  1    Return in about 4 months (around 03/24/2024) for Type 2 Diabetes.

## 2023-11-23 NOTE — Assessment & Plan Note (Signed)
 Diabetes remains uncontrolled.  She does feel like she can make changes to her diet to improve her sugars.  Will continue current medications but discussed that we may need to add some insulin  at next visit if this remains elevated.  She is understanding of this.

## 2023-11-23 NOTE — Assessment & Plan Note (Signed)
Blood pressure is well-controlled with losartan at current strength.  Recommend continuation.

## 2023-11-24 LAB — CMP14+EGFR
ALT: 32 IU/L (ref 0–32)
AST: 22 IU/L (ref 0–40)
Albumin: 4.6 g/dL (ref 3.9–4.9)
Alkaline Phosphatase: 82 IU/L (ref 44–121)
BUN/Creatinine Ratio: 26 (ref 12–28)
BUN: 17 mg/dL (ref 8–27)
Bilirubin Total: 0.3 mg/dL (ref 0.0–1.2)
CO2: 18 mmol/L — ABNORMAL LOW (ref 20–29)
Calcium: 9.7 mg/dL (ref 8.7–10.3)
Chloride: 100 mmol/L (ref 96–106)
Creatinine, Ser: 0.66 mg/dL (ref 0.57–1.00)
Globulin, Total: 2.4 g/dL (ref 1.5–4.5)
Glucose: 149 mg/dL — ABNORMAL HIGH (ref 70–99)
Potassium: 4.6 mmol/L (ref 3.5–5.2)
Sodium: 139 mmol/L (ref 134–144)
Total Protein: 7 g/dL (ref 6.0–8.5)
eGFR: 100 mL/min/{1.73_m2} (ref >59)

## 2023-11-24 LAB — CBC WITH DIFFERENTIAL/PLATELET
Basophils Absolute: 0 10*3/uL (ref 0.0–0.2)
Basos: 0 %
EOS (ABSOLUTE): 0.3 10*3/uL (ref 0.0–0.4)
Eos: 4 %
Hematocrit: 43.2 % (ref 34.0–46.6)
Hemoglobin: 13.5 g/dL (ref 11.1–15.9)
Immature Grans (Abs): 0 10*3/uL (ref 0.0–0.1)
Immature Granulocytes: 0 %
Lymphocytes Absolute: 2.5 10*3/uL (ref 0.7–3.1)
Lymphs: 34 %
MCH: 28.9 pg (ref 26.6–33.0)
MCHC: 31.3 g/dL — ABNORMAL LOW (ref 31.5–35.7)
MCV: 93 fL (ref 79–97)
Monocytes Absolute: 0.7 10*3/uL (ref 0.1–0.9)
Monocytes: 10 %
Neutrophils Absolute: 3.9 10*3/uL (ref 1.4–7.0)
Neutrophils: 52 %
Platelets: 331 10*3/uL (ref 150–450)
RBC: 4.67 x10E6/uL (ref 3.77–5.28)
RDW: 12.7 % (ref 11.7–15.4)
WBC: 7.4 10*3/uL (ref 3.4–10.8)

## 2023-11-24 LAB — UA/M W/RFLX CULTURE, ROUTINE
Bilirubin, UA: NEGATIVE
Ketones, UA: NEGATIVE
Leukocytes,UA: NEGATIVE
Nitrite, UA: NEGATIVE
Protein,UA: NEGATIVE
RBC, UA: NEGATIVE
Specific Gravity, UA: >=1.03 — AB (ref 1.005–1.030)
Urobilinogen, Ur: 0.2 mg/dL (ref 0.2–1.0)
pH, UA: 5.5 (ref 5.0–7.5)

## 2023-11-24 LAB — MICROSCOPIC EXAMINATION
Bacteria, UA: NONE SEEN
Casts: NONE SEEN /LPF
WBC, UA: NONE SEEN /HPF (ref 0–5)

## 2023-11-24 LAB — LIPID PANEL
Chol/HDL Ratio: 4.1 ratio (ref 0.0–4.4)
Cholesterol, Total: 172 mg/dL (ref 100–199)
HDL: 42 mg/dL (ref >39)
LDL Chol Calc (NIH): 96 mg/dL (ref 0–99)
Triglycerides: 198 mg/dL — ABNORMAL HIGH (ref 0–149)
VLDL Cholesterol Cal: 34 mg/dL (ref 5–40)

## 2023-11-24 LAB — MICROALBUMIN / CREATININE URINE RATIO
Creatinine, Urine: 53.3 mg/dL
Microalb/Creat Ratio: <6 mg/g{creat} (ref 0–29)
Microalbumin, Urine: <3 ug/mL

## 2023-11-30 ENCOUNTER — Other Ambulatory Visit: Payer: Self-pay

## 2023-11-30 ENCOUNTER — Other Ambulatory Visit: Payer: Self-pay | Admitting: Family Medicine

## 2023-11-30 MED ORDER — FREESTYLE LITE TEST VI STRP
ORAL_STRIP | 12 refills | Status: AC
  Filled 2023-11-30: qty 300, 90d supply, fill #0
  Filled 2024-03-23: qty 300, 90d supply, fill #1

## 2023-12-01 ENCOUNTER — Other Ambulatory Visit: Payer: Self-pay | Admitting: Family Medicine

## 2023-12-01 ENCOUNTER — Other Ambulatory Visit (HOSPITAL_COMMUNITY): Payer: Self-pay

## 2023-12-01 DIAGNOSIS — E782 Mixed hyperlipidemia: Secondary | ICD-10-CM

## 2023-12-01 MED ORDER — LOSARTAN POTASSIUM 50 MG PO TABS
50.0000 mg | ORAL_TABLET | Freq: Every day | ORAL | 1 refills | Status: AC
  Filled 2023-12-01 – 2024-01-25 (×3): qty 90, 90d supply, fill #0
  Filled 2024-04-19: qty 90, 90d supply, fill #1

## 2023-12-03 ENCOUNTER — Other Ambulatory Visit (HOSPITAL_COMMUNITY): Payer: Self-pay

## 2023-12-03 ENCOUNTER — Ambulatory Visit: Payer: Self-pay | Admitting: Family Medicine

## 2023-12-22 ENCOUNTER — Other Ambulatory Visit (HOSPITAL_COMMUNITY): Payer: Self-pay

## 2023-12-22 ENCOUNTER — Other Ambulatory Visit: Payer: Self-pay | Admitting: Family Medicine

## 2023-12-22 ENCOUNTER — Other Ambulatory Visit: Payer: Self-pay

## 2023-12-22 DIAGNOSIS — E1165 Type 2 diabetes mellitus with hyperglycemia: Secondary | ICD-10-CM

## 2023-12-22 MED ORDER — GLIPIZIDE 5 MG PO TABS
5.0000 mg | ORAL_TABLET | Freq: Every evening | ORAL | 1 refills | Status: AC
  Filled 2023-12-22: qty 90, 90d supply, fill #0
  Filled 2024-03-23: qty 90, 90d supply, fill #1

## 2024-01-07 ENCOUNTER — Encounter: Payer: Self-pay | Admitting: Family Medicine

## 2024-01-14 ENCOUNTER — Telehealth: Payer: Self-pay | Admitting: Gastroenterology

## 2024-01-14 NOTE — Telephone Encounter (Signed)
 Patient called and stated that she was needing to change her GI due to [facility] not accepting [facility] under their insurance. Patient is having on and off symptoms of Constipation, Diarrhea, Nausea, vomiting. Patient stated that she is diagnosed with IBS and she is also needing a colonoscopy. Patient is wanting us  to request records for her. Patient is aware that we are mailing a medical release form in order to request those records.

## 2024-01-25 ENCOUNTER — Other Ambulatory Visit (HOSPITAL_COMMUNITY): Payer: Self-pay

## 2024-02-01 ENCOUNTER — Ambulatory Visit (INDEPENDENT_AMBULATORY_CARE_PROVIDER_SITE_OTHER): Admitting: Family Medicine

## 2024-02-01 ENCOUNTER — Encounter: Payer: Self-pay | Admitting: Family Medicine

## 2024-02-01 VITALS — BP 107/71 | HR 88 | Ht 64.0 in | Wt 200.0 lb

## 2024-02-01 DIAGNOSIS — Z0000 Encounter for general adult medical examination without abnormal findings: Secondary | ICD-10-CM | POA: Diagnosis not present

## 2024-02-01 NOTE — Progress Notes (Signed)
 Angela Casey - 63 y.o. female MRN [id number]  Date of birth: 28-Nov-1960  Subjective Chief Complaint  Patient presents with   Annual Exam    HPI Angela Casey is a 63 y.o. female here today for annual exam.   She reports that she is doing pretty well.   She has been working on dietary changes.  She is moderately active.    She is a non-smoker.  Occasional EtOH use.   She has regular dental care.   Review of Systems  Constitutional:  Negative for chills, fever, malaise/fatigue and weight loss.  HENT:  Negative for congestion, ear pain and sore throat.   Eyes:  Negative for blurred vision, double vision and pain.  Respiratory:  Negative for cough and shortness of breath.   Cardiovascular:  Negative for chest pain and palpitations.  Gastrointestinal:  Negative for abdominal pain, blood in stool, constipation, heartburn and nausea.  Genitourinary:  Negative for dysuria and urgency.  Musculoskeletal:  Negative for joint pain and myalgias.  Neurological:  Negative for dizziness and headaches.  Endo/Heme/Allergies:  Does not bruise/bleed easily.  Psychiatric/Behavioral:  Negative for depression. The patient is not nervous/anxious and does not have insomnia.     Allergies  Allergen Reactions   Livalo  [Pitavastatin ] Itching, Rash and Hives   Exenatide Nausea And Vomiting    diarrhea   Liraglutide      Victoza , Saxenda    Pollen Extract     Other reaction(s): sneezing   Tirzepatide  Diarrhea    Mounjaro    Januvia  [Sitagliptin ] Diarrhea    Past Medical History:  Diagnosis Date   Allergy    Arthritis    knees   Asthma    asthmatic bronchitis   Diabetes mellitus without complication (HCC)    GERD (gastroesophageal reflux disease)    Hypercholesteremia     Past Surgical History:  Procedure Laterality Date   ANTERIOR CRUCIATE LIGAMENT REPAIR Right    BACK SURGERY     L5-S1   CARPAL TUNNEL RELEASE     DILATION AND CURETTAGE, DIAGNOSTIC / THERAPEUTIC     KNEE  ARTHROSCOPY Left 06/28/2015   Procedure: Left Knee Arthroscopy and Debridement;  Surgeon: Glendia Cordella Hutchinson, MD;  Location: [facility];  Service: Orthopedics;  Laterality: Left;   KNEE ARTHROSCOPY WITH MEDIAL MENISECTOMY Left 06/28/2015   Procedure: KNEE ARTHROSCOPY WITH MEDIAL MENISECTOMY;  Surgeon: Glendia Cordella Hutchinson, MD;  Location: [facility];  Service: Orthopedics;  Laterality: Left;   KNEE SURGERY      Social History   Socioeconomic History   Marital status: Divorced    Spouse name: Not on file   Number of children: 1   Years of education: Not on file   Highest education level: Master's degree (e.g., MA, MS, MEng, MEd, MSW, MBA)  Occupational History   Not on file  Tobacco Use   Smoking status: Never   Smokeless tobacco: Never  Vaping Use   Vaping status: Never Used  Substance and Sexual Activity   Alcohol use: Yes    Alcohol/week: 0.0 - 1.0 standard drinks of alcohol    Comment: social   Drug use: No   Sexual activity: Not Currently    Partners: Male    Birth control/protection: Condom  Other Topics Concern   Not on file  Social History Narrative   Not on file   Social Drivers of Health   Financial Resource Strain: Low Risk  (11/22/2023)   Overall Financial Resource Strain (CARDIA)  Difficulty of Paying Living Expenses: Not hard at all  Food Insecurity: No Food Insecurity (11/22/2023)   Hunger Vital Sign    Worried About Running Out of Food in the Last Year: Never true    Ran Out of Food in the Last Year: Never true  Transportation Needs: No Transportation Needs (11/22/2023)   PRAPARE - Administrator, Civil Service (Medical): No    Lack of Transportation (Non-Medical): No  Physical Activity: Insufficiently Active (11/22/2023)   Exercise Vital Sign    Days of Exercise per Week: 3 days    Minutes of Exercise per Session: 30 min  Stress: No Stress Concern Present (11/22/2023)   [employer] of Occupational Health -  Occupational Stress Questionnaire    Feeling of Stress: Only a little  Social Connections: Moderately Isolated (11/22/2023)   Social Connection and Isolation Panel    Frequency of Communication with Friends and Family: Twice a week    Frequency of Social Gatherings with Friends and Family: Once a week    Attends Religious Services: More than 4 times per year    Active Member of [employer] or Organizations: No    Attends Engineer, structural: Not on file    Marital Status: Divorced    Family History  Problem Relation Age of Onset   Cancer Mother        uterine   Diabetes Father    COPD Father    Asthma Father    Diabetes Paternal Grandfather    Breast cancer Paternal Grandmother        in 60's   Heart attack Paternal Grandmother    Diabetes Paternal Grandmother    Hypertension Maternal Grandmother    Heart attack Maternal Grandmother    Allergic rhinitis Neg Hx    Angioedema Neg Hx    Atopy Neg Hx    Eczema Neg Hx    Immunodeficiency Neg Hx    Urticaria Neg Hx     Health Maintenance  Topic Date Due   HIV Screening  Never done   Hepatitis C Screening  Never done   MAMMOGRAM  11/29/2022   COVID-19 Vaccine (5 - 2024-25 season) 02/07/2023   INFLUENZA VACCINE  01/07/2024   OPHTHALMOLOGY EXAM  04/26/2024   Cervical Cancer Screening (HPV/Pap Cotest)  05/22/2024   FOOT EXAM  05/24/2024   HEMOGLOBIN A1C  05/24/2024   Diabetic kidney evaluation - eGFR measurement  11/22/2024   Diabetic kidney evaluation - Urine ACR  11/22/2024   Colonoscopy  11/25/2024   DTaP/Tdap/Td (2 - Td or Tdap) 10/09/2031   Pneumococcal Vaccine: 50+ Years  Completed   Zoster Vaccines- Shingrix   Completed   Hepatitis B Vaccines 19-59 Average Risk  Aged Out   HPV VACCINES  Aged Out   Meningococcal B Vaccine  Aged Out      ----------------------------------------------------------------------------------------------------------------------------------------------------------------------------------------------------------------- Physical Exam BP 107/71 (BP Location: Left Arm, Patient Position: Sitting, Cuff Size: Large)   Pulse 88   Ht 5' 4 (1.626 m)   Wt 200 lb (90.7 kg)   LMP 06/08/2010 (Within Months)   SpO2 98%   BMI 34.33 kg/m   Physical Exam Constitutional:      General: She is not in acute distress. HENT:     Head: Normocephalic and atraumatic.     Right Ear: Tympanic membrane and ear canal normal.     Left Ear: Tympanic membrane and ear canal normal.     Nose: Nose normal.  Eyes:     General:  No scleral icterus.    Conjunctiva/sclera: Conjunctivae normal.  Neck:     Thyroid : No thyromegaly.  Cardiovascular:     Rate and Rhythm: Normal rate and regular rhythm.     Heart sounds: Normal heart sounds.  Pulmonary:     Effort: Pulmonary effort is normal.     Breath sounds: Normal breath sounds.  Abdominal:     General: Bowel sounds are normal. There is no distension.     Palpations: Abdomen is soft.     Tenderness: There is no abdominal tenderness. There is no guarding.  Musculoskeletal:        General: Normal range of motion.     Cervical back: Normal range of motion and neck supple.  Lymphadenopathy:     Cervical: No cervical adenopathy.  Skin:    General: Skin is warm and dry.     Findings: No rash.  Neurological:     General: No focal deficit present.     Mental Status: She is alert and oriented to person, place, and time.     Cranial Nerves: No cranial nerve deficit.     Coordination: Coordination normal.  Psychiatric:        Mood and Affect: Mood normal.        Behavior: Behavior normal.      ------------------------------------------------------------------------------------------------------------------------------------------------------------------------------------------------------------------- Assessment and Plan  Well adult exam Well adult Screenings:  Mammogram is UTD Immunizations:  UTD Anticipatory guidance/Risk factor reduction:  Recommendations per AVS.     No orders of the defined types were placed in this encounter.   No follow-ups on file.

## 2024-02-01 NOTE — Assessment & Plan Note (Addendum)
 Well adult Screenings:  Mammogram is UTD Immunizations:  UTD Anticipatory guidance/Risk factor reduction:  Recommendations per AVS.

## 2024-02-01 NOTE — Patient Instructions (Signed)
 Preventive Care 58-62 Years Old, Female  Preventive care refers to lifestyle choices and visits with your health care provider that can promote health and wellness. Preventive care visits are also called wellness exams.  What can I expect for my preventive care visit?  Counseling  Your health care provider may ask you questions about your:  Medical history, including:  Past medical problems.  Family medical history.  Pregnancy history.  Current health, including:  Menstrual cycle.  Method of birth control.  Emotional well-being.  Home life and relationship well-being.  Sexual activity and sexual health.  Lifestyle, including:  Alcohol, nicotine or tobacco, and drug use.  Access to firearms.  Diet, exercise, and sleep habits.  Work and work Astronomer.  Sunscreen use.  Safety issues such as seatbelt and bike helmet use.  Physical exam  Your health care provider will check your:  Height and weight. These may be used to calculate your BMI (body mass index). BMI is a measurement that tells if you are at a healthy weight.  Waist circumference. This measures the distance around your waistline. This measurement also tells if you are at a healthy weight and may help predict your risk of certain diseases, such as type 2 diabetes and high blood pressure.  Heart rate and blood pressure.  Body temperature.  Skin for abnormal spots.  What immunizations do I need?    Vaccines are usually given at various ages, according to a schedule. Your health care provider will recommend vaccines for you based on your age, medical history, and lifestyle or other factors, such as travel or where you work.  What tests do I need?  Screening  Your health care provider may recommend screening tests for certain conditions. This may include:  Lipid and cholesterol levels.  Diabetes screening. This is done by checking your blood sugar (glucose) after you have not eaten for a while (fasting).  Pelvic exam and Pap test.  Hepatitis B test.  Hepatitis C  test.  HIV (human immunodeficiency virus) test.  STI (sexually transmitted infection) testing, if you are at risk.  Lung cancer screening.  Colorectal cancer screening.  Mammogram. Talk with your health care provider about when you should start having regular mammograms. This may depend on whether you have a family history of breast cancer.  BRCA-related cancer screening. This may be done if you have a family history of breast, ovarian, tubal, or peritoneal cancers.  Bone density scan. This is done to screen for osteoporosis.  Talk with your health care provider about your test results, treatment options, and if necessary, the need for more tests.  Follow these instructions at home:  Eating and drinking    Eat a diet that includes fresh fruits and vegetables, whole grains, lean protein, and low-fat dairy products.  Take vitamin and mineral supplements as recommended by your health care provider.  Do not drink alcohol if:  Your health care provider tells you not to drink.  You are pregnant, may be pregnant, or are planning to become pregnant.  If you drink alcohol:  Limit how much you have to 0-1 drink a day.  Know how much alcohol is in your drink. In the [place] one drink equals one 12 oz bottle of beer (355 mL), one 5 oz glass of wine (148 mL), or one 1 oz glass of hard liquor (44 mL).  Lifestyle  Brush your teeth every morning and night with fluoride toothpaste. Floss one time each day.  Exercise for at least  30 minutes 5 or more days each week.  Do not use any products that contain nicotine or tobacco. These products include cigarettes, chewing tobacco, and vaping devices, such as e-cigarettes. If you need help quitting, ask your health care provider.  Do not use drugs.  If you are sexually active, practice safe sex. Use a condom or other form of protection to prevent STIs.  If you do not wish to become pregnant, use a form of birth control. If you plan to become pregnant, see your health care provider for a  prepregnancy visit.  Take aspirin only as told by your health care provider. Make sure that you understand how much to take and what form to take. Work with your health care provider to find out whether it is safe and beneficial for you to take aspirin daily.  Find healthy ways to manage stress, such as:  Meditation, yoga, or listening to music.  Journaling.  Talking to a trusted person.  Spending time with friends and family.  Minimize exposure to UV radiation to reduce your risk of skin cancer.  Safety  Always wear your seat belt while driving or riding in a vehicle.  Do not drive:  If you have been drinking alcohol. Do not ride with someone who has been drinking.  When you are tired or distracted.  While texting.  If you have been using any mind-altering substances or drugs.  Wear a helmet and other protective equipment during sports activities.  If you have firearms in your house, make sure you follow all gun safety procedures.  Seek help if you have been physically or sexually abused.  What's next?  Visit your health care provider once a year for an annual wellness visit.  Ask your health care provider how often you should have your eyes and teeth checked.  Stay up to date on all vaccines.  This information is not intended to replace advice given to you by your health care provider. Make sure you discuss any questions you have with your health care provider.  Document Revised: 11/20/2020 Document Reviewed: 11/20/2020  Elsevier Patient Education  2024 [employer].

## 2024-02-08 ENCOUNTER — Encounter: Payer: Self-pay | Admitting: Sports Medicine

## 2024-02-16 ENCOUNTER — Other Ambulatory Visit (HOSPITAL_COMMUNITY): Payer: Self-pay

## 2024-02-22 ENCOUNTER — Other Ambulatory Visit: Payer: Self-pay | Admitting: Family Medicine

## 2024-02-22 ENCOUNTER — Other Ambulatory Visit (HOSPITAL_COMMUNITY): Payer: Self-pay

## 2024-02-22 ENCOUNTER — Other Ambulatory Visit: Payer: Self-pay

## 2024-02-22 DIAGNOSIS — E1165 Type 2 diabetes mellitus with hyperglycemia: Secondary | ICD-10-CM

## 2024-02-22 MED ORDER — EMPAGLIFLOZIN 25 MG PO TABS
25.0000 mg | ORAL_TABLET | Freq: Every day | ORAL | 1 refills | Status: AC
  Filled 2024-02-22: qty 90, 90d supply, fill #0
  Filled 2024-03-23: qty 90, 90d supply, fill #1
  Filled 2024-05-22: qty 90, 90d supply, fill #0
  Filled 2024-05-22: qty 90, 90d supply, fill #1

## 2024-03-23 ENCOUNTER — Other Ambulatory Visit: Payer: Self-pay | Admitting: Family Medicine

## 2024-03-23 ENCOUNTER — Other Ambulatory Visit: Payer: Self-pay

## 2024-03-23 ENCOUNTER — Other Ambulatory Visit (HOSPITAL_COMMUNITY): Payer: Self-pay

## 2024-03-23 MED ORDER — METFORMIN HCL ER 500 MG PO TB24
2000.0000 mg | ORAL_TABLET | Freq: Every day | ORAL | 1 refills | Status: AC
  Filled 2024-03-23: qty 360, 90d supply, fill #0
  Filled 2024-07-07: qty 360, 90d supply, fill #1

## 2024-03-24 ENCOUNTER — Ambulatory Visit: Admitting: Family Medicine

## 2024-04-19 ENCOUNTER — Other Ambulatory Visit (HOSPITAL_COMMUNITY): Payer: Self-pay

## 2024-05-15 DIAGNOSIS — H52213 Irregular astigmatism, bilateral: Secondary | ICD-10-CM | POA: Diagnosis not present

## 2024-05-15 DIAGNOSIS — H2513 Age-related nuclear cataract, bilateral: Secondary | ICD-10-CM | POA: Diagnosis not present

## 2024-05-15 DIAGNOSIS — E119 Type 2 diabetes mellitus without complications: Secondary | ICD-10-CM | POA: Diagnosis not present

## 2024-05-15 DIAGNOSIS — H04123 Dry eye syndrome of bilateral lacrimal glands: Secondary | ICD-10-CM | POA: Diagnosis not present

## 2024-05-15 DIAGNOSIS — H43811 Vitreous degeneration, right eye: Secondary | ICD-10-CM | POA: Diagnosis not present

## 2024-05-15 DIAGNOSIS — H5213 Myopia, bilateral: Secondary | ICD-10-CM | POA: Diagnosis not present

## 2024-05-15 DIAGNOSIS — H40013 Open angle with borderline findings, low risk, bilateral: Secondary | ICD-10-CM | POA: Diagnosis not present

## 2024-05-15 DIAGNOSIS — H524 Presbyopia: Secondary | ICD-10-CM | POA: Diagnosis not present

## 2024-05-15 LAB — OPHTHALMOLOGY REPORT-SCANNED

## 2024-05-17 ENCOUNTER — Encounter: Payer: Self-pay | Admitting: Family Medicine

## 2024-05-17 ENCOUNTER — Ambulatory Visit: Admitting: Family Medicine

## 2024-05-17 DIAGNOSIS — Z7984 Long term (current) use of oral hypoglycemic drugs: Secondary | ICD-10-CM | POA: Diagnosis not present

## 2024-05-17 DIAGNOSIS — E782 Mixed hyperlipidemia: Secondary | ICD-10-CM | POA: Diagnosis not present

## 2024-05-17 DIAGNOSIS — E119 Type 2 diabetes mellitus without complications: Secondary | ICD-10-CM | POA: Diagnosis not present

## 2024-05-17 DIAGNOSIS — E669 Obesity, unspecified: Secondary | ICD-10-CM

## 2024-05-17 DIAGNOSIS — R6889 Other general symptoms and signs: Secondary | ICD-10-CM | POA: Insufficient documentation

## 2024-05-17 DIAGNOSIS — I10 Essential (primary) hypertension: Secondary | ICD-10-CM | POA: Diagnosis not present

## 2024-05-17 LAB — POCT GLYCOSYLATED HEMOGLOBIN (HGB A1C): HbA1c, POC (controlled diabetic range): 8.2 % — AB (ref 0.0–7.0)

## 2024-05-17 NOTE — Assessment & Plan Note (Signed)
Blood pressure is well-controlled with losartan at current strength.  Recommend continuation.

## 2024-05-17 NOTE — Assessment & Plan Note (Signed)
 A1c improved but still not at goal.  Continue to track glucose and make changes to diet. Short term goal is to have A1c <8.  F/u in 6 months.

## 2024-05-17 NOTE — Progress Notes (Signed)
 IO DIEUJUSTE - 63 y.o. female MRN [id number]  Date of birth: 08-17-1960  Subjective Chief Complaint  Patient presents with   Diabetes   Hypertension    HPI Angela Casey is a 63 y.o. female here today for follow up visit.   She reports that she is doing pretty well.  Diabetes is managed with jardiance , glipizide , metformin  and Rybelsus .  A1c today is 8.2%.  Diet is improved.  She is tracking glucose and making adjustments to diet based on foods that increase her glucose.   Tolerating simvastatin  well at current strength for management of associated HLD.   BP remains well controlled with losartan  at current strength.  Denies chest pain, shortness of breath, palpitations, headache or vision changes.   ROS:  A comprehensive ROS was completed and negative except as noted per HPI  Allergies  Allergen Reactions   Livalo  [Pitavastatin ] Itching, Rash and Hives   Exenatide Nausea And Vomiting    diarrhea   Liraglutide      Victoza , Saxenda    Pollen Extract     Other reaction(s): sneezing   Tirzepatide  Diarrhea    Mounjaro    Januvia  [Sitagliptin ] Diarrhea    Past Medical History:  Diagnosis Date   Allergy 1984   Arthritis    knees   Asthma    asthmatic bronchitis   Diabetes mellitus without complication (HCC) 2010   GERD (gastroesophageal reflux disease)    Hypercholesteremia     Past Surgical History:  Procedure Laterality Date   ANTERIOR CRUCIATE LIGAMENT REPAIR Right    BACK SURGERY     L5-S1   CARPAL TUNNEL RELEASE     DILATION AND CURETTAGE, DIAGNOSTIC / THERAPEUTIC     KNEE ARTHROSCOPY Left 06/28/2015   Procedure: Left Knee Arthroscopy and Debridement;  Surgeon: Glendia Cordella Hutchinson, MD;  Location: [facility];  Service: Orthopedics;  Laterality: Left;   KNEE ARTHROSCOPY WITH MEDIAL MENISECTOMY Left 06/28/2015   Procedure: KNEE ARTHROSCOPY WITH MEDIAL MENISECTOMY;  Surgeon: Glendia Cordella Hutchinson, MD;  Location: [facility];  Service:  Orthopedics;  Laterality: Left;   KNEE SURGERY      Social History   Socioeconomic History   Marital status: Divorced    Spouse name: Not on file   Number of children: 1   Years of education: Not on file   Highest education level: Master's degree (e.g., MA, MS, MEng, MEd, MSW, MBA)  Occupational History   Not on file  Tobacco Use   Smoking status: Never   Smokeless tobacco: Never  Vaping Use   Vaping status: Never Used  Substance and Sexual Activity   Alcohol use: Yes    Alcohol/week: 0.0 - 1.0 standard drinks of alcohol    Comment: social   Drug use: No   Sexual activity: Not Currently    Partners: Male    Birth control/protection: Condom  Other Topics Concern   Not on file  Social History Narrative   Not on file   Social Drivers of Health   Financial Resource Strain: Low Risk  (05/16/2024)   Overall Financial Resource Strain (CARDIA)    Difficulty of Paying Living Expenses: Not hard at all  Food Insecurity: No Food Insecurity (05/16/2024)   Hunger Vital Sign    Worried About Running Out of Food in the Last Year: Never true    Ran Out of Food in the Last Year: Never true  Transportation Needs: No Transportation Needs (05/16/2024)   PRAPARE - Transportation  Lack of Transportation (Medical): No    Lack of Transportation (Non-Medical): No  Physical Activity: Insufficiently Active (05/16/2024)   Exercise Vital Sign    Days of Exercise per Week: 3 days    Minutes of Exercise per Session: 20 min  Stress: No Stress Concern Present (05/16/2024)   [employer] of Occupational Health - Occupational Stress Questionnaire    Feeling of Stress: Only a little  Social Connections: Moderately Isolated (05/16/2024)   Social Connection and Isolation Panel    Frequency of Communication with Friends and Family: Once a week    Frequency of Social Gatherings with Friends and Family: Once a week    Attends Religious Services: More than 4 times per year    Active Member of Clubs or  Organizations: Yes    Attends Engineer, Structural: More than 4 times per year    Marital Status: Divorced    Family History  Problem Relation Age of Onset   Cancer Mother        uterine   Diabetes Father    COPD Father    Asthma Father    Diabetes Paternal Grandfather    Breast cancer Paternal Grandmother        in 57's   Heart attack Paternal Grandmother    Diabetes Paternal Grandmother    Hypertension Maternal Grandmother    Heart attack Maternal Grandmother    Allergic rhinitis Neg Hx    Angioedema Neg Hx    Atopy Neg Hx    Eczema Neg Hx    Immunodeficiency Neg Hx    Urticaria Neg Hx     Health Maintenance  Topic Date Due   HIV Screening  Never done   Hepatitis C Screening  Never done   Mammogram  11/29/2022   Cervical Cancer Screening (HPV/Pap Cotest)  05/22/2024   COVID-19 Vaccine (5 - 2025-26 season) 06/02/2025 (Originally 02/07/2024)   FOOT EXAM  05/24/2024   HEMOGLOBIN A1C  11/15/2024   Diabetic kidney evaluation - eGFR measurement  11/22/2024   Diabetic kidney evaluation - Urine ACR  11/22/2024   Colonoscopy  11/25/2024   OPHTHALMOLOGY EXAM  05/15/2025   DTaP/Tdap/Td (2 - Td or Tdap) 10/09/2031   Pneumococcal Vaccine: 50+ Years  Completed   Influenza Vaccine  Completed   Zoster Vaccines- Shingrix   Completed   Hepatitis B Vaccines 19-59 Average Risk  Aged Out   HPV VACCINES  Aged Out   Meningococcal B Vaccine  Aged Out     ----------------------------------------------------------------------------------------------------------------------------------------------------------------------------------------------------------------- Physical Exam BP 108/71 (BP Location: Left Arm, Patient Position: Sitting, Cuff Size: Large)   Pulse 94   Ht 5' 4 (1.626 m)   Wt 198 lb (89.8 kg)   LMP 06/08/2010 (Within Months)   SpO2 98%   BMI 33.99 kg/m   Physical Exam Constitutional:      Appearance: Normal appearance.  Eyes:     General: No scleral  icterus. Cardiovascular:     Rate and Rhythm: Normal rate and regular rhythm.  Pulmonary:     Effort: Pulmonary effort is normal.     Breath sounds: Normal breath sounds.  Neurological:     Mental Status: She is alert.  Psychiatric:        Mood and Affect: Mood normal.        Behavior: Behavior normal.     ------------------------------------------------------------------------------------------------------------------------------------------------------------------------------------------------------------------- Assessment and Plan  Type 2 diabetes mellitus in patient with obesity (HCC) A1c improved but still not at goal.  Continue to track glucose and make  changes to diet. Short term goal is to have A1c <8.  F/u in 6 months.     Hyperlipidemia Tolerating simvastatin  well at current strength.  Will plan to continue this at current strength.  Essential hypertension Blood pressure is well-controlled with losartan  at current strength.  Recommend continuation.  Sensation of feeling cold Orders Placed This Encounter  Procedures   CBC with Differential/Platelet   Fe+TIBC+Fer   TSH      No orders of the defined types were placed in this encounter.   Return in about 6 months (around 11/15/2024) for Type 2 Diabetes.

## 2024-05-17 NOTE — Progress Notes (Signed)
 OB/GYN visit for Pap Smear & Mammogram on 05/18/2024.

## 2024-05-17 NOTE — Assessment & Plan Note (Signed)
 Orders Placed This Encounter  Procedures   CBC with Differential/Platelet   Fe+TIBC+Fer   TSH

## 2024-05-17 NOTE — Assessment & Plan Note (Signed)
Tolerating simvastatin well at current strength.  Will plan to continue this at current strength.

## 2024-05-18 LAB — CBC WITH DIFFERENTIAL/PLATELET
Basophils Absolute: 0.1 x10E3/uL (ref 0.0–0.2)
Basos: 1 %
EOS (ABSOLUTE): 0.4 x10E3/uL (ref 0.0–0.4)
Eos: 4 %
Hematocrit: 42 % (ref 34.0–46.6)
Hemoglobin: 13.5 g/dL (ref 11.1–15.9)
Immature Grans (Abs): 0 x10E3/uL (ref 0.0–0.1)
Immature Granulocytes: 0 %
Lymphocytes Absolute: 2.9 x10E3/uL (ref 0.7–3.1)
Lymphs: 34 %
MCH: 28.2 pg (ref 26.6–33.0)
MCHC: 32.1 g/dL (ref 31.5–35.7)
MCV: 88 fL (ref 79–97)
Monocytes Absolute: 0.8 x10E3/uL (ref 0.1–0.9)
Monocytes: 9 %
Neutrophils Absolute: 4.4 x10E3/uL (ref 1.4–7.0)
Neutrophils: 52 %
Platelets: 337 x10E3/uL (ref 150–450)
RBC: 4.78 x10E6/uL (ref 3.77–5.28)
RDW: 12.3 % (ref 11.7–15.4)
WBC: 8.5 x10E3/uL (ref 3.4–10.8)

## 2024-05-18 LAB — TSH: TSH: 3.41 u[IU]/mL (ref 0.450–4.500)

## 2024-05-18 LAB — IRON,TIBC AND FERRITIN PANEL
Ferritin: 22 ng/mL (ref 15–150)
Iron Saturation: 14 % — ABNORMAL LOW (ref 15–55)
Iron: 57 ug/dL (ref 27–139)
Total Iron Binding Capacity: 410 ug/dL (ref 250–450)
UIBC: 353 ug/dL (ref 118–369)

## 2024-05-19 ENCOUNTER — Ambulatory Visit: Payer: Self-pay | Admitting: Family Medicine

## 2024-05-22 ENCOUNTER — Other Ambulatory Visit: Payer: Self-pay

## 2024-05-22 ENCOUNTER — Other Ambulatory Visit (HOSPITAL_COMMUNITY): Payer: Self-pay

## 2024-05-22 DIAGNOSIS — Z01419 Encounter for gynecological examination (general) (routine) without abnormal findings: Secondary | ICD-10-CM | POA: Diagnosis not present

## 2024-05-22 DIAGNOSIS — Z1231 Encounter for screening mammogram for malignant neoplasm of breast: Secondary | ICD-10-CM | POA: Diagnosis not present

## 2024-05-22 DIAGNOSIS — Z1331 Encounter for screening for depression: Secondary | ICD-10-CM | POA: Diagnosis not present

## 2024-05-22 LAB — HM MAMMOGRAPHY

## 2024-05-23 ENCOUNTER — Other Ambulatory Visit: Payer: Self-pay

## 2024-05-24 ENCOUNTER — Ambulatory Visit: Admitting: Podiatry

## 2024-05-24 ENCOUNTER — Encounter: Payer: Self-pay | Admitting: Podiatry

## 2024-05-24 ENCOUNTER — Other Ambulatory Visit (HOSPITAL_COMMUNITY): Payer: Self-pay

## 2024-05-24 ENCOUNTER — Other Ambulatory Visit: Payer: Self-pay

## 2024-05-24 VITALS — Ht 64.0 in | Wt 198.0 lb

## 2024-05-24 DIAGNOSIS — G5761 Lesion of plantar nerve, right lower limb: Secondary | ICD-10-CM

## 2024-05-24 MED ORDER — BETAMETHASONE SOD PHOS & ACET 6 (3-3) MG/ML IJ SUSP
3.0000 mg | Freq: Once | INTRAMUSCULAR | Status: AC
  Administered 2024-05-24: 10:00:00 3 mg via INTRA_ARTICULAR

## 2024-05-24 MED FILL — Meloxicam Tab 15 MG: 15.0000 mg | ORAL | 60 days supply | Qty: 60 | Fill #0 | Status: AC

## 2024-05-24 MED FILL — Methylprednisolone Tab Therapy Pack 4 MG (21): ORAL | 6 days supply | Qty: 21 | Fill #0 | Status: AC

## 2024-05-24 NOTE — Progress Notes (Signed)
 Chief Complaint  Patient presents with   Foot Pain    Pt is here due to right foot nerve pain, she is wanting an injection into the foot for the pain.    HPI: 63 y.o. female presenting today for recurrence of right forefoot pain secondary to a Morton's neuroma.  Chronic onset.  She says the injections helped significantly with significant course of time.  Most recently pain is slowly come back the last few months.  Past Medical History:  Diagnosis Date   Allergy 1984   Arthritis    knees   Asthma    asthmatic bronchitis   Diabetes mellitus without complication (HCC) 2010   GERD (gastroesophageal reflux disease)    Hypercholesteremia    Past Surgical History:  Procedure Laterality Date   ANTERIOR CRUCIATE LIGAMENT REPAIR Right    BACK SURGERY     L5-S1   CARPAL TUNNEL RELEASE     DILATION AND CURETTAGE, DIAGNOSTIC / THERAPEUTIC     KNEE ARTHROSCOPY Left 06/28/2015   Procedure: Left Knee Arthroscopy and Debridement;  Surgeon: Glendia Cordella Hutchinson, MD;  Location: [facility];  Service: Orthopedics;  Laterality: Left;   KNEE ARTHROSCOPY WITH MEDIAL MENISECTOMY Left 06/28/2015   Procedure: KNEE ARTHROSCOPY WITH MEDIAL MENISECTOMY;  Surgeon: Glendia Cordella Hutchinson, MD;  Location: [facility];  Service: Orthopedics;  Laterality: Left;   KNEE SURGERY     Allergies  Allergen Reactions   Livalo  [Pitavastatin ] Itching, Rash and Hives   Exenatide Nausea And Vomiting    diarrhea   Liraglutide      Victoza , Saxenda    Pollen Extract     Other reaction(s): sneezing   Tirzepatide  Diarrhea    Mounjaro    Januvia  [Sitagliptin ] Diarrhea    Physical Exam: General: The patient is alert and oriented x3 in no acute distress.  Dermatology: Skin is warm, dry and supple bilateral lower extremities. Negative for open lesions or macerations.  Vascular: Palpable pedal pulses bilaterally. No edema or erythema noted. Capillary refill within normal limits.  Neurological:  Light touch and protective threshold grossly intact bilaterally.   Musculoskeletal Exam: Range of motion within normal limits to all pedal and ankle joints bilateral. Muscle strength 5/5 in all groups bilateral.  There is pain on palpation to the third interspace right foot with a positive Mulder sign.  Pain with lateral compression of the metatarsal heads also noted.  Assessment: 1.  Morton's neuroma right third interspace   Plan of Care:  -Patient evaluated.  The patient had very good success that seem to last for several months with her treatment regimen from last year.  We will continue to do the same as long as she continues to have lasting alleviation of her symptoms with the conservative treatment -Today we did discuss possible surgical neurectomy since she has been dealing with this for several months and years.  For now she would like to continue to pursue conservative treatment management -Injection 0.5 cc Celestone  Soluspan injected into the third interspace right foot -Prescription for Medrol  Dosepak -Continue wearing good supportive shoes and sneakers -Return to clinic as needed      Thresa EMERSON Sar, DPM [facility]  Dr. Thresa EMERSON Sar, DPM    [address]  [place], [place] [address]                Office [phone]  Fax [phone]

## 2024-07-07 ENCOUNTER — Other Ambulatory Visit: Payer: Self-pay

## 2024-07-07 ENCOUNTER — Other Ambulatory Visit (HOSPITAL_COMMUNITY): Payer: Self-pay

## 2024-11-22 ENCOUNTER — Ambulatory Visit: Admitting: Family Medicine
